# Patient Record
Sex: Female | Born: 1965 | Race: White | Hispanic: No | Marital: Married | Smoking: Never smoker
Health system: Southern US, Community
[De-identification: ages and names within clinical notes are randomized; demographics above are authoritative.]

## PROBLEM LIST (undated history)

## (undated) DIAGNOSIS — G4730 Sleep apnea, unspecified: Secondary | ICD-10-CM

## (undated) DIAGNOSIS — M1990 Unspecified osteoarthritis, unspecified site: Secondary | ICD-10-CM

## (undated) DIAGNOSIS — Z9989 Dependence on other enabling machines and devices: Secondary | ICD-10-CM

## (undated) DIAGNOSIS — E119 Type 2 diabetes mellitus without complications: Secondary | ICD-10-CM

## (undated) DIAGNOSIS — G43909 Migraine, unspecified, not intractable, without status migrainosus: Secondary | ICD-10-CM

## (undated) DIAGNOSIS — F419 Anxiety disorder, unspecified: Secondary | ICD-10-CM

## (undated) DIAGNOSIS — I10 Essential (primary) hypertension: Secondary | ICD-10-CM

## (undated) DIAGNOSIS — R011 Cardiac murmur, unspecified: Secondary | ICD-10-CM

## (undated) DIAGNOSIS — E785 Hyperlipidemia, unspecified: Secondary | ICD-10-CM

## (undated) DIAGNOSIS — G4733 Obstructive sleep apnea (adult) (pediatric): Secondary | ICD-10-CM

## (undated) HISTORY — DX: Sleep apnea, unspecified: G47.30

## (undated) HISTORY — PX: SHOULDER ARTHROSCOPY: SHX128

## (undated) HISTORY — PX: TONSILLECTOMY: SUR1361

## (undated) HISTORY — DX: Cardiac murmur, unspecified: R01.1

## (undated) HISTORY — PX: KNEE ARTHROSCOPY: SHX127

## (undated) HISTORY — PX: NASAL SEPTUM SURGERY: SHX37

## (undated) HISTORY — DX: Morbid (severe) obesity due to excess calories: E66.01

## (undated) HISTORY — DX: Hyperlipidemia, unspecified: E78.5

## (undated) HISTORY — DX: Anxiety disorder, unspecified: F41.9

## (undated) HISTORY — DX: Migraine, unspecified, not intractable, without status migrainosus: G43.909

## (undated) HISTORY — DX: Dependence on other enabling machines and devices: Z99.89

## (undated) HISTORY — DX: Obstructive sleep apnea (adult) (pediatric): G47.33

## (undated) HISTORY — DX: Essential (primary) hypertension: I10

---

## 2001-04-05 HISTORY — PX: COLONOSCOPY: SHX174

## 2001-04-05 HISTORY — PX: UPPER GASTROINTESTINAL ENDOSCOPY: SHX188

## 2012-04-02 ENCOUNTER — Emergency Department (INDEPENDENT_AMBULATORY_CARE_PROVIDER_SITE_OTHER)
Admission: EM | Admit: 2012-04-02 | Discharge: 2012-04-02 | Disposition: A | Discharge status: Home / Self Care | Payer: BC Managed Care – PPO | Source: Home / Self Care | Attending: Emergency Medicine | Admitting: Emergency Medicine

## 2012-04-02 ENCOUNTER — Encounter: Payer: Self-pay | Admitting: *Deleted

## 2012-04-02 ENCOUNTER — Emergency Department (INDEPENDENT_AMBULATORY_CARE_PROVIDER_SITE_OTHER): Payer: BC Managed Care – PPO

## 2012-04-02 DIAGNOSIS — S43402A Unspecified sprain of left shoulder joint, initial encounter: Secondary | ICD-10-CM

## 2012-04-02 DIAGNOSIS — X500XXA Overexertion from strenuous movement or load, initial encounter: Secondary | ICD-10-CM

## 2012-04-02 DIAGNOSIS — IMO0002 Reserved for concepts with insufficient information to code with codable children: Secondary | ICD-10-CM

## 2012-04-02 DIAGNOSIS — M25519 Pain in unspecified shoulder: Secondary | ICD-10-CM

## 2012-04-02 HISTORY — DX: Type 2 diabetes mellitus without complications: E11.9

## 2012-04-02 MED ORDER — HYDROCODONE-ACETAMINOPHEN 5-500 MG PO TABS: ORAL_TABLET | ORAL | Status: DC
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
## 2012-04-02 NOTE — ED Notes (Signed)
Patient c/o left shoulder pain states she injured it yesterday while lifting something heavy.

## 2015-07-24 DIAGNOSIS — H902 Conductive hearing loss, unspecified: Secondary | ICD-10-CM | POA: Diagnosis not present

## 2016-02-19 ENCOUNTER — Encounter: Payer: Self-pay | Admitting: Emergency Medicine

## 2016-02-19 ENCOUNTER — Emergency Department (INDEPENDENT_AMBULATORY_CARE_PROVIDER_SITE_OTHER)
Admission: EM | Admit: 2016-02-19 | Discharge: 2016-02-19 | Disposition: A | Discharge status: Home / Self Care | Payer: BLUE CROSS/BLUE SHIELD | Source: Home / Self Care | Attending: Family Medicine | Admitting: Family Medicine

## 2016-02-19 DIAGNOSIS — M25512 Pain in left shoulder: Secondary | ICD-10-CM | POA: Diagnosis not present

## 2016-02-19 HISTORY — DX: Unspecified osteoarthritis, unspecified site: M19.90

## 2016-02-19 MED ORDER — KETOROLAC TROMETHAMINE 60 MG/2ML IM SOLN
60.0000 mg | Freq: Once | INTRAMUSCULAR | Status: AC
  Administered 2016-02-19: 60 mg via INTRAMUSCULAR

## 2016-02-19 MED ORDER — OXYCODONE-ACETAMINOPHEN 5-325 MG PO TABS: ORAL_TABLET | ORAL | 0 refills | Status: DC

## 2016-02-19 MED ORDER — MELOXICAM 15 MG PO TABS: 15.0000 mg | ORAL_TABLET | Freq: Every day | ORAL | 0 refills | Status: DC

## 2016-02-19 MED ORDER — CYCLOBENZAPRINE HCL 10 MG PO TABS: ORAL_TABLET | ORAL | 0 refills | Status: DC

## 2016-02-19 NOTE — Discharge Instructions (Signed)
Apply ice pack for 20 to 30 minutes, 3 to 4 times daily  Continue until pain and swelling decrease.  Begin pendulum exercises several times daily to maintain range of motion.  Begin more advanced shoulder exercises as tolerated.
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
## 2016-05-20 ENCOUNTER — Encounter: Payer: Self-pay | Admitting: Emergency Medicine

## 2016-05-20 ENCOUNTER — Emergency Department (INDEPENDENT_AMBULATORY_CARE_PROVIDER_SITE_OTHER)
Admission: EM | Admit: 2016-05-20 | Discharge: 2016-05-20 | Disposition: A | Discharge status: Home / Self Care | Payer: BLUE CROSS/BLUE SHIELD | Source: Home / Self Care | Attending: Family Medicine | Admitting: Family Medicine

## 2016-05-20 DIAGNOSIS — J069 Acute upper respiratory infection, unspecified: Secondary | ICD-10-CM | POA: Diagnosis not present

## 2016-05-20 DIAGNOSIS — B9789 Other viral agents as the cause of diseases classified elsewhere: Secondary | ICD-10-CM | POA: Diagnosis not present

## 2016-05-20 DIAGNOSIS — J4521 Mild intermittent asthma with (acute) exacerbation: Secondary | ICD-10-CM

## 2016-05-20 MED ORDER — BENZONATATE 100 MG PO CAPS: 100.0000 mg | ORAL_CAPSULE | Freq: Three times a day (TID) | ORAL | 0 refills | Status: DC

## 2016-05-20 MED ORDER — PREDNISONE 20 MG PO TABS: ORAL_TABLET | ORAL | 0 refills | Status: DC

## 2016-05-20 MED ORDER — ALBUTEROL SULFATE HFA 108 (90 BASE) MCG/ACT IN AERS: 1.0000 | INHALATION_SPRAY | Freq: Four times a day (QID) | RESPIRATORY_TRACT | 0 refills | Status: DC | PRN

## 2016-05-20 NOTE — ED Provider Notes (Signed)
CSN: [id number]     Arrival date & time 05/20/16  1628 History   First MD Initiated Contact with Patient 05/20/16 1708     Chief Complaint  Patient presents with  . Cough  . Nasal Congestion   (Consider location/radiation/quality/duration/timing/severity/associated sxs/prior Treatment) HPI  Morgan Duran is a 51 y.o. female presenting to UC with c/o 1 day of nasal congestion and mildly productive but mainly dry hacking cough.  Reports "low grade fever" but denies body aches, n/v/d. Denies headache, sore throat or ear pain.  Pt notes a friend of hers was sick recently and dx with a URI.  Pt denies hx of asthma but has needed an inhaler when she gets sick with URIs so she did try a leftover inhaler with minimal relief. She also notes she took her husband's leftover amoxicillin from a dental infection but no relief.     Past Medical History:  Diagnosis Date  . Arthritis   . Diabetes mellitus without complication [facility])    Past Surgical History:  Procedure Laterality Date  . KNEE ARTHROSCOPY    . NASAL SEPTUM SURGERY    . SHOULDER ARTHROSCOPY    . TONSILLECTOMY     Family History  Problem Relation Age of Onset  . Emphysema Mother   . Diabetes Father    Social History  Substance Use Topics  . Smoking status: Former Research scientist (life sciences)  . Smokeless tobacco: Never Used  . Alcohol use Yes   OB History    No data available     Review of Systems  Constitutional: Positive for fever ("low grade" ). Negative for chills.  HENT: Positive for congestion. Negative for ear pain, sore throat, trouble swallowing and voice change.   Respiratory: Positive for cough and chest tightness. Negative for shortness of breath and wheezing.   Cardiovascular: Negative for chest pain and palpitations.  Gastrointestinal: Negative for abdominal pain, diarrhea, nausea and vomiting.  Musculoskeletal: Negative for arthralgias, back pain and myalgias.  Skin: Negative for rash.    Allergies  Patient has no known  allergies.  Home Medications   Prior to Admission medications   Medication Sig Start Date End Date Taking? Authorizing Provider  albuterol (PROVENTIL HFA;VENTOLIN HFA) 108 (90 Base) MCG/ACT inhaler Inhale 1-2 puffs into the lungs every 6 (six) hours as needed for wheezing or shortness of breath. 05/20/16   Noland Fordyce, PA-C  benzonatate (TESSALON) 100 MG capsule Take 1-2 capsules (100-200 mg total) by mouth every 8 (eight) hours. 05/20/16   Noland Fordyce, PA-C  cyclobenzaprine (FLEXERIL) 10 MG tablet Take one tab by mouth TID prn muscle spasm 02/19/16   Kandra Nicolas, MD  HYDROcodone-acetaminophen (VICODIN) 5-500 MG per tablet Take 1 or 2 every 4-6 hours as needed for severe pain 04/02/12   Jacqulyn Cane, MD  Insulin Detemir (LEVEMIR FLEXPEN [place]) Inject into the skin.    Historical Provider, MD  L-Methylfolate-B6-B12 (METANX PO) Take by mouth.    Historical Provider, MD  meloxicam (MOBIC) 15 MG tablet Take 1 tablet (15 mg total) by mouth daily. Take with food each morning 02/19/16   Kandra Nicolas, MD  metFORMIN (GLUCOPHAGE) 500 MG tablet Take 500 mg by mouth 2 (two) times daily with a meal.    Historical Provider, MD  metoCLOPramide (REGLAN) 5 MG tablet Take 5 mg by mouth 4 (four) times daily.    Historical Provider, MD  oxyCODONE-acetaminophen (ROXICET) 5-325 MG tablet Take one tab HS prn pain 02/19/16   Kandra Nicolas, MD  predniSONE (  DELTASONE) 20 MG tablet 2 po daily x 3 days 05/20/16   Noland Fordyce, PA-C   Meds Ordered and Administered this Visit  Medications - No data to display  BP 136/84 (BP Location: Left Arm)   Pulse 96   Temp 98 F (36.7 C) (Oral)   Resp 18   Ht 5\' 4"  (1.626 m)   Wt 275 lb (124.7 kg)   LMP 04/29/2016 (Approximate)   SpO2 98%   BMI 47.20 kg/m  No data found.   Physical Exam  Constitutional: She is oriented to person, place, and time. She appears well-developed and well-nourished. No distress.  Obese female sitting up on exam bed, intermittent hacking  cough but no respiratory distress. Cooperative during exam.  HENT:  Head: Normocephalic and atraumatic.  Eyes: EOM are normal.  Neck: Normal range of motion. Neck supple.  Cardiovascular: Normal rate and regular rhythm.   Pulmonary/Chest: Effort normal and breath sounds normal. No stridor. No respiratory distress. She has no wheezes. She has no rales.  Lungs: CTAB but dry hacking cough with deep inspiration.   Musculoskeletal: Normal range of motion.  Lymphadenopathy:    She has no cervical adenopathy.  Neurological: She is alert and oriented to person, place, and time.  Skin: Skin is warm and dry. She is not diaphoretic.  Psychiatric: She has a normal mood and affect. Her behavior is normal.  Nursing note and vitals reviewed.   Urgent Care Course     Procedures (including critical care time)  Labs Review Labs Reviewed - No data to display  Imaging Review No results found.    MDM   1. Viral URI with cough   2. Mild intermittent reactive airway disease with acute exacerbation    Pt c/o cough since yesterday. O2 Sat 98% on RA.  Afebrile. Lungs: CTAB Due to hx of reactive airway, duoneb and decadron given in UC. Pt states she feels mildly improved.  No evidence of underlying bacterial infection at this time. Doubt pneumonia at this time. Doubt influenza given lack of fever and body aches.  Rx: Prednisone 20mg  BID for 3 days, tessalon, and albuterol inhaler.  F/u with PCP next week if not improving. Discussed symptoms that warrant emergent care in the ED.     Noland Fordyce, PA-C 05/20/16 1746

## 2016-05-20 NOTE — ED Triage Notes (Signed)
Patient states congestion and cough started yesterday; has only had low grade fever. Took tylenol at noon today.

## 2016-05-22 ENCOUNTER — Telehealth: Payer: Self-pay | Admitting: Emergency Medicine

## 2016-05-22 NOTE — Telephone Encounter (Signed)
Spoke w/pt, states she is feeling somewhat better, has finished her steroids, still not 100% but giving it some more time.  Encouraged pt to continue to push fluids and rest.  Will f/u prn.  TMartin,CMA

## 2016-05-25 ENCOUNTER — Emergency Department (INDEPENDENT_AMBULATORY_CARE_PROVIDER_SITE_OTHER)
Admission: EM | Admit: 2016-05-25 | Discharge: 2016-05-25 | Disposition: A | Discharge status: Home / Self Care | Payer: BLUE CROSS/BLUE SHIELD | Source: Home / Self Care | Attending: Family Medicine | Admitting: Family Medicine

## 2016-05-25 ENCOUNTER — Encounter: Payer: Self-pay | Admitting: *Deleted

## 2016-05-25 ENCOUNTER — Emergency Department (INDEPENDENT_AMBULATORY_CARE_PROVIDER_SITE_OTHER): Payer: BLUE CROSS/BLUE SHIELD

## 2016-05-25 DIAGNOSIS — F410 Panic disorder [episodic paroxysmal anxiety] without agoraphobia: Secondary | ICD-10-CM

## 2016-05-25 DIAGNOSIS — R05 Cough: Secondary | ICD-10-CM | POA: Diagnosis not present

## 2016-05-25 DIAGNOSIS — R062 Wheezing: Secondary | ICD-10-CM

## 2016-05-25 DIAGNOSIS — J9801 Acute bronchospasm: Secondary | ICD-10-CM | POA: Diagnosis not present

## 2016-05-25 DIAGNOSIS — R0602 Shortness of breath: Secondary | ICD-10-CM

## 2016-05-25 MED ORDER — ALBUTEROL SULFATE HFA 108 (90 BASE) MCG/ACT IN AERS: 1.0000 | INHALATION_SPRAY | Freq: Four times a day (QID) | RESPIRATORY_TRACT | 0 refills | Status: DC | PRN

## 2016-05-25 MED ORDER — PREDNISONE 20 MG PO TABS
ORAL_TABLET | ORAL | 0 refills | Status: DC
Start: 2016-05-25 — End: 2016-05-31

## 2016-05-25 MED ORDER — METHYLPREDNISOLONE SODIUM SUCC 125 MG IJ SOLR
80.0000 mg | Freq: Once | INTRAMUSCULAR | Status: AC
  Administered 2016-05-25: 80 mg via INTRAMUSCULAR

## 2016-05-25 MED ORDER — LORAZEPAM 0.5 MG PO TABS: 0.5000 mg | ORAL_TABLET | Freq: Three times a day (TID) | ORAL | 0 refills | Status: DC | PRN

## 2016-05-25 MED ORDER — AZITHROMYCIN 250 MG PO TABS: 250.0000 mg | ORAL_TABLET | Freq: Every day | ORAL | 0 refills | Status: DC

## 2016-05-25 MED ORDER — IPRATROPIUM-ALBUTEROL 0.5-2.5 (3) MG/3ML IN SOLN
3.0000 mL | Freq: Four times a day (QID) | RESPIRATORY_TRACT | Status: DC
  Administered 2016-05-25: 3 mL via RESPIRATORY_TRACT

## 2016-05-25 NOTE — ED Triage Notes (Signed)
Pt c/o SOB after completing her meds from her last visit.

## 2016-05-25 NOTE — ED Provider Notes (Signed)
CSN: [id number]     Arrival date & time 05/25/16  1345 History   First MD Initiated Contact with Patient 05/25/16 1344     Chief Complaint  Patient presents with  . Shortness of Breath   (Consider location/radiation/quality/duration/timing/severity/associated sxs/prior Treatment) HPI  Morgan Duran is a 51 y.o. female presenting to UC with c/o gradually worsening cough, congestion, chest tightness, and wheeze. Pt was seen on 05/20/16 for 1 day of nasal congestion and mild intermittent cough.  Per medical records, pt had clear lungs but dry cough at the time.  She was discharged with prednisone and inhaler. She has completed the prednisone treatment but tried using the inhaler today w/o relief.  Denies fever. Denies n/v/d.   Pt noted to nursing staff her mother passed about about 2 year ago from stage 4 lung cancer.  Pt notes she had a CXR, which showed the cancer, mother died within 1 week.    PMH notes she is a former smoker. Pt states she only smoked some when she was a teenager.    Past Medical History:  Diagnosis Date  . Arthritis   . Diabetes mellitus without complication [facility])    Past Surgical History:  Procedure Laterality Date  . KNEE ARTHROSCOPY    . NASAL SEPTUM SURGERY    . SHOULDER ARTHROSCOPY    . TONSILLECTOMY     Family History  Problem Relation Age of Onset  . Emphysema Mother   . Diabetes Father    Social History  Substance Use Topics  . Smoking status: Former Research scientist (life sciences)  . Smokeless tobacco: Never Used  . Alcohol use Yes   OB History    No data available     Review of Systems  Constitutional: Negative for chills and fever.  HENT: Positive for congestion. Negative for ear pain, sore throat, trouble swallowing and voice change.   Respiratory: Positive for cough, chest tightness, shortness of breath and wheezing.   Cardiovascular: Negative for chest pain and palpitations.  Gastrointestinal: Negative for abdominal pain, diarrhea, nausea and vomiting.   Musculoskeletal: Negative for arthralgias, back pain and myalgias.  Skin: Negative for rash.  Psychiatric/Behavioral: The patient is nervous/anxious.     Allergies  Patient has no known allergies.  Home Medications   Prior to Admission medications   Medication Sig Start Date End Date Taking? Authorizing Provider  albuterol (PROVENTIL HFA;VENTOLIN HFA) 108 (90 Base) MCG/ACT inhaler Inhale 1-2 puffs into the lungs every 6 (six) hours as needed for wheezing or shortness of breath. 05/20/16   Noland Fordyce, PA-C  albuterol (PROVENTIL HFA;VENTOLIN HFA) 108 (90 Base) MCG/ACT inhaler Inhale 1-2 puffs into the lungs every 6 (six) hours as needed for wheezing or shortness of breath. 05/25/16   Noland Fordyce, PA-C  azithromycin (ZITHROMAX) 250 MG tablet Take 1 tablet (250 mg total) by mouth daily. Take first 2 tablets together, then 1 every day until finished. 05/25/16   Noland Fordyce, PA-C  cyclobenzaprine (FLEXERIL) 10 MG tablet Take one tab by mouth TID prn muscle spasm 02/19/16   Kandra Nicolas, MD  HYDROcodone-acetaminophen (VICODIN) 5-500 MG per tablet Take 1 or 2 every 4-6 hours as needed for severe pain 04/02/12   Jacqulyn Cane, MD  Insulin Detemir (LEVEMIR FLEXPEN [place]) Inject into the skin.    Historical Provider, MD  L-Methylfolate-B6-B12 (METANX PO) Take by mouth.    Historical Provider, MD  LORazepam (ATIVAN) 0.5 MG tablet Take 1 tablet (0.5 mg total) by mouth every 8 (eight) hours as needed for  anxiety. 05/25/16   Noland Fordyce, PA-C  meloxicam (MOBIC) 15 MG tablet Take 1 tablet (15 mg total) by mouth daily. Take with food each morning 02/19/16   Kandra Nicolas, MD  metFORMIN (GLUCOPHAGE) 500 MG tablet Take 500 mg by mouth 2 (two) times daily with a meal.    Historical Provider, MD  metoCLOPramide (REGLAN) 5 MG tablet Take 5 mg by mouth 4 (four) times daily.    Historical Provider, MD  oxyCODONE-acetaminophen (ROXICET) 5-325 MG tablet Take one tab HS prn pain 02/19/16   Kandra Nicolas, MD   predniSONE (DELTASONE) 20 MG tablet 3 tabs po day one, then 2 po daily x 4 days 05/25/16   Noland Fordyce, PA-C   Meds Ordered and Administered this Visit   Medications  ipratropium-albuterol (DUONEB) 0.5-2.5 (3) MG/3ML nebulizer solution 3 mL (3 mLs Nebulization Given 05/25/16 1409)  methylPREDNISolone sodium succinate (SOLU-MEDROL) 125 mg/2 mL injection 80 mg (80 mg Intramuscular Given 05/25/16 1427)    BP 132/85 (BP Location: Left Arm)   Pulse 108   Resp 18   LMP 04/29/2016 (Approximate)   SpO2 97%  No data found.   Physical Exam  Constitutional: She is oriented to person, place, and time. She appears well-developed and well-nourished. She appears distressed.  Tearful, crying and moaning out loud but able to speak in short sentences. Appears anxious.  HENT:  Head: Normocephalic and atraumatic.  Right Ear: Tympanic membrane normal.  Left Ear: Tympanic membrane normal.  Nose: Nose normal.  Mouth/Throat: Uvula is midline, oropharynx is clear and moist and mucous membranes are normal.  Eyes: EOM are normal.  Neck: Normal range of motion. Neck supple.  Cardiovascular: Normal rate and regular rhythm.   Pulmonary/Chest: Effort normal. No stridor. Tachypnea noted. She has wheezes. She has rhonchi.  Musculoskeletal: Normal range of motion.  Lymphadenopathy:    She has no cervical adenopathy.  Neurological: She is alert and oriented to person, place, and time.  Skin: Skin is warm and dry. She is not diaphoretic.  Psychiatric: Her behavior is normal. Her mood appears anxious ( crying).  Nursing note and vitals reviewed.   Urgent Care Course     Procedures (including critical care time)  Labs Review Labs Reviewed - No data to display  Imaging Review Dg Chest 2 View  Result Date: 05/25/2016 CLINICAL DATA:  Patient was seen 05/20/16 for SOB, congestion and cough and given meds. States she had severe episode of SOB and wheezing today. EXAM: CHEST  2 VIEW COMPARISON:  None.  FINDINGS: Normal mediastinum and cardiac silhouette. Normal pulmonary vasculature. No evidence of effusion, infiltrate, or pneumothorax. No acute bony abnormality. IMPRESSION: No acute cardiopulmonary process. Electronically Signed   By: Suzy Bouchard M.D.   On: 05/25/2016 14:38     MDM   1. Acute bronchospasm   2. Anxiety attack    Pt c/o SOB. Appears to be having an anxiety attack, however, wheeze and rhonchi also noted on exam. O2 Sat on RA 98-100%.   CXR: no acute findings.  BP and HR improved in UC after duoneb, Solumedrol, and rest.  Due to initial onset of symptoms with cough and congestion 1 week ago, will cover for atypical bacteria as well. Rx: Prednisone, azithromycin, albuterol inhaler and ativan 0.5mg  (6 tabs) encouraged to keep note of when/fi she takes and if any benefit to discuss during PCP f/u Monday 05/31/16.      Noland Fordyce, PA-C 05/25/16 [phone]

## 2016-05-25 NOTE — Discharge Instructions (Signed)
°  Ativan (lorazepam) is an anti-anxiety medication that you can take up to 3 times daily as needed for anxiety. Do not take more than prescribed. Do not drink alcohol while taking. Do not drive while taking as it can cause drowsiness.  Please let your primary care provider know on Monday if you have tried this new medication, what caused your anxiety to worsen and if the medication helped or not.  This will help guide her to make any adjustments to your overall treatment plan.

## 2016-05-26 ENCOUNTER — Telehealth: Payer: Self-pay | Admitting: Emergency Medicine

## 2016-05-26 NOTE — Telephone Encounter (Signed)
Inquired about patient's status; encourage them to call with questions/concerns.  

## 2016-05-31 ENCOUNTER — Ambulatory Visit (INDEPENDENT_AMBULATORY_CARE_PROVIDER_SITE_OTHER): Payer: BLUE CROSS/BLUE SHIELD | Admitting: Physician Assistant

## 2016-05-31 ENCOUNTER — Encounter: Payer: Self-pay | Admitting: Physician Assistant

## 2016-05-31 VITALS — BP 132/92 | HR 105 | Wt 262.0 lb

## 2016-05-31 DIAGNOSIS — E785 Hyperlipidemia, unspecified: Secondary | ICD-10-CM | POA: Diagnosis not present

## 2016-05-31 DIAGNOSIS — Z794 Long term (current) use of insulin: Secondary | ICD-10-CM

## 2016-05-31 DIAGNOSIS — N921 Excessive and frequent menstruation with irregular cycle: Secondary | ICD-10-CM | POA: Insufficient documentation

## 2016-05-31 DIAGNOSIS — E1169 Type 2 diabetes mellitus with other specified complication: Secondary | ICD-10-CM | POA: Diagnosis not present

## 2016-05-31 DIAGNOSIS — E1165 Type 2 diabetes mellitus with hyperglycemia: Secondary | ICD-10-CM | POA: Insufficient documentation

## 2016-05-31 DIAGNOSIS — F4322 Adjustment disorder with anxiety: Secondary | ICD-10-CM | POA: Diagnosis not present

## 2016-05-31 DIAGNOSIS — Z0000 Encounter for general adult medical examination without abnormal findings: Secondary | ICD-10-CM | POA: Diagnosis not present

## 2016-05-31 LAB — LIPID PANEL W/REFLEX DIRECT LDL
CHOL/HDL RATIO: 4.7 ratio (ref <5.0)
CHOLESTEROL: 200 mg/dL — AB (ref <200)
HDL: 43 mg/dL — AB (ref >50)
LDL-Cholesterol: 129 mg/dL — ABNORMAL HIGH
NON-HDL CHOLESTEROL (CALC): 157 mg/dL — AB (ref <130)
TRIGLYCERIDES: 165 mg/dL — AB (ref <150)

## 2016-05-31 LAB — COMPREHENSIVE METABOLIC PANEL
ALT: 30 U/L — ABNORMAL HIGH (ref 6–29)
AST: 17 U/L (ref 10–35)
Albumin: 3.8 g/dL (ref 3.6–5.1)
Alkaline Phosphatase: 68 U/L (ref 33–115)
BILIRUBIN TOTAL: 0.6 mg/dL (ref 0.2–1.2)
BUN: 21 mg/dL (ref 7–25)
CALCIUM: 9.2 mg/dL (ref 8.6–10.2)
CHLORIDE: 97 mmol/L — AB (ref 98–110)
CO2: 26 mmol/L (ref 20–31)
Creat: 0.78 mg/dL (ref 0.50–1.10)
GLUCOSE: 385 mg/dL — AB (ref 65–99)
POTASSIUM: 4.1 mmol/L (ref 3.5–5.3)
Sodium: 132 mmol/L — ABNORMAL LOW (ref 135–146)
Total Protein: 6.5 g/dL (ref 6.1–8.1)

## 2016-05-31 LAB — CBC
HEMATOCRIT: 39.9 % (ref 35.0–45.0)
Hemoglobin: 13.1 g/dL (ref 11.7–15.5)
MCH: 28.9 pg (ref 27.0–33.0)
MCHC: 32.8 g/dL (ref 32.0–36.0)
MCV: 88.1 fL (ref 80.0–100.0)
MPV: 9.7 fL (ref 7.5–12.5)
Platelets: 343 10*3/uL (ref 140–400)
RBC: 4.53 MIL/uL (ref 3.80–5.10)
RDW: 13.3 % (ref 11.0–15.0)
WBC: 11.6 10*3/uL — AB (ref 3.8–10.8)

## 2016-05-31 LAB — TSH: TSH: 1.58 mIU/L

## 2016-05-31 LAB — HEMOGLOBIN A1C
Hgb A1c MFr Bld: 11.1 % — ABNORMAL HIGH (ref <5.7)
MEAN PLASMA GLUCOSE: 272 mg/dL

## 2016-05-31 LAB — T4, FREE: Free T4: 1.2 ng/dL (ref 0.8–1.8)

## 2016-05-31 MED ORDER — LORAZEPAM 0.5 MG PO TABS: 0.5000 mg | ORAL_TABLET | Freq: Three times a day (TID) | ORAL | 0 refills | Status: DC | PRN

## 2016-05-31 NOTE — Progress Notes (Signed)
HPI:                                                                Morgan Duran is a 51 y.o. female who presents to [facility] today to establish care   Current Concerns include anxiety, menopause  Patient reports that she has been having anxiety and panic attacks for the last 2 weeks. This is a new problem for her. She states that it has been triggered by her daughter's recent autism diagnosis. Her daughter's performance in school has dropped and she feels helpless despite efforts to help her keep up with the workload. She denies anhedonia or depressed mood. She denies suicidal thinking. She was seen in urgent care over the weekend and prescribed Ativan. She states this is helping.   States she is having 2 periods per month x 7 days each. She states her mother went through early menopause. She is wondering if her hormones are contributing to her anxiety.   DMII: prescribed Levemir and Metformin, but not currently taking either. She states she does not want to be dependent on medications, and she has recently started dieting and exercising. Checks blood sugars at home. Blood sugar range in the 300's. Denies polyuria, vision problems, and lightheadness. She does endorse peripheral neuropathy. Denies open sores/wounds on her feet. Denies hypoglycemic events.    Health Maintenance Health Maintenance  Topic Date Due  . HIV Screening  08/07/1981  . TETANUS/TDAP  08/07/1985  . PAP SMEAR  08/08/1987  . INFLUENZA VACCINE  11/04/2015    Past Medical History:  Diagnosis Date  . Arthritis   . Diabetes mellitus without complication [facility])    Past Surgical History:  Procedure Laterality Date  . KNEE ARTHROSCOPY    . NASAL SEPTUM SURGERY    . SHOULDER ARTHROSCOPY    . TONSILLECTOMY     Social History  Substance Use Topics  . Smoking status: Former Research scientist (life sciences)  . Smokeless tobacco: Never Used  . Alcohol use Yes   family history includes  Diabetes in her father; Emphysema in her mother.  ROS: negative except as noted in the HPI  Medications: Current Outpatient Prescriptions  Medication Sig Dispense Refill  . azithromycin (ZITHROMAX) 250 MG tablet Take 1 tablet (250 mg total) by mouth daily. Take first 2 tablets together, then 1 every day until finished. 6 tablet 0  . LORazepam (ATIVAN) 0.5 MG tablet Take 1 tablet (0.5 mg total) by mouth every 8 (eight) hours as needed for anxiety. 6 tablet 0  . metFORMIN (GLUCOPHAGE) 500 MG tablet Take 500 mg by mouth 2 (two) times daily with a meal.    . predniSONE (DELTASONE) 20 MG tablet 3 tabs po day one, then 2 po daily x 4 days 11 tablet 0  . Insulin Detemir (LEVEMIR FLEXPEN [place]) Inject into the skin.    Marland Kitchen L-Methylfolate-B6-B12 (METANX PO) Take by mouth.     No current facility-administered medications for this visit.    No Known Allergies     Objective:  BP (!) 132/92   Pulse (!) 105   Wt 262 lb (118.8 kg)   BMI 44.97 kg/m  Gen: well-groomed, cooperative, obese, not ill-appearing, no acute distress Pulm: Normal work of breathing, clear to auscultation  bilaterally CV: Tachycardic, regular rhythm, s1 and s2 distinct, no murmurs, clicks or rubs appreciated on this exam, no carotid bruit Neuro: alert and oriented x 3, EOM's intact MSK: normal gait and station, no peripheral edema Skin: warm and dry, no rashes or lesions on exposed skin Psych: anxious affect, very tearful throughout the visit, normal speech and thought content   No results found for this or any previous visit (from the past 72 hour(s)). No results found.    Assessment and Plan: 51 y.o. female with   1. Encounter for preventative adult health care examination - CBC - Comprehensive metabolic panel - Hemoglobin A1c - Lipid Panel w/reflex Direct LDL   2. Metrorrhagia - Follicle stimulating hormone - Luteinizing hormone - hCG, quantitative, pregnancy - Prolactin  3. Adjustment disorder with anxious  mood - patient declined to complete PHQ9/GAD. Declined SSRI therapy - TSH - T4, free - LORazepam (ATIVAN) 0.5 MG tablet; Take 1 tablet (0.5 mg total) by mouth every 8 (eight) hours as needed for anxiety.  Dispense: 30 tablet; Refill: 0 - Ambulatory referral to Psychiatry  4. Type 2 diabetes mellitus with hyperglycemia, with long-term current use of insulin (HCC) - restart Metformin 500mg  bid - discontinue prednisone - follow-up in 3 weeks   No orders of the defined types were placed in this encounter.    Patient education and anticipatory guidance given Patient agrees with treatment plan Follow-up in 3 weeks or sooner as needed  Darlyne Russian PA-C

## 2016-05-31 NOTE — Patient Instructions (Addendum)
Stop your prednisone Re-start your Metformin I've placed a referral for counseling downstairs. They will contact you shortly Come back to see me in the next 2-3 weeks for your diabetes follow-up

## 2016-06-01 LAB — HCG, QUANTITATIVE, PREGNANCY: hCG, Beta Chain, Quant, S: <2 m[IU]/mL

## 2016-06-01 LAB — LUTEINIZING HORMONE: LH: 2.3 m[IU]/mL

## 2016-06-01 LAB — FOLLICLE STIMULATING HORMONE: FSH: 1.6 m[IU]/mL

## 2016-06-01 LAB — PROLACTIN: Prolactin: 7.1 ng/mL

## 2016-06-02 MED ORDER — DAPAGLIFLOZIN PRO-METFORMIN ER 10-1000 MG PO TB24: 10.0000 mg | ORAL_TABLET | Freq: Every day | ORAL | 3 refills | Status: DC

## 2016-06-02 MED ORDER — ATORVASTATIN CALCIUM 20 MG PO TABS: 20.0000 mg | ORAL_TABLET | Freq: Every day | ORAL | 3 refills | Status: DC

## 2016-06-02 MED ORDER — EXENATIDE ER 2 MG/0.85ML ~~LOC~~ AUIJ: 2.0000 mg | AUTO-INJECTOR | SUBCUTANEOUS | 0 refills | Status: DC

## 2016-06-02 NOTE — Progress Notes (Signed)
Patient's labs show pseudohypernatremia and hyperglycemia. Need for better glucose control.  Lab Results  Component Value Date   NA 132 (L) 05/31/2016   K 4.1 05/31/2016   CL 97 (L) 05/31/2016   CO2 26 05/31/2016    Starting Xigduo daily and Bydureon weekly. Follow-up within the month.

## 2016-06-02 NOTE — Addendum Note (Signed)
Addended by: Nelson Chimes E on: 06/02/2016 12:15 PM   Modules accepted: Orders

## 2016-06-02 NOTE — Addendum Note (Signed)
Addended by: Nelson Chimes E on: 06/02/2016 01:09 PM   Modules accepted: Orders

## 2016-06-03 ENCOUNTER — Telehealth: Payer: Self-pay

## 2016-06-03 NOTE — Telephone Encounter (Signed)
Pre Authorization was sent to Cover My Meds. Key: [id number]. Called pharmacy and Rx is ready for pick up. Pt informed. Pt stated that she already has metformin 500 mg 2 x daily and Insulin. Pt stated she was gonna take what she already had because she did not want to waste money. Pt advised to take what she was prescribed by the provider. Pt stated she was just going to use what she had.

## 2016-06-03 NOTE — Progress Notes (Signed)
She is not in menopause. Her FSH, Lh, prolactin, and beta-hcg are all within normal limits. I recommend she call [facility] and see if she can get an appointment this week for counseling [phone]

## 2016-06-04 ENCOUNTER — Other Ambulatory Visit: Payer: Self-pay

## 2016-06-04 DIAGNOSIS — E1169 Type 2 diabetes mellitus with other specified complication: Secondary | ICD-10-CM

## 2016-06-04 DIAGNOSIS — E785 Hyperlipidemia, unspecified: Principal | ICD-10-CM

## 2016-06-04 MED ORDER — ATORVASTATIN CALCIUM 20 MG PO TABS: 20.0000 mg | ORAL_TABLET | Freq: Every day | ORAL | 3 refills | Status: DC

## 2016-06-04 NOTE — Telephone Encounter (Signed)
That's fine. Please let her know she still needs to see the diabetes educator because her blood sugar is over 300 and her A1C is over 11. She should make a nurse visit to make sure she knows how to use her insulin properly.

## 2016-06-04 NOTE — Telephone Encounter (Signed)
Pt notified of recommendations below.  She verbalized understanding and was transferred to scheduling.

## 2016-06-11 ENCOUNTER — Ambulatory Visit: Payer: BLUE CROSS/BLUE SHIELD

## 2016-06-21 ENCOUNTER — Ambulatory Visit: Payer: BLUE CROSS/BLUE SHIELD | Admitting: Physician Assistant

## 2016-06-29 ENCOUNTER — Ambulatory Visit (HOSPITAL_COMMUNITY): Payer: BLUE CROSS/BLUE SHIELD | Admitting: Psychiatry

## 2016-09-16 ENCOUNTER — Telehealth: Payer: Self-pay

## 2016-09-16 NOTE — Telephone Encounter (Signed)
-----   Message from [facility] [place] sent at 09/15/2016  4:53 PM EDT ----- For Diabetes

## 2016-09-16 NOTE — Telephone Encounter (Signed)
Left vm with recommendations -EH/RMA

## 2016-10-04 ENCOUNTER — Ambulatory Visit (INDEPENDENT_AMBULATORY_CARE_PROVIDER_SITE_OTHER): Payer: BLUE CROSS/BLUE SHIELD | Admitting: Physician Assistant

## 2016-10-04 ENCOUNTER — Encounter: Payer: Self-pay | Admitting: Physician Assistant

## 2016-10-04 VITALS — BP 136/87 | HR 80 | Temp 98.3°F | Ht 64.0 in | Wt 270.0 lb

## 2016-10-04 DIAGNOSIS — Z794 Long term (current) use of insulin: Secondary | ICD-10-CM

## 2016-10-04 DIAGNOSIS — Z0189 Encounter for other specified special examinations: Secondary | ICD-10-CM

## 2016-10-04 DIAGNOSIS — G43009 Migraine without aura, not intractable, without status migrainosus: Secondary | ICD-10-CM

## 2016-10-04 DIAGNOSIS — Z9114 Patient's other noncompliance with medication regimen: Secondary | ICD-10-CM

## 2016-10-04 DIAGNOSIS — E1165 Type 2 diabetes mellitus with hyperglycemia: Secondary | ICD-10-CM

## 2016-10-04 LAB — POCT URINALYSIS DIPSTICK
Bilirubin, UA: NEGATIVE
Blood, UA: NEGATIVE
Glucose, UA: >1000
Ketones, UA: NEGATIVE
LEUKOCYTES UA: NEGATIVE
NITRITE UA: NEGATIVE
PH UA: 5.5 (ref 5.0–8.0)
PROTEIN UA: NEGATIVE
Spec Grav, UA: 1.015 (ref 1.010–1.025)
Urobilinogen, UA: 0.2 E.U./dL

## 2016-10-04 LAB — COMPLETE METABOLIC PANEL WITH GFR
ALT: 25 U/L (ref 6–29)
AST: 17 U/L (ref 10–35)
Albumin: 4 g/dL (ref 3.6–5.1)
Alkaline Phosphatase: 91 U/L (ref 33–130)
BUN: 14 mg/dL (ref 7–25)
CO2: 26 mmol/L (ref 20–31)
CREATININE: 0.62 mg/dL (ref 0.50–1.05)
Calcium: 9.3 mg/dL (ref 8.6–10.4)
Chloride: 100 mmol/L (ref 98–110)
GFR, Est African American: >89 mL/min (ref >=60)
GFR, Est Non African American: >89 mL/min (ref >=60)
Glucose, Bld: 219 mg/dL — ABNORMAL HIGH (ref 65–99)
Potassium: 4.2 mmol/L (ref 3.5–5.3)
Sodium: 135 mmol/L (ref 135–146)
Total Bilirubin: 0.4 mg/dL (ref 0.2–1.2)
Total Protein: 6.7 g/dL (ref 6.1–8.1)

## 2016-10-04 MED ORDER — METOCLOPRAMIDE HCL 5 MG/ML IJ SOLN
10.0000 mg | Freq: Once | INTRAMUSCULAR | Status: AC
  Administered 2016-10-04: 10 mg via INTRAMUSCULAR

## 2016-10-04 MED ORDER — DEXAMETHASONE SODIUM PHOSPHATE 4 MG/ML IJ SOLN
4.0000 mg | Freq: Once | INTRAMUSCULAR | Status: AC
  Administered 2016-10-04: 4 mg via INTRAMUSCULAR

## 2016-10-04 MED ORDER — METOCLOPRAMIDE HCL 10 MG PO TABS: ORAL_TABLET | ORAL | 0 refills | Status: DC

## 2016-10-04 MED ORDER — RIZATRIPTAN BENZOATE 5 MG PO TBDP: 5.0000 mg | ORAL_TABLET | ORAL | 0 refills | Status: DC | PRN

## 2016-10-04 NOTE — Progress Notes (Signed)
HPI:                                                                Morgan Duran is a 51 y.o. female who presents to [facility] [place] today for headache  Onset: reports 2 last week, 1 Saturday and latest headache today Location: frontal behind her eyes Duration: waxing and waning Character: throbbing Associated symptoms: photophobia, sensitivity to smell, fatigue, nausea Relieving factors: sleep helps Treatments tried: nothing Reports she has been on Reglan in the past that was prescribed by a neurologist in [place]. She states she also tried anti-seizure medication, Imitrex and that did not work.   Patient is also "concerned about my kidneys" and requested a urinalysis. Denies flank pain, hematuria, frequency, urgency.  Past Medical History:  Diagnosis Date  . Arthritis   . Diabetes mellitus without complication [facility])    Past Surgical History:  Procedure Laterality Date  . KNEE ARTHROSCOPY    . NASAL SEPTUM SURGERY    . SHOULDER ARTHROSCOPY    . TONSILLECTOMY     Social History  Substance Use Topics  . Smoking status: Former Research scientist (life sciences)  . Smokeless tobacco: Never Used  . Alcohol use Yes   family history includes Diabetes in her father; Emphysema in her mother.  ROS: negative except as noted in the HPI  Medications: Current Outpatient Prescriptions  Medication Sig Dispense Refill  . atorvastatin (LIPITOR) 20 MG tablet Take 1 tablet (20 mg total) by mouth daily. 90 tablet 3  . azithromycin (ZITHROMAX) 250 MG tablet Take 1 tablet (250 mg total) by mouth daily. Take first 2 tablets together, then 1 every day until finished. 6 tablet 0  . Dapagliflozin-Metformin HCl ER (XIGDUO XR) 01-999 MG TB24 Take 10-1,000 mg by mouth daily. 30 tablet 3  . Exenatide ER (BYDUREON BCISE) 2 MG/0.85ML AUIJ Inject 2 mg into the skin once a week. 4 pen 0  . Insulin Detemir (LEVEMIR FLEXPEN [place]) Inject into the skin.    Marland Kitchen L-Methylfolate-B6-B12 (METANX PO)  Take by mouth.    Marland Kitchen LORazepam (ATIVAN) 0.5 MG tablet Take 1 tablet (0.5 mg total) by mouth every 8 (eight) hours as needed for anxiety. 30 tablet 0   No current facility-administered medications for this visit.    No Known Allergies     Objective:  BP 136/87   Pulse 80   Ht 5\' 4"  (1.626 m)   Wt 270 lb (122.5 kg)   BMI 46.35 kg/m  Gen: well-groomed, not ill-appearing, morbidly obese, no acute distress HEENT: head normocephalic, atraumatic; normal conjunctiva Pulm: Normal work of breathing, normal phonation, clear to auscultation bilaterally CV: Normal rate, regular rhythm, s1 and s2 distinct, no murmurs, clicks or rubs Neuro:  cranial nerves II-XII intact, no nystagmus, no papilledema, normal finger-to-nose, normal heel-to-shin, negative pronator drift, normal coordination, DTR's intact, normal tone, no tremor MSK: strength 5/5 and symmetric in bilateral upper and lower extremities, normal gait and station Mental Status: alert and oriented x 3, normal speech, cooperative, organized thought content   Results for orders placed or performed in visit on 10/04/16 (from the past 72 hour(s))  POCT Urinalysis Dipstick     Status: None   Collection Time: 10/04/16  3:05 PM  Result Value Ref Range  Color, UA YELLOW    Clarity, UA CLEAR    Glucose, UA >1,000    Bilirubin, UA NEGATIVE    Ketones, UA NEGATIVE    Spec Grav, UA 1.015 1.010 - 1.025   Blood, UA NEGATIVE    pH, UA 5.5 5.0 - 8.0   Protein, UA NEGATIVE    Urobilinogen, UA 0.2 0.2 or 1.0 E.U./dL   Nitrite, UA NEGATIVE    Leukocytes, UA Negative Negative   No results found.    Assessment and Plan: 51 y.o. female with   1. Patient request for diagnostic testing - POCT Urinalysis Dipstick negative  2. Migraine without aura and without status migrainosus, not intractable - reassuring neurologic exam, no focal deficits - patient given IM Decadron and Reglan with improvement of pain - patient given Maxalt and Reglan PO  for abortive therapy - will follow-up with Neurology - rizatriptan (MAXALT-MLT) 5 MG disintegrating tablet; Take 1 tablet (5 mg total) by mouth as needed for migraine. May repeat in 2 hours if needed  Dispense: 10 tablet; Refill: 0 - dexamethasone (DECADRON) injection 4 mg; Inject 1 mL (4 mg total) into the muscle once. - metoCLOPramide (REGLAN) 10 mg in dextrose 5 % 50 mL IVPB; Inject 10 mg into the muscle once.  3. Uncontrolled Type 2 diabetes mellitus with hyperglycemia, with long-term current use of insulin (HCC) - uncontrolled secondary to patient noncompliance. She did not fill Bydureon or Xigduo.  - COMPLETE METABOLIC PANEL WITH GFR - Hemoglobin A1c  Patient education and anticipatory guidance given Patient agrees with treatment plan Follow-up as needed if symptoms worsen or fail to improve  Darlyne Russian PA-C

## 2016-10-04 NOTE — Patient Instructions (Addendum)
- Reglan 1 tab every 8 hours as needed for nausea/migraine - Dissolve 1 Maxalt under the tongue at the first sign of headache. May repeat once if headache does not resolve or returns. - Follow-up with Headache clinic - Return or go to the ER for new or worsening symptoms    Migraine Headache A migraine headache is an intense, throbbing pain on one side or both sides of the head. Migraines may also cause other symptoms, such as nausea, vomiting, and sensitivity to light and noise. What are the causes? Doing or taking certain things may also trigger migraines, such as:  Alcohol.  Smoking.  Medicines, such as: ? Medicine used to treat chest pain (nitroglycerine). ? Birth control pills. ? Estrogen pills. ? Certain blood pressure medicines.  Aged cheeses, chocolate, or caffeine.  Foods or drinks that contain nitrates, glutamate, aspartame, or tyramine.  Physical activity.  Other things that may trigger a migraine include:  Menstruation.  Pregnancy.  Hunger.  Stress, lack of sleep, too much sleep, or fatigue.  Weather changes.  What increases the risk? The following factors may make you more likely to experience migraine headaches:  Age. Risk increases with age.  Family history of migraine headaches.  Being Caucasian.  Depression and anxiety.  Obesity.  Being a woman.  Having a hole in the heart (patent foramen ovale) or other heart problems.  What are the signs or symptoms? The main symptom of this condition is pulsating or throbbing pain. Pain may:  Happen in any area of the head, such as on one side or both sides.  Interfere with daily activities.  Get worse with physical activity.  Get worse with exposure to bright lights or loud noises.  Other symptoms may include:  Nausea.  Vomiting.  Dizziness.  General sensitivity to bright lights, loud noises, or smells.  Before you get a migraine, you may get warning signs that a migraine is developing  (aura). An aura may include:  Seeing flashing lights or having blind spots.  Seeing bright spots, halos, or zigzag lines.  Having tunnel vision or blurred vision.  Having numbness or a tingling feeling.  Having trouble talking.  Having muscle weakness.  How is this diagnosed? A migraine headache can be diagnosed based on:  Your symptoms.  A physical exam.  Tests, such as CT scan or MRI of the head. These imaging tests can help rule out other causes of headaches.  Taking fluid from the spine (lumbar puncture) and analyzing it (cerebrospinal fluid analysis, or CSF analysis).  How is this treated? A migraine headache is usually treated with medicines that:  Relieve pain.  Relieve nausea.  Prevent migraines from coming back.  Treatment may also include:  Acupuncture.  Lifestyle changes like avoiding foods that trigger migraines.  Follow these instructions at home: Medicines  Take over-the-counter and prescription medicines only as told by your health care provider.  Do not drive or use heavy machinery while taking prescription pain medicine.  To prevent or treat constipation while you are taking prescription pain medicine, your health care provider may recommend that you: ? Drink enough fluid to keep your urine clear or pale yellow. ? Take over-the-counter or prescription medicines. ? Eat foods that are high in fiber, such as fresh fruits and vegetables, whole grains, and beans. ? Limit foods that are high in fat and processed sugars, such as fried and sweet foods. Lifestyle  Avoid alcohol use.  Do not use any products that contain nicotine or tobacco,  such as cigarettes and e-cigarettes. If you need help quitting, ask your health care provider.  Get at least 8 hours of sleep every night.  Limit your stress. General instructions   Keep a journal to find out what may trigger your migraine headaches. For example, write down: ? What you eat and drink. ? How  much sleep you get. ? Any change to your diet or medicines.  If you have a migraine: ? Avoid things that make your symptoms worse, such as bright lights. ? It may help to lie down in a dark, quiet room. ? Do not drive or use heavy machinery. ? Ask your health care provider what activities are safe for you while you are experiencing symptoms.  Keep all follow-up visits as told by your health care provider. This is important. Contact a health care provider if:  You develop symptoms that are different or more severe than your usual migraine symptoms. Get help right away if:  Your migraine becomes severe.  You have a fever.  You have a stiff neck.  You have vision loss.  Your muscles feel weak or like you cannot control them.  You start to lose your balance often.  You develop trouble walking.  You faint. This information is not intended to replace advice given to you by your health care provider. Make sure you discuss any questions you have with your health care provider. Document Released: 03/22/2005 Document Revised: 10/10/2015 Document Reviewed: 09/08/2015 Elsevier Interactive Patient Education  2017 [employer].

## 2016-10-05 LAB — HEMOGLOBIN A1C
Hgb A1c MFr Bld: 10.2 % — ABNORMAL HIGH (ref <5.7)
Mean Plasma Glucose: 246 mg/dL

## 2016-10-05 NOTE — Progress Notes (Signed)
A1C is a little better, but diabetes is still uncontrolled Please let patient know it's very important that she schedule a follow-up appointment with me for her diabetes If she does not follow-up in 3 months, unfortunately I am not going to continue to be able to be her PCP

## 2016-10-10 DIAGNOSIS — Z9114 Patient's other noncompliance with medication regimen: Secondary | ICD-10-CM | POA: Insufficient documentation

## 2017-01-07 ENCOUNTER — Ambulatory Visit (INDEPENDENT_AMBULATORY_CARE_PROVIDER_SITE_OTHER): Payer: BLUE CROSS/BLUE SHIELD | Admitting: Physician Assistant

## 2017-01-07 ENCOUNTER — Encounter: Payer: Self-pay | Admitting: Physician Assistant

## 2017-01-07 VITALS — BP 116/77 | HR 72 | Wt 263.0 lb

## 2017-01-07 DIAGNOSIS — E119 Type 2 diabetes mellitus without complications: Secondary | ICD-10-CM | POA: Diagnosis not present

## 2017-01-07 DIAGNOSIS — E785 Hyperlipidemia, unspecified: Secondary | ICD-10-CM | POA: Diagnosis not present

## 2017-01-07 DIAGNOSIS — Z794 Long term (current) use of insulin: Secondary | ICD-10-CM | POA: Diagnosis not present

## 2017-01-07 DIAGNOSIS — E1169 Type 2 diabetes mellitus with other specified complication: Secondary | ICD-10-CM | POA: Diagnosis not present

## 2017-01-07 DIAGNOSIS — R011 Cardiac murmur, unspecified: Secondary | ICD-10-CM

## 2017-01-07 LAB — POCT GLYCOSYLATED HEMOGLOBIN (HGB A1C): Hemoglobin A1C: 7

## 2017-01-07 LAB — POCT UA - MICROALBUMIN
Albumin/Creatinine Ratio, Urine, POC: <30
Creatinine, POC: 200 mg/dL
Microalbumin Ur, POC: 80 mg/L

## 2017-01-07 MED ORDER — ATORVASTATIN CALCIUM 20 MG PO TABS: 20.0000 mg | ORAL_TABLET | Freq: Every day | ORAL | 3 refills | Status: DC

## 2017-01-07 MED ORDER — DAPAGLIFLOZIN PROPANEDIOL 5 MG PO TABS: 5.0000 mg | ORAL_TABLET | Freq: Every day | ORAL | 0 refills | Status: DC

## 2017-01-07 MED ORDER — ASPIRIN EC 81 MG PO TBEC: 81.0000 mg | DELAYED_RELEASE_TABLET | Freq: Every day | ORAL | 3 refills | Status: DC

## 2017-01-07 MED ORDER — METFORMIN HCL 1000 MG PO TABS: 1000.0000 mg | ORAL_TABLET | Freq: Two times a day (BID) | ORAL | 0 refills | Status: DC

## 2017-01-07 NOTE — Patient Instructions (Addendum)
- Make an appointment with the eye doctor for a dilated diabetic eye exam - Start baby aspirin daily - Start taking your Atorvastatin at bedtime for your cholesterol - Continue Metformin 1000 mg twice a day - Start Farxiga (dapagliflozin) once daily for blood sugar. Contact me if you develop burning when you pee or vaginal discomfort/discharge - Consider your pneumonia and flu vaccines - Limit carbohydrates to 30g at meals and 15g for snacks - Follow-up in 3 months   Health Maintenance due - Mammogram - Pap smear - Colonoscopy   Carbohydrate Counting for Diabetes Mellitus, Adult Carbohydrate counting is a method for keeping track of how many carbohydrates you eat. Eating carbohydrates naturally increases the amount of sugar (glucose) in the blood. Counting how many carbohydrates you eat helps keep your blood glucose within normal limits, which helps you manage your diabetes (diabetes mellitus). It is important to know how many carbohydrates you can safely have in each meal. This is different for every person. A diet and nutrition specialist (registered dietitian) can help you make a meal plan and calculate how many carbohydrates you should have at each meal and snack. Carbohydrates are found in the following foods:  Grains, such as breads and cereals.  Dried beans and soy products.  Starchy vegetables, such as potatoes, peas, and corn.  Fruit and fruit juices.  Milk and yogurt.  Sweets and snack foods, such as cake, cookies, candy, chips, and soft drinks.  How do I count carbohydrates? There are two ways to count carbohydrates in food. You can use either of the methods or a combination of both. Reading "Nutrition Facts" on packaged food The "Nutrition Facts" list is included on the labels of almost all packaged foods and beverages in the [place] It includes:  The serving size.  Information about nutrients in each serving, including the grams (g) of carbohydrate per serving.  To  use the "Nutrition Facts":  Decide how many servings you will have.  Multiply the number of servings by the number of carbohydrates per serving.  The resulting number is the total amount of carbohydrates that you will be having.  Learning standard serving sizes of other foods When you eat foods containing carbohydrates that are not packaged or do not include "Nutrition Facts" on the label, you need to measure the servings in order to count the amount of carbohydrates:  Measure the foods that you will eat with a food scale or measuring cup, if needed.  Decide how many standard-size servings you will eat.  Multiply the number of servings by 15. Most carbohydrate-rich foods have about 15 g of carbohydrates per serving. ? For example, if you eat 8 oz (170 g) of strawberries, you will have eaten 2 servings and 30 g of carbohydrates (2 servings x 15 g = 30 g).  For foods that have more than one food mixed, such as soups and casseroles, you must count the carbohydrates in each food that is included.  The following list contains standard serving sizes of common carbohydrate-rich foods. Each of these servings has about 15 g of carbohydrates:   hamburger bun or  English muffin.   oz (15 mL) syrup.   oz (14 g) jelly.  1 slice of bread.  1 six-inch tortilla.  3 oz (85 g) cooked rice or pasta.  4 oz (113 g) cooked dried beans.  4 oz (113 g) starchy vegetable, such as peas, corn, or potatoes.  4 oz (113 g) hot cereal.  4 oz (113  g) mashed potatoes or  of a large baked potato.  4 oz (113 g) canned or frozen fruit.  4 oz (120 mL) fruit juice.  4-6 crackers.  6 chicken nuggets.  6 oz (170 g) unsweetened dry cereal.  6 oz (170 g) plain fat-free yogurt or yogurt sweetened with artificial sweeteners.  8 oz (240 mL) milk.  8 oz (170 g) fresh fruit or one small piece of fruit.  24 oz (680 g) popped popcorn.  Example of carbohydrate counting Sample meal  3 oz (85 g)  chicken breast.  6 oz (170 g) brown rice.  4 oz (113 g) corn.  8 oz (240 mL) milk.  8 oz (170 g) strawberries with sugar-free whipped topping. Carbohydrate calculation 1. Identify the foods that contain carbohydrates: ? Rice. ? Corn. ? Milk. ? Strawberries. 2. Calculate how many servings you have of each food: ? 2 servings rice. ? 1 serving corn. ? 1 serving milk. ? 1 serving strawberries. 3. Multiply each number of servings by 15 g: ? 2 servings rice x 15 g = 30 g. ? 1 serving corn x 15 g = 15 g. ? 1 serving milk x 15 g = 15 g. ? 1 serving strawberries x 15 g = 15 g. 4. Add together all of the amounts to find the total grams of carbohydrates eaten: ? 30 g + 15 g + 15 g + 15 g = 75 g of carbohydrates total. This information is not intended to replace advice given to you by your health care provider. Make sure you discuss any questions you have with your health care provider. Document Released: 03/22/2005 Document Revised: 10/10/2015 Document Reviewed: 09/03/2015 Elsevier Interactive Patient Education  [employer].

## 2017-01-07 NOTE — Progress Notes (Signed)
HPI:                                                                Morgan Duran is a 51 y.o. female who presents to [facility] today for diabetes follow-up  DMII: taking Metformin daily. Has been giving herself 36 units of Tou[place] at bedtime. Compliant with medications. Last A1C 10.2 (10/04/2016).  Does not heck blood sugars at home. Denies polyuria, vision change, and paresthesias. Denies hypoglycemic events. Denies ulcers/wounds on feet. Eye exam: due Foot exam: due Diet: Ketodiet, has an appt with dietician Exercise: gym, 3 times per week   Past Medical History:  Diagnosis Date  . Arthritis   . Diabetes mellitus without complication [facility])    Past Surgical History:  Procedure Laterality Date  . KNEE ARTHROSCOPY    . NASAL SEPTUM SURGERY    . SHOULDER ARTHROSCOPY    . TONSILLECTOMY     Social History  Substance Use Topics  . Smoking status: Former Research scientist (life sciences)  . Smokeless tobacco: Never Used  . Alcohol use Yes   family history includes Diabetes in her father; Emphysema in her mother.  ROS: negative except as noted in the HPI  Medications: Current Outpatient Prescriptions  Medication Sig Dispense Refill  . aspirin EC 81 MG tablet Take 1 tablet (81 mg total) by mouth daily. 90 tablet 3  . atorvastatin (LIPITOR) 20 MG tablet Take 1 tablet (20 mg total) by mouth daily. 90 tablet 3  . dapagliflozin propanediol (FARXIGA) 5 MG TABS tablet Take 5 mg by mouth daily. 90 tablet 0  . Insulin Glargine 300 UNIT/ML SOPN Inject 36 Units into the skin at bedtime.    . metFORMIN (GLUCOPHAGE) 1000 MG tablet Take 1 tablet (1,000 mg total) by mouth 2 (two) times daily with a meal. 180 tablet 0  . metoCLOPramide (REGLAN) 10 MG tablet 1 tab PO TID prn Nausea 30 tablet 0  . rizatriptan (MAXALT-MLT) 5 MG disintegrating tablet Take 1 tablet (5 mg total) by mouth as needed for migraine. May repeat in 2 hours if needed 10 tablet 0   No current  facility-administered medications for this visit.    No Known Allergies     Objective:  BP 116/77   Pulse 72   Wt 263 lb (119.3 kg)   LMP 12/31/2016 (Approximate)   BMI 45.14 kg/m  Gen:  alert, not ill-appearing, no distress, appropriate for age, morbidly obese female HEENT: head normocephalic without obvious abnormality, conjunctiva and cornea clear, trachea midline Pulm: Normal work of breathing, normal phonation, clear to auscultation bilaterally, no wheezes, rales or rhonchi CV: Normal rate, regular rhythm, s1 and s2 distinct, grade II/VI systolic murmur, no clicks or rubs Neuro: alert and oriented x 3, no tremor MSK: extremities atraumatic, normal gait and station Skin: intact, no rashes on exposed skin, no jaundice, no cyanosis Psych: well-groomed, cooperative, good eye contact, euthymic mood, affect mood-congruent, speech is articulate, and thought processes clear and goal-directed    No results found for this or any previous visit (from the past 72 hour(s)). No results found.    Assessment and Plan: 51 y.o. female with   1. Type 2 diabetes mellitus without complication, with long-term current use of insulin (HCC) - POCT HgB  A1C 7.0, controlled - POCT UA - Microalbumin negative - patient is losing weight and improving diet. She is interested in getting off insulin. Educated on carb counting. Will trial Farxiga with close follow-up in 3 months - BP at goal without medication - recommend baby asa - declines pneumonia and influenza vaccines - recommend annual diabetic eye exam - dapagliflozin propanediol (FARXIGA) 5 MG TABS tablet; Take 5 mg by mouth daily.  Dispense: 90 tablet; Refill: 0 - metFORMIN (GLUCOPHAGE) 1000 MG tablet; Take 1 tablet (1,000 mg total) by mouth 2 (two) times daily with a meal.  Dispense: 180 tablet; Refill: 0   2. Hyperlipidemia associated with type 2 diabetes mellitus (HCC) - LDL goal <70 - atorvastatin (LIPITOR) 20 MG tablet; Take 1 tablet  (20 mg total) by mouth daily.  Dispense: 90 tablet; Refill: 3  3. Systolic murmur - murmur has never been assessed. Recommend echo - ECHOCARDIOGRAM COMPLETE; Future  Patient education and anticipatory guidance given Patient agrees with treatment plan Follow-up as needed if symptoms worsen or fail to improve  Darlyne Russian PA-C

## 2017-01-13 ENCOUNTER — Telehealth: Payer: Self-pay | Admitting: *Deleted

## 2017-01-13 NOTE — Telephone Encounter (Signed)
Morgan Duran was sent to mail order pharmacy [facility] and they do not process coupon cards, only the standard retail pharmacies. [place] hotline and what they said was they can reimburse patient if she pays out of pocket. They will generate a form and mail it to the patient explaining this and she can get reimbursed

## 2017-01-15 DIAGNOSIS — Z9989 Dependence on other enabling machines and devices: Secondary | ICD-10-CM

## 2017-01-15 DIAGNOSIS — G43909 Migraine, unspecified, not intractable, without status migrainosus: Secondary | ICD-10-CM | POA: Insufficient documentation

## 2017-01-15 DIAGNOSIS — G4733 Obstructive sleep apnea (adult) (pediatric): Secondary | ICD-10-CM | POA: Insufficient documentation

## 2017-01-15 DIAGNOSIS — R011 Cardiac murmur, unspecified: Secondary | ICD-10-CM | POA: Insufficient documentation

## 2017-01-21 ENCOUNTER — Encounter: Payer: BLUE CROSS/BLUE SHIELD | Attending: Physician Assistant | Admitting: Registered"

## 2017-01-21 ENCOUNTER — Encounter: Payer: Self-pay | Admitting: Registered"

## 2017-01-21 DIAGNOSIS — E1169 Type 2 diabetes mellitus with other specified complication: Secondary | ICD-10-CM | POA: Insufficient documentation

## 2017-01-21 DIAGNOSIS — E785 Hyperlipidemia, unspecified: Secondary | ICD-10-CM

## 2017-01-21 DIAGNOSIS — E119 Type 2 diabetes mellitus without complications: Secondary | ICD-10-CM | POA: Insufficient documentation

## 2017-01-21 DIAGNOSIS — Z794 Long term (current) use of insulin: Secondary | ICD-10-CM | POA: Diagnosis not present

## 2017-01-21 DIAGNOSIS — Z713 Dietary counseling and surveillance: Secondary | ICD-10-CM | POA: Insufficient documentation

## 2017-01-21 DIAGNOSIS — E1165 Type 2 diabetes mellitus with hyperglycemia: Secondary | ICD-10-CM

## 2017-01-21 NOTE — Patient Instructions (Signed)
Plan:  Aim for 3-4 Carb Choices per meal (45-60 grams) Aim for 0-1 Carbs per snack if hungry  Include protein with your meals and snacks Consider reading food labels for Total Carbohydrate  Continue increasing your activity level  daily as tolerated Consider checking BG at alternate times per day as directed by MD  Continue taking medication as directed by MD Consider contacting your doctor about concerns about the Lipitor

## 2017-01-21 NOTE — Progress Notes (Signed)
Diabetes Self-Management Education  Visit Type: First/Initial  Appt. Start Time: 0915 Appt. End Time: 7425  01/21/2017  Ms. Morgan Duran, identified by name and date of birth, is a 51 y.o. female with a diagnosis of Diabetes: Type 2.   ASSESSMENT Pt states she has tried to do the keto diet, and cut carbs to as close to zero as possible. Pt states she lives on caffeine and ~3 bottles of 5-hr energy drinks to keep her going.  Pt states her doctor prescribed Morgan Duran because patient didn't like doing insulin injections, but patient decided she would rather do the injections instead of risking potential side effects of Farxiga.  Pt states she has only taken the Lipitor once and it made her urine smell fishy. 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  Pt states recently her work load has increased and she has put exercising on the back burner but is working to make it a priority again. Pt states she was working out with weights and using an Market researcher.  Pt states she experiences neuropathy and osteoarthritis. Pt reports her doctor recommended tumeric for inflammation. Pt states her migraines have resolved and believes it is due to being more involved in church where she receives spiritual and social support.      Diabetes Self-Management Education - 01/21/17 0926      Visit Information   Visit Type First/Initial     Initial Visit   Diabetes Type Type 2   Are you currently following a meal plan? Yes   What type of meal plan do you follow? keto   Are you taking your medications as prescribed? Yes  insulin and metformin, not taking [place]   Date Diagnosed 4 years ago     Health Coping   How would you rate your overall health? Fair     Psychosocial Assessment   Patient Belief/Attitude about Diabetes Other (comment)  fighting it   How often do you need to have someone help you when you read instructions, pamphlets, or  other written materials from your doctor or pharmacy? 1 - Never   What is the last grade level you completed in school? associates     Complications   Last HgB A1C per patient/outside source 7 %   How often do you check your blood sugar? 1-2 times/day   Fasting Blood glucose range (mg/dL) --  98-150 (115-129 is normal)   Number of hypoglycemic episodes per month 0   Number of hyperglycemic episodes per week 0   Have you had a dilated eye exam in the past 12 months? No   Have you had a dental exam in the past 12 months? No   Are you checking your feet? Yes   How many days per week are you checking your feet? 7     Dietary Intake   Breakfast eggs, sausage, spinach, cheese, coffee stevia or cream, almond milk   Snack (morning) none   Lunch lean meat, vegetable, dairy  Snack (afternoon) none   Dinner same as lunch   Snack (evening) none   Beverage(s) coffee, almond milk, water     Exercise   Exercise Type Light (walking / raking leaves)   How many days per week to you exercise? 3   How many minutes per day do you exercise? 20   Total minutes per week of exercise 60     Patient Education   Previous Diabetes Education No   Disease state  Definition of diabetes, type 1 and 2, and the diagnosis of diabetes;Factors that contribute to the development of diabetes   Nutrition management  Role of diet in the treatment of diabetes and the relationship between the three main macronutrients and blood glucose level;Food label reading, portion sizes and measuring food.;Carbohydrate counting   Physical activity and exercise  Role of exercise on diabetes management, blood pressure control and cardiac health.   Medications Reviewed patients medication for diabetes, action, purpose, timing of dose and side effects.   Monitoring Identified appropriate SMBG and/or A1C goals.    Acute complications Taught treatment of hypoglycemia - the 15 rule.   Chronic complications Relationship between chronic complications and blood glucose control     Individualized Goals (developed by patient)   Nutrition Follow meal plan discussed   Physical Activity Exercise 3-5 times per week   Health Coping Other (comment)  RD provided list of counselors     Outcomes   Expected Outcomes Demonstrated interest in learning. Expect positive outcomes   Future DMSE PRN   Program Status Completed    Individualized Plan for Diabetes Self-Management Training:   Learning Objective:  Patient will have a greater understanding of diabetes self-management. Patient education plan is to attend individual and/or group sessions per assessed needs and concerns.   Patient Instructions  Plan:  Aim for 3-4 Carb Choices per meal (45-60 grams) Aim for 0-1 Carbs per snack if hungry  Include protein with your meals and snacks Consider reading food labels for Total Carbohydrate  Continue increasing your activity level  daily as tolerated Consider checking BG at alternate times per day as directed by MD  Continue taking medication as directed by MD Consider contacting your doctor about concerns about the Lipitor  Expected Outcomes:  Demonstrated interest in learning. Expect positive outcomes  Education material provided: Living Well with Diabetes, A1C conversion sheet, My Plate, Snack sheet and Carbohydrate counting sheet  If problems or questions, patient to contact team via:  Phone  Future DSME appointment: PRN

## 2017-02-03 ENCOUNTER — Ambulatory Visit (HOSPITAL_BASED_OUTPATIENT_CLINIC_OR_DEPARTMENT_OTHER): Payer: BLUE CROSS/BLUE SHIELD

## 2017-02-08 ENCOUNTER — Other Ambulatory Visit: Payer: Self-pay

## 2017-02-08 MED ORDER — AMBULATORY NON FORMULARY MEDICATION: 1 refills | Status: DC

## 2017-02-08 NOTE — Telephone Encounter (Signed)
Test strips ordered -EH/RMA

## 2017-03-01 ENCOUNTER — Ambulatory Visit (HOSPITAL_BASED_OUTPATIENT_CLINIC_OR_DEPARTMENT_OTHER): Payer: BLUE CROSS/BLUE SHIELD

## 2017-03-22 ENCOUNTER — Ambulatory Visit (HOSPITAL_BASED_OUTPATIENT_CLINIC_OR_DEPARTMENT_OTHER)
Admission: RE | Admit: 2017-03-22 | Discharge: 2017-03-22 | Disposition: A | Discharge status: Home / Self Care | Payer: BLUE CROSS/BLUE SHIELD | Source: Ambulatory Visit | Attending: Physician Assistant | Admitting: Physician Assistant

## 2017-03-22 DIAGNOSIS — R011 Cardiac murmur, unspecified: Secondary | ICD-10-CM | POA: Diagnosis not present

## 2017-03-22 DIAGNOSIS — M1711 Unilateral primary osteoarthritis, right knee: Secondary | ICD-10-CM | POA: Insufficient documentation

## 2017-03-22 DIAGNOSIS — M25561 Pain in right knee: Secondary | ICD-10-CM | POA: Diagnosis not present

## 2017-03-22 NOTE — Progress Notes (Signed)
Echocardiogram 2D Echocardiogram has been performed.  Joelene Millin 03/22/2017, 9:25 AM

## 2017-03-23 ENCOUNTER — Ambulatory Visit (INDEPENDENT_AMBULATORY_CARE_PROVIDER_SITE_OTHER): Payer: BLUE CROSS/BLUE SHIELD | Admitting: Physician Assistant

## 2017-03-23 ENCOUNTER — Encounter: Payer: Self-pay | Admitting: Physician Assistant

## 2017-03-23 VITALS — BP 122/78 | HR 76 | Wt 270.0 lb

## 2017-03-23 DIAGNOSIS — E1169 Type 2 diabetes mellitus with other specified complication: Secondary | ICD-10-CM

## 2017-03-23 DIAGNOSIS — E119 Type 2 diabetes mellitus without complications: Secondary | ICD-10-CM

## 2017-03-23 DIAGNOSIS — I350 Nonrheumatic aortic (valve) stenosis: Secondary | ICD-10-CM | POA: Diagnosis not present

## 2017-03-23 DIAGNOSIS — E785 Hyperlipidemia, unspecified: Secondary | ICD-10-CM | POA: Diagnosis not present

## 2017-03-23 DIAGNOSIS — Z6841 Body Mass Index (BMI) 40.0 and over, adult: Secondary | ICD-10-CM

## 2017-03-23 DIAGNOSIS — Z9114 Patient's other noncompliance with medication regimen: Secondary | ICD-10-CM | POA: Diagnosis not present

## 2017-03-23 DIAGNOSIS — Z794 Long term (current) use of insulin: Secondary | ICD-10-CM

## 2017-03-23 DIAGNOSIS — Z2821 Immunization not carried out because of patient refusal: Secondary | ICD-10-CM

## 2017-03-23 DIAGNOSIS — I10 Essential (primary) hypertension: Secondary | ICD-10-CM | POA: Diagnosis not present

## 2017-03-23 DIAGNOSIS — M1990 Unspecified osteoarthritis, unspecified site: Secondary | ICD-10-CM | POA: Insufficient documentation

## 2017-03-23 MED ORDER — INSULIN GLARGINE 300 UNIT/ML ~~LOC~~ SOPN: 38.0000 [IU] | PEN_INJECTOR | Freq: Every day | SUBCUTANEOUS | 1 refills | Status: DC

## 2017-03-23 MED ORDER — LISINOPRIL 10 MG PO TABS: 10.0000 mg | ORAL_TABLET | Freq: Every day | ORAL | 0 refills | Status: DC

## 2017-03-23 MED ORDER — ATORVASTATIN CALCIUM 20 MG PO TABS: 20.0000 mg | ORAL_TABLET | Freq: Every day | ORAL | 3 refills | Status: DC

## 2017-03-23 NOTE — Progress Notes (Signed)
HPI:                                                                Morgan Duran is a 51 y.o. female who presents to [facility] today for diabetes follow-up  DMII: taking Metformin daily. She is also doing [place] 36 units at bedtime. She never started [place] because she was concerned about possible adverse effects, specifically mycotic infections. Compliant with medications. Last A1C 10.2, 6 months ago. Denies polyuria, vision change, and paresthesias. Denies hypoglycemic events. Denies ulcers/wounds on feet. Eye exam: overdue Foot exam: UTD, no neuropathy Diet: struggles with maintaining a healthy diet due to stress/work schedule Exercise: not currently  HTN: taking Lisinopril 10mg  daily. Compliant with medications. Does not check BP's at home. Denies vision change, headache, chest pain with exertion, orthopnea, lightheadedness, syncope and edema. Risk factors include: obesity, DM 2  She recently underwent an echocardiogram for a murmur, which showed mild aortic stenosis with normal EF. She denies worsening fatigue, dyspnea, PND, peripheral edema.  Past Medical History:  Diagnosis Date  . Anxiety   . Arthritis   . Diabetes mellitus without complication ([place])   . Heart murmur   . Hyperlipidemia   . Hypertension   . Migraine   . Morbid obesity ([place])   . OSA on CPAP    Past Surgical History:  Procedure Laterality Date  . KNEE ARTHROSCOPY    . NASAL SEPTUM SURGERY    . SHOULDER ARTHROSCOPY    . TONSILLECTOMY     Social History   Tobacco Use  . Smoking status: Former Research scientist (life sciences)  . Smokeless tobacco: Never Used  Substance Use Topics  . Alcohol use: Yes   family history includes Diabetes in her father; Emphysema in her mother.  ROS: negative except as noted in the HPI  Medications: Current Outpatient Medications  Medication Sig Dispense Refill  . AMBULATORY NON FORMULARY MEDICATION One Touch Ultra Strip Blue 50's Test daily  100 strip 1  . aspirin EC 81 MG tablet Take 1 tablet (81 mg total) by mouth daily. 90 tablet 3  . Insulin Glargine 300 UNIT/ML SOPN Inject 38 Units into the skin at bedtime. 3 mL 1  . metFORMIN (GLUCOPHAGE) 1000 MG tablet Take 1 tablet (1,000 mg total) by mouth 2 (two) times daily with a meal. 180 tablet 0  . Multiple Vitamins-Minerals (MULTIVITAMIN WOMEN PO) Take by mouth.    . Naproxen Sodium (ALEVE PO) Take by mouth.    Marland Kitchen atorvastatin (LIPITOR) 20 MG tablet Take 1 tablet (20 mg total) by mouth at bedtime. 90 tablet 3  . lisinopril (PRINIVIL,ZESTRIL) 10 MG tablet Take 1 tablet (10 mg total) by mouth daily. 30 tablet 0  . metoCLOPramide (REGLAN) 10 MG tablet 1 tab PO TID prn Nausea (Patient not taking: Reported on 01/21/2017) 30 tablet 0  . rizatriptan (MAXALT-MLT) 5 MG disintegrating tablet Take 1 tablet (5 mg total) by mouth as needed for migraine. May repeat in 2 hours if needed (Patient not taking: Reported on 03/23/2017) 10 tablet 0   No current facility-administered medications for this visit.    No Known Allergies     Objective:  BP 122/78   Pulse 76   Wt 270 lb (122.5 kg)  BMI 46.35 kg/m  Gen:  alert, not ill-appearing, no distress, appropriate for age, morbidly obese female HEENT: head normocephalic without obvious abnormality, conjunctiva and cornea clear, trachea midline Pulm: Normal work of breathing, normal phonation, clear to auscultation bilaterally, no wheezes, rales or rhonchi CV: Normal rate, regular rhythm, s1 and s2 distinct, grade II/VI systolic murmur, no clicks or rubs  Neuro: alert and oriented x 3, no tremor MSK: extremities atraumatic, normal gait and station Skin: intact, no rashes on exposed skin, no jaundice, no cyanosis Psych: well-groomed, cooperative, good eye contact, euthymic mood, affect mood-congruent, speech is articulate, and thought processes clear and goal-directed  Depression screen [facility] 2/9 01/21/2017 01/07/2017  Decreased Interest 0 0   Down, Depressed, Hopeless 0 0  PHQ - 2 Score 0 0     No results found for this or any previous visit (from the past 72 hour(s)). No results found.    Assessment and Plan: 51 y.o. female with   1. Controlled type 2 diabetes mellitus without complication, with long-term current use of insulin ([place]) Lab Results  Component Value Date   HGBA1C 7.0 01/07/2017  - I am very proud of Lonna for getting her diabetes under control. She is motivated to make changes. If we can get A1c to 6.5 that would be ideal - instructed on self-titrating Tresiba to goal of morning FBG 80-130 - continue Metformin, okay to avoid Faxiga and SGLT2 class. Will consider DPP4 if she needs additional medication - declines influenza and pneumococcal vaccines - BP at goal <130/80 - on ACE - not currently taking statin, but she will start - Insulin Glargine 300 UNIT/ML SOPN; Inject 38 Units into the skin at bedtime.  Dispense: 3 mL; Refill: 1  2. Hyperlipidemia associated with type 2 diabetes mellitus (HCC) - LDL goal <70 - explained morbidity and mortality benefits of statin therapy, especially in diabetes - recheck fasting lipids in 3 months - atorvastatin (LIPITOR) 20 MG tablet; Take 1 tablet (20 mg total) by mouth at bedtime.  Dispense: 90 tablet; Refill: 3  3. Aortic stenosis, mild - repeat echo in 1 year. If stable, recheck Q3years  4. Noncompliance with medications - mostly due to concerns about adverse effect or confusion about which medication is for what indication. Patient education provided  Hypertension goal BP <130/80 BP Readings from Last 3 Encounters:  03/23/17 122/78  01/07/17 116/77  10/04/16 136/87  - continue ACE and baby asa - follow-up Q36months  Patient education and anticipatory guidance given Patient agrees with treatment plan Follow-up in 3 months or sooner as needed if symptoms worsen or fail to improve  Darlyne Russian PA-C

## 2017-03-23 NOTE — Patient Instructions (Addendum)
Make sure you get your Atorvastatin (cholesterol medicine)  For Diabetes Continue Metformin as you are taking it Measure fasting blood sugar every day: goal for now is to get this to 80-130 Increase daily Insulin dose by 2 units at a time every 3 days until fasting morning sugars are consistently 80-130, then continue at that dose Plan to recheck A1C in 3 months Plan to follow-up in the office sooner if you experience low sugars  Plan to follow-up in the office sooner if your fasting sugars are consistently high Bring all sugar readings with you to your office visits Limit carbohydrates to 30g per meal and 15g per snack

## 2017-03-25 ENCOUNTER — Telehealth: Payer: Self-pay

## 2017-03-25 NOTE — Telephone Encounter (Signed)
Spoke with [facility]. They are sending medication to patient and will have it deliver to patient

## 2017-03-25 NOTE — Telephone Encounter (Signed)
Patient called to let us know Morgan Duran [facility] about Toujeo. Pharmacy is needing to ask questions about the medication. Will need to f/u on this situation to see if medication may need a PA.

## 2017-04-03 ENCOUNTER — Encounter: Payer: Self-pay | Admitting: Physician Assistant

## 2017-04-03 DIAGNOSIS — Z6841 Body Mass Index (BMI) 40.0 and over, adult: Secondary | ICD-10-CM | POA: Insufficient documentation

## 2017-04-03 DIAGNOSIS — I10 Essential (primary) hypertension: Secondary | ICD-10-CM

## 2017-04-03 DIAGNOSIS — I152 Hypertension secondary to endocrine disorders: Secondary | ICD-10-CM | POA: Insufficient documentation

## 2017-04-03 DIAGNOSIS — Z2821 Immunization not carried out because of patient refusal: Secondary | ICD-10-CM | POA: Insufficient documentation

## 2017-04-03 DIAGNOSIS — E1159 Type 2 diabetes mellitus with other circulatory complications: Secondary | ICD-10-CM | POA: Insufficient documentation

## 2017-05-13 ENCOUNTER — Ambulatory Visit (INDEPENDENT_AMBULATORY_CARE_PROVIDER_SITE_OTHER): Payer: BLUE CROSS/BLUE SHIELD | Admitting: Physician Assistant

## 2017-05-13 ENCOUNTER — Encounter: Payer: Self-pay | Admitting: Physician Assistant

## 2017-05-13 VITALS — BP 135/91 | HR 81 | Temp 98.0°F | Wt 270.0 lb

## 2017-05-13 DIAGNOSIS — M1731 Unilateral post-traumatic osteoarthritis, right knee: Secondary | ICD-10-CM | POA: Diagnosis not present

## 2017-05-13 DIAGNOSIS — J00 Acute nasopharyngitis [common cold]: Secondary | ICD-10-CM | POA: Diagnosis not present

## 2017-05-13 LAB — POCT INFLUENZA A/B
INFLUENZA A, POC: NEGATIVE
INFLUENZA B, POC: NEGATIVE

## 2017-05-13 NOTE — Progress Notes (Signed)
HPI:                                                                Morgan Duran is a 52 y.o. female who presents to [place] today for URI symptoms  URI   This is a new problem. The current episode started in the past 7 days (x 3 days). The problem has been gradually worsening. Maximum temperature: tactile. Associated symptoms include congestion, sinus pain and a sore throat. Pertinent negatives include no coughing or wheezing. Associated symptoms comments: + fatigue + malaise. She has tried antihistamine (Flonase, OTC cold/flu) for the symptoms.    Depression screen [facility] 2/9 01/21/2017 01/07/2017  Decreased Interest 0 0  Down, Depressed, Hopeless 0 0  PHQ - 2 Score 0 0    No flowsheet data found.    Past Medical History:  Diagnosis Date  . Anxiety   . Arthritis   . Diabetes mellitus without complication ([place])   . Heart murmur   . Hyperlipidemia   . Hypertension   . Migraine   . Morbid obesity ([place])   . OSA on CPAP    Past Surgical History:  Procedure Laterality Date  . KNEE ARTHROSCOPY    . NASAL SEPTUM SURGERY    . SHOULDER ARTHROSCOPY    . TONSILLECTOMY     Social History   Tobacco Use  . Smoking status: Former Research scientist (life sciences)  . Smokeless tobacco: Never Used  Substance Use Topics  . Alcohol use: Yes   family history includes Diabetes in her father; Emphysema in her mother.    ROS: negative except as noted in the HPI  Medications: Current Outpatient Medications  Medication Sig Dispense Refill  . AMBULATORY NON FORMULARY MEDICATION One Touch Ultra Strip Blue 50's Test daily 100 strip 1  . aspirin EC 81 MG tablet Take 1 tablet (81 mg total) by mouth daily. 90 tablet 3  . atorvastatin (LIPITOR) 20 MG tablet Take 1 tablet (20 mg total) by mouth at bedtime. 90 tablet 3  . Insulin Glargine 300 UNIT/ML SOPN Inject 38 Units into the skin at bedtime. 3 mL 1  . lisinopril (PRINIVIL,ZESTRIL) 10 MG tablet Take 1 tablet  (10 mg total) by mouth daily. 30 tablet 0  . metFORMIN (GLUCOPHAGE) 1000 MG tablet Take 1 tablet (1,000 mg total) by mouth 2 (two) times daily with a meal. 180 tablet 0  . Multiple Vitamins-Minerals (MULTIVITAMIN WOMEN PO) Take by mouth.    . Naproxen Sodium (ALEVE PO) Take by mouth.     No current facility-administered medications for this visit.    No Known Allergies     Objective:  BP (!) 135/91   Pulse 81   Temp 98 F (36.7 C) (Oral)   Wt 270 lb (122.5 kg)   SpO2 99%   BMI 46.35 kg/m  Gen:  alert, not ill-appearing, no distress, appropriate for age, obese female HEENT: head normocephalic without obvious abnormality, conjunctiva and cornea clear, TM's clear bilaterally, oropharynx with erythema, moist mucous membranes, neck supple, no adenopathy, trachea midline Pulm: Normal work of breathing, normal phonation, clear to auscultation bilaterally, no wheezes, rales or rhonchi CV: Normal rate, regular rhythm, s1 and s2 distinct, no murmurs, clicks or rubs  Neuro: alert and  oriented x 3, no tremor MSK: extremities atraumatic, normal gait and station Skin: intact, no rashes on exposed skin, no jaundice, no cyanosis   No results found for this or any previous visit (from the past 72 hour(s)). No results found.    Assessment and Plan: 52 y.o. female with   1. Acute nasopharyngitis - vital signs reviewed and stable. BP a little out of range. - POCT influenza A/B negative - symptomatic care  2. Post-traumatic osteoarthritis of right knee - she is followed by an orthopedic surgeon who is managing her OA with corticosteroid injections - patient requested a supportive brace because she is unable to find any OTC that fit her leg shape - provided with reaction knee brace   Patient education and anticipatory guidance given Patient agrees with treatment plan Follow-up as needed if symptoms worsen or fail to improve  Darlyne Russian PA-C

## 2017-05-13 NOTE — Patient Instructions (Addendum)
For sore throat: - alternate Tylenol 1000 mg every 8 hours with Ibuprofen 600 mg every 6 hours as needed for pain - warm, salt water gargles - cepacol throat lozenges - drink at least 8 glasses of water per day. Avoid soda/caffeine   Upper Respiratory Infection, Adult Most upper respiratory infections (URIs) are caused by a virus. A URI affects the nose, throat, and upper air passages. The most common type of URI is often called "the common cold." Follow these instructions at home:  Take medicines only as told by your doctor.  Gargle warm saltwater or take cough drops to comfort your throat as told by your doctor.  Use a warm mist humidifier or inhale steam from a shower to increase air moisture. This may make it easier to breathe.  Drink enough fluid to keep your pee (urine) clear or pale yellow.  Eat soups and other clear broths.  Have a healthy diet.  Rest as needed.  Go back to work when your fever is gone or your doctor says it is okay. ? You may need to stay home longer to avoid giving your URI to others. ? You can also wear a face mask and wash your hands often to prevent spread of the virus.  Use your inhaler more if you have asthma.  Do not use any tobacco products, including cigarettes, chewing tobacco, or electronic cigarettes. If you need help quitting, ask your doctor. Contact a doctor if:  You are getting worse, not better.  Your symptoms are not helped by medicine.  You have chills.  You are getting more short of breath.  You have brown or red mucus.  You have yellow or brown discharge from your nose.  You have pain in your face, especially when you bend forward.  You have a fever.  You have puffy (swollen) neck glands.  You have pain while swallowing.  You have white areas in the back of your throat. Get help right away if:  You have very bad or constant: ? Headache. ? Ear pain. ? Pain in your forehead, behind your eyes, and over your  cheekbones (sinus pain). ? Chest pain.  You have long-lasting (chronic) lung disease and any of the following: ? Wheezing. ? Long-lasting cough. ? Coughing up blood. ? A change in your usual mucus.  You have a stiff neck.  You have changes in your: ? Vision. ? Hearing. ? Thinking. ? Mood. This information is not intended to replace advice given to you by your health care provider. Make sure you discuss any questions you have with your health care provider. Document Released: 09/08/2007 Document Revised: 11/23/2015 Document Reviewed: 06/27/2013 Elsevier Interactive Patient Education  2018 [employer].

## 2017-05-16 ENCOUNTER — Telehealth: Payer: Self-pay | Admitting: Physician Assistant

## 2017-05-16 ENCOUNTER — Telehealth: Payer: Self-pay

## 2017-05-16 ENCOUNTER — Encounter: Payer: Self-pay | Admitting: Physician Assistant

## 2017-05-16 NOTE — Telephone Encounter (Signed)
Schedule her for a nurse visit for vital signs

## 2017-05-16 NOTE — Telephone Encounter (Signed)
Pt notified and is scheduled with [place]

## 2017-05-16 NOTE — Telephone Encounter (Signed)
Got patient scheduled for a nurse visit tomorrow so we can get her vitals. thanks

## 2017-05-16 NOTE — Telephone Encounter (Signed)
Pt reports having a new Sx added with her current ones.  She stated she is wheezing.  She is asking for a albutrol inhaler and antibiotics. Please advise. -EH/RMA

## 2017-05-16 NOTE — Telephone Encounter (Signed)
Pt called and cancelled appoint that was suppose to be tomorrow to check for pneumonia. She said she cant afford anoth appointment that close together and didn't know if she can get some type on inhaler to help her breath. Thanks

## 2017-05-16 NOTE — Telephone Encounter (Signed)
She was not having lower respiratory symptoms when I saw her. Because she has diabetes, I really need to see her in the office to rule out pneumonia.

## 2017-05-17 ENCOUNTER — Ambulatory Visit: Payer: BLUE CROSS/BLUE SHIELD | Admitting: Physician Assistant

## 2017-05-17 ENCOUNTER — Ambulatory Visit: Payer: BLUE CROSS/BLUE SHIELD

## 2017-06-10 ENCOUNTER — Telehealth: Payer: Self-pay | Admitting: Physician Assistant

## 2017-06-10 NOTE — Telephone Encounter (Signed)
FOYDXA:[id number];HMCNOB:[id number];Review Type:Prior Auth;Coverage Start Date:05/04/2017;Coverage End Date:06/03/2018;

## 2017-06-27 ENCOUNTER — Telehealth: Payer: Self-pay

## 2017-06-27 ENCOUNTER — Ambulatory Visit: Payer: BLUE CROSS/BLUE SHIELD

## 2017-06-27 NOTE — Telephone Encounter (Signed)
Recommendations left on vm -EH/RMA  

## 2017-06-27 NOTE — Telephone Encounter (Signed)
Okay to fill 30-day supply Do not charge for same-day cancellation

## 2017-06-27 NOTE — Telephone Encounter (Signed)
Pt had 9:30 appointment scheduled for this morning but has canceled due to death in her family.  She is requesting a refill of her diabetic med to be sent to her mail order pharmacy. Please advise. -EH/RMA

## 2017-06-28 ENCOUNTER — Other Ambulatory Visit: Payer: Self-pay

## 2017-06-28 DIAGNOSIS — Z794 Long term (current) use of insulin: Principal | ICD-10-CM

## 2017-06-28 DIAGNOSIS — E119 Type 2 diabetes mellitus without complications: Secondary | ICD-10-CM

## 2017-06-28 MED ORDER — INSULIN GLARGINE 300 UNIT/ML ~~LOC~~ SOPN: 38.0000 [IU] | PEN_INJECTOR | Freq: Every day | SUBCUTANEOUS | 1 refills | Status: DC

## 2017-09-10 ENCOUNTER — Other Ambulatory Visit: Payer: Self-pay | Admitting: Physician Assistant

## 2017-09-10 DIAGNOSIS — G43009 Migraine without aura, not intractable, without status migrainosus: Secondary | ICD-10-CM

## 2017-09-19 DIAGNOSIS — M1711 Unilateral primary osteoarthritis, right knee: Secondary | ICD-10-CM | POA: Diagnosis not present

## 2017-09-30 ENCOUNTER — Encounter: Payer: Self-pay | Admitting: Physician Assistant

## 2017-09-30 ENCOUNTER — Ambulatory Visit (INDEPENDENT_AMBULATORY_CARE_PROVIDER_SITE_OTHER): Payer: BLUE CROSS/BLUE SHIELD | Admitting: Physician Assistant

## 2017-09-30 VITALS — BP 126/84 | HR 69 | Resp 14 | Wt 279.0 lb

## 2017-09-30 DIAGNOSIS — Z9111 Patient's noncompliance with dietary regimen: Secondary | ICD-10-CM | POA: Diagnosis not present

## 2017-09-30 DIAGNOSIS — G43009 Migraine without aura, not intractable, without status migrainosus: Secondary | ICD-10-CM | POA: Diagnosis not present

## 2017-09-30 DIAGNOSIS — E1169 Type 2 diabetes mellitus with other specified complication: Secondary | ICD-10-CM

## 2017-09-30 DIAGNOSIS — Z91199 Patient's noncompliance with other medical treatment and regimen due to unspecified reason: Secondary | ICD-10-CM

## 2017-09-30 DIAGNOSIS — N924 Excessive bleeding in the premenopausal period: Secondary | ICD-10-CM

## 2017-09-30 DIAGNOSIS — E1165 Type 2 diabetes mellitus with hyperglycemia: Secondary | ICD-10-CM

## 2017-09-30 DIAGNOSIS — G43709 Chronic migraine without aura, not intractable, without status migrainosus: Secondary | ICD-10-CM | POA: Insufficient documentation

## 2017-09-30 DIAGNOSIS — E785 Hyperlipidemia, unspecified: Secondary | ICD-10-CM

## 2017-09-30 LAB — POCT GLYCOSYLATED HEMOGLOBIN (HGB A1C): HbA1c, POC (controlled diabetic range): 9.3 % — AB (ref 0.0–7.0)

## 2017-09-30 MED ORDER — METFORMIN HCL 1000 MG PO TABS: 1000.0000 mg | ORAL_TABLET | Freq: Two times a day (BID) | ORAL | 1 refills | Status: DC

## 2017-09-30 MED ORDER — ATORVASTATIN CALCIUM 20 MG PO TABS: 20.0000 mg | ORAL_TABLET | Freq: Every day | ORAL | 1 refills | Status: DC

## 2017-09-30 MED ORDER — DULAGLUTIDE 0.75 MG/0.5ML ~~LOC~~ SOAJ: 0.5000 mL | SUBCUTANEOUS | 0 refills | Status: DC

## 2017-09-30 MED ORDER — METFORMIN HCL 1000 MG PO TABS: 1000.0000 mg | ORAL_TABLET | Freq: Two times a day (BID) | ORAL | 0 refills | Status: DC

## 2017-09-30 MED ORDER — EMGALITY 120 MG/ML ~~LOC~~ SOAJ: 240.0000 mg | Freq: Once | SUBCUTANEOUS | 0 refills | Status: AC

## 2017-09-30 MED ORDER — DULAGLUTIDE 0.75 MG/0.5ML ~~LOC~~ SOAJ
SUBCUTANEOUS | 2 refills | Status: DC
Start: 2017-09-30 — End: 2017-09-30

## 2017-09-30 MED ORDER — INSULIN GLARGINE 300 UNIT/ML ~~LOC~~ SOPN: 38.0000 [IU] | PEN_INJECTOR | Freq: Every day | SUBCUTANEOUS | 3 refills | Status: DC

## 2017-09-30 MED ORDER — RIZATRIPTAN BENZOATE 10 MG PO TABS: 10.0000 mg | ORAL_TABLET | ORAL | 0 refills | Status: DC | PRN

## 2017-09-30 MED ORDER — GLUCOSE BLOOD VI STRP: ORAL_STRIP | 0 refills | Status: DC

## 2017-09-30 MED ORDER — RIZATRIPTAN BENZOATE 10 MG PO TABS: 10.0000 mg | ORAL_TABLET | ORAL | 2 refills | Status: DC | PRN

## 2017-09-30 MED ORDER — INSULIN GLARGINE 300 UNIT/ML ~~LOC~~ SOPN: 38.0000 [IU] | PEN_INJECTOR | Freq: Every day | SUBCUTANEOUS | 0 refills | Status: DC

## 2017-09-30 MED ORDER — LISINOPRIL 10 MG PO TABS: 10.0000 mg | ORAL_TABLET | Freq: Every day | ORAL | 1 refills | Status: DC

## 2017-09-30 MED ORDER — GLUCOSE BLOOD VI STRP
ORAL_STRIP | 2 refills | Status: DC
Start: 2017-09-30 — End: 2018-05-26

## 2017-09-30 MED ORDER — METOCLOPRAMIDE HCL 10 MG PO TABS: 10.0000 mg | ORAL_TABLET | Freq: Four times a day (QID) | ORAL | 2 refills | Status: DC | PRN

## 2017-09-30 MED ORDER — METOCLOPRAMIDE HCL 10 MG PO TABS: 10.0000 mg | ORAL_TABLET | Freq: Four times a day (QID) | ORAL | 0 refills | Status: DC | PRN

## 2017-09-30 NOTE — Patient Instructions (Addendum)
For Diabetes: Continue Metformin as you are taking it Start Trulicity injection once a week. Follow the instructions on the label to gradually increase your dose. Measure fasting blood sugar every day: goal for now is to get this to 80-130 Increase daily Insulin dose by 2 units at a time every 3 days until fasting sugars are consistently 80-130, then continue at that dose Follow an ADA diet. Limit carbohydrates to 40g per meal and 20g per snack Plan to recheck A1C in 3 months Plan to follow-up in the office sooner if you experience low sugars  Plan to follow-up in the office sooner if your fasting sugars are consistently high Bring all sugar readings with you to your office visits  Diabetes Preventive Care: - annual foot exam  - annual dilated eye exam with an eye doctor - self foot exams at least weekly - pneumonia vaccine once (booster in 5 years and at age 30) - annual influenza vaccine - twice yearly dental cleanings and yearly exam - goal blood pressure <140/90, ideally <130/80 (take Lisinopril every morning) - LDL cholesterol <70 (take Atorvastatin daily at bedtime) - A1C <7.0 - body mass index (BMI) <25.0 - follow-up every 3 months if your A1C is not at goal - follow-up every 6 months if diabetes is well controlled   For your migraines: - headache diary (paper or app like MigraineBuddy, iHeadache) - start Emgality injection for prophylaxis (first dose is 2 pens injected once. Followed by 1 pen injected monthly) - take Rizatriptan at the first sign of migraine for abortive therapy - may repeat once after 2 hours if headache persists - do not use more than twice in 24 hours. And try not to use abortive medication more than 6 times per month - work on sleep hygiene - drink 11 cups of water per day - reduce caffeine intake - avoid alcohol - don't skip meals  NOTE: Trulicity and Emgality are branded medications that require a Prior Authorization with your insurance. Please  allow 2 weeks for PA to be approved.

## 2017-09-30 NOTE — Progress Notes (Signed)
HPI:                                                                Morgan Duran is a 52 y.o. female who presents to [place]: [place] today for medication management  Additional concerns: migraines and menses  DM II: Currently using Toujeo 38 Units nightly in addition to Metformin. Reports she has missed multiple doses because of her busy work schedule.  Denies polydipsia, polyuria, polyphagia. Endorses blurred vision. Denies extremity pain, altered sensation and paresthesias.  Denies ulcers/wounds on feet. Hx of DKA/HHS: none Diabetes associated symptoms: Glucometer: OneTouch Blood glucose readings: 180-300 Hypoglycemia frequency: none Severe hypoglycemia (requiring 3rd party assistance): none  Migraines: feels like she has having increased frequency of her migraines Reports she has migraines at least 2 days per week. This is interfering with her work. Headaches are behind the eyes and occipital area. Associated with nausea, dizziness, photophobia, phonophobia, and fatigue. Menses are a trigger for her Reports she "gets a funny feeling" before the headache comes on, but no visual aura She has tried Topamax and Pamelor  Menses Periods are fluctuating. Reports she went through 2 boxes of super plus tampons and super maxi pads on her last cycle.  Most recent cycle lasted almost 2 weeks, with spotting Prior to this cycle, her typical menses last 1 week Cycles are regular and heavy  Depression screen [facility] 2/9 01/21/2017 01/07/2017  Decreased Interest 0 0  Down, Depressed, Hopeless 0 0  PHQ - 2 Score 0 0    No flowsheet data found.    Past Medical History:  Diagnosis Date  . Anxiety   . Arthritis   . Diabetes mellitus without complication ([place])   . Heart murmur   . Hyperlipidemia   . Hypertension   . Migraine   . Morbid obesity ([place])   . OSA on CPAP    Past Surgical History:  Procedure Laterality Date  . KNEE ARTHROSCOPY     . NASAL SEPTUM SURGERY    . SHOULDER ARTHROSCOPY    . TONSILLECTOMY     Social History   Tobacco Use  . Smoking status: Former Research scientist (life sciences)  . Smokeless tobacco: Never Used  Substance Use Topics  . Alcohol use: Yes   family history includes Diabetes in her father; Emphysema in her mother.    ROS: negative except as noted in the HPI  Medications: Current Outpatient Medications  Medication Sig Dispense Refill  . AMBULATORY NON FORMULARY MEDICATION One Touch Ultra Strip Blue 50's Test daily 100 strip 1  . aspirin EC 81 MG tablet Take 1 tablet (81 mg total) by mouth daily. 90 tablet 3  . Insulin Glargine 300 UNIT/ML SOPN Inject 38 Units into the skin at bedtime. 3 mL 1  . Multiple Vitamins-Minerals (MULTIVITAMIN WOMEN PO) Take by mouth.    . Naproxen Sodium (ALEVE PO) Take by mouth.    Marland Kitchen atorvastatin (LIPITOR) 20 MG tablet Take 1 tablet (20 mg total) by mouth at bedtime. (Patient not taking: Reported on 09/30/2017) 90 tablet 3  . lisinopril (PRINIVIL,ZESTRIL) 10 MG tablet Take 1 tablet (10 mg total) by mouth daily. (Patient not taking: Reported on 09/30/2017) 30 tablet 0  . metFORMIN (GLUCOPHAGE) 1000 MG tablet Take 1 tablet (  1,000 mg total) by mouth 2 (two) times daily with a meal. 180 tablet 0   No current facility-administered medications for this visit.    No Known Allergies     Objective:  BP 126/84   Pulse 69   Wt 279 lb (126.6 kg)   BMI 47.89 kg/m  Gen:  alert, not ill-appearing, no distress, appropriate for age, obese female HEENT: head normocephalic without obvious abnormality, conjunctiva and cornea clear, trachea midline Pulm: Normal work of breathing, normal phonation, clear to auscultation bilaterally, no wheezes, rales or rhonchi CV: Normal rate, regular rhythm, s1 and s2 distinct, no murmurs, clicks or rubs  Neuro: alert and oriented x 3, no tremor MSK: extremities atraumatic, normal gait and station, no peripheral edema Skin: intact, no rashes on exposed skin,  no jaundice, no cyanosis Psych: well-groomed, cooperative, good eye contact, euthymic mood, affect mood-congruent, speech is articulate, and thought processes clear and goal-directed  Diabetic Foot Exam - Simple   No data filed       No results found for this or any previous visit (from the past 72 hour(s)). No results found.    Assessment and Plan: 52 y.o. female with   Migraine without aura and without status migrainosus, not intractable - Plan: rizatriptan (MAXALT) 10 MG tablet, metoCLOPramide (REGLAN) 10 MG tablet, EMGALITY 120 MG/ML SOAJ, DISCONTINUED: metoCLOPramide (REGLAN) 10 MG tablet, DISCONTINUED: rizatriptan (MAXALT) 10 MG tablet  Uncontrolled type 2 diabetes mellitus with hyperglycemia (H[facility]) - Plan: metFORMIN (GLUCOPHAGE) 1000 MG tablet, lisinopril (PRINIVIL,ZESTRIL) 10 MG tablet, Insulin Glargine 300 UNIT/ML SOPN, glucose blood test strip, POCT HgB A1C, Dulaglutide (TRULICITY) 3.47 QQ/5.9DG SOPN, DISCONTINUED: Insulin Glargine 300 UNIT/ML SOPN, DISCONTINUED: metFORMIN (GLUCOPHAGE) 1000 MG tablet, DISCONTINUED: glucose blood test strip, DISCONTINUED: Dulaglutide (TRULICITY) 3.87 FI/4.3PI SOPN  Hyperlipidemia associated with type 2 diabetes mellitus ([place]) - Plan: atorvastatin (LIPITOR) 20 MG tablet  Perimenopausal menorrhagia - Plan: Ambulatory referral to Obstetrics / Gynecology  Uncontolled Type 2 DM Lab Results  Component Value Date   HGBA1C 9.3 (A) 09/30/2017  - poor glycemic control. Patient has refused medication changes in the past. She is on max dose Metformin and upper end of Toujeo. I would like to decrease her total insulin requirement, especially in the setting of morbid obesity. Discussed add-on therapies. Patient was amenable to Trulicity. Instructed on use of pen and self-titration - counseled on ADA carb-restricted diet - she is not compliant with her statin or ACE. Re-iterated clinical reasoning for these medications and encouraged her to take them  daily  Migraines - she is a candidate for migraine prophylaxis. She has failed Pamelor and Topamax. I think she would do well with Emgality - continue Maxalt 10 mg prn and Reglan 10 mg prn - limit triptan to no more than 6 days per month - counseled on migraine lifestyle changes - headache diary   Patient education and anticipatory guidance given Patient agrees with treatment plan Follow-up in 3 months for diabetes and migraines or sooner as needed if symptoms worsen or fail to improve  Darlyne Russian PA-C

## 2017-10-05 ENCOUNTER — Telehealth: Payer: Self-pay | Admitting: Physician Assistant

## 2017-10-05 DIAGNOSIS — G43009 Migraine without aura, not intractable, without status migrainosus: Secondary | ICD-10-CM

## 2017-10-05 MED ORDER — GALCANEZUMAB-GNLM 120 MG/ML ~~LOC~~ SOAJ: 240.0000 mg | Freq: Once | SUBCUTANEOUS | 0 refills | Status: DC

## 2017-10-05 NOTE — Telephone Encounter (Signed)
We actually received an approval letter from [place]. This medication required a Prior Auth. It was not denied. I did send a script to her local, but she should be able to get this through [facility] now. I would prefer that we keep as many of her medications with one pharmacy to simplify the refill process. She also should activate the Emgality discount coupon before presenting it to the pharmacy

## 2017-10-05 NOTE — Telephone Encounter (Signed)
Pt called to let us kno that there was amix up with her new script for ememgality. Script was sent to her mail in pharmacy, however it was denied. She is requesting that it be sent to [facility] on [address].

## 2017-10-05 NOTE — Telephone Encounter (Signed)
Pt notified of PA approval.  -EH/RMA

## 2017-10-13 ENCOUNTER — Encounter: Payer: Self-pay | Admitting: Physician Assistant

## 2017-10-13 DIAGNOSIS — Z9111 Patient's noncompliance with dietary regimen: Secondary | ICD-10-CM | POA: Insufficient documentation

## 2017-10-13 DIAGNOSIS — Z91199 Patient's noncompliance with other medical treatment and regimen due to unspecified reason: Secondary | ICD-10-CM | POA: Insufficient documentation

## 2017-10-13 DIAGNOSIS — N924 Excessive bleeding in the premenopausal period: Secondary | ICD-10-CM | POA: Insufficient documentation

## 2017-10-25 ENCOUNTER — Other Ambulatory Visit: Payer: Self-pay | Admitting: Physician Assistant

## 2017-10-25 DIAGNOSIS — E1165 Type 2 diabetes mellitus with hyperglycemia: Secondary | ICD-10-CM

## 2017-10-28 ENCOUNTER — Other Ambulatory Visit: Payer: Self-pay | Admitting: Physician Assistant

## 2017-10-28 DIAGNOSIS — E1165 Type 2 diabetes mellitus with hyperglycemia: Secondary | ICD-10-CM

## 2017-10-30 ENCOUNTER — Other Ambulatory Visit: Payer: Self-pay | Admitting: Physician Assistant

## 2017-10-30 DIAGNOSIS — E1165 Type 2 diabetes mellitus with hyperglycemia: Secondary | ICD-10-CM

## 2017-11-06 ENCOUNTER — Other Ambulatory Visit: Payer: Self-pay | Admitting: Physician Assistant

## 2017-11-06 DIAGNOSIS — G43009 Migraine without aura, not intractable, without status migrainosus: Secondary | ICD-10-CM

## 2017-11-07 ENCOUNTER — Other Ambulatory Visit: Payer: Self-pay | Admitting: Physician Assistant

## 2017-11-07 DIAGNOSIS — G43009 Migraine without aura, not intractable, without status migrainosus: Secondary | ICD-10-CM

## 2017-11-07 MED ORDER — GALCANEZUMAB-GNLM 120 MG/ML ~~LOC~~ SOAJ: 120.0000 mg | SUBCUTANEOUS | 5 refills | Status: DC

## 2017-11-07 NOTE — Addendum Note (Signed)
Addended by: Nelson Chimes E on: 11/07/2017 05:44 PM   Modules accepted: Orders

## 2017-12-19 DIAGNOSIS — M1711 Unilateral primary osteoarthritis, right knee: Secondary | ICD-10-CM | POA: Diagnosis not present

## 2017-12-19 DIAGNOSIS — M25561 Pain in right knee: Secondary | ICD-10-CM | POA: Diagnosis not present

## 2017-12-28 ENCOUNTER — Emergency Department (INDEPENDENT_AMBULATORY_CARE_PROVIDER_SITE_OTHER)
Admission: EM | Admit: 2017-12-28 | Discharge: 2017-12-28 | Disposition: A | Discharge status: Home / Self Care | Payer: BLUE CROSS/BLUE SHIELD | Source: Home / Self Care | Attending: Family Medicine | Admitting: Family Medicine

## 2017-12-28 ENCOUNTER — Encounter: Payer: Self-pay | Admitting: Emergency Medicine

## 2017-12-28 ENCOUNTER — Other Ambulatory Visit: Payer: Self-pay | Admitting: Family Medicine

## 2017-12-28 ENCOUNTER — Other Ambulatory Visit: Payer: Self-pay

## 2017-12-28 DIAGNOSIS — N760 Acute vaginitis: Secondary | ICD-10-CM

## 2017-12-28 DIAGNOSIS — B373 Candidiasis of vulva and vagina: Secondary | ICD-10-CM

## 2017-12-28 DIAGNOSIS — N898 Other specified noninflammatory disorders of vagina: Secondary | ICD-10-CM | POA: Diagnosis not present

## 2017-12-28 DIAGNOSIS — B3731 Acute candidiasis of vulva and vagina: Secondary | ICD-10-CM

## 2017-12-28 DIAGNOSIS — N3000 Acute cystitis without hematuria: Secondary | ICD-10-CM

## 2017-12-28 LAB — POCT URINALYSIS DIP (MANUAL ENTRY)
Bilirubin, UA: NEGATIVE
Blood, UA: NEGATIVE
Glucose, UA: 500 mg/dL — AB
Nitrite, UA: NEGATIVE
Protein Ur, POC: 30 mg/dL — AB
Spec Grav, UA: >=1.03 — AB (ref 1.010–1.025)
Urobilinogen, UA: 0.2 E.U./dL
pH, UA: 6 (ref 5.0–8.0)

## 2017-12-28 LAB — POCT FASTING CBG KUC MANUAL ENTRY: POCT Glucose (KUC): 211 mg/dL — AB (ref 70–99)

## 2017-12-28 MED ORDER — FLUCONAZOLE 150 MG PO TABS: 150.0000 mg | ORAL_TABLET | Freq: Once | ORAL | 0 refills | Status: AC

## 2017-12-28 MED ORDER — CEPHALEXIN 500 MG PO CAPS: 500.0000 mg | ORAL_CAPSULE | Freq: Two times a day (BID) | ORAL | 0 refills | Status: DC

## 2017-12-28 NOTE — ED Triage Notes (Signed)
Patient reports vaginal itching for past 3-4 days getting progressively worse despite OTC vagisil.

## 2017-12-28 NOTE — Discharge Instructions (Signed)
°  Please take antibiotics as prescribed and be sure to complete entire course even if you start to feel better to ensure infection does not come back.  Please take your antibiotic as prescribed. A urine culture has been sent to check the severity of your urinary infection and to determine if you are on the most appropriate antibiotic. The results should come back within 2-3 days and you will be notified even if no medication change is needed.  Please stay well hydrated and follow up with your family doctor in 1 week if not improving, sooner if worsening.

## 2017-12-28 NOTE — ED Provider Notes (Signed)
[name]    CSN: [id number] Arrival date & time: 12/28/17  1823     History   Chief Complaint Chief Complaint  Patient presents with  . Vaginal Itching    HPI Morgan Duran is a 52 y.o. female.   HPI Morgan Duran is a 52 y.o. female presenting to UC with c/o 3-4 days of vaginal itching, burning and irritation. Symptoms gradually worsening despite trying OTC Vagisil cream. She is also c/o lower abdominal pain and pressure. Denies fever, chills, n/v/d.  Denies vaginal discharge. She has had vaginal yeast infections in the past and notes she has had "sugar" in her urine before.  No recent antibiotic use. Her CBG yesterday was 268.  No hx of kidney stones. Denies concern for STIs but states she would be okay with being checked.    Past Medical History:  Diagnosis Date  . Anxiety   . Arthritis   . Diabetes mellitus without complication ([place])   . Heart murmur   . Hyperlipidemia   . Hypertension   . Migraine   . Morbid obesity ([place])   . OSA on CPAP     Patient Active Problem List   Diagnosis Date Noted  . Noncompliance with treatment plan 10/13/2017  . Perimenopausal menorrhagia 10/13/2017  . Migraine without aura and without status migrainosus, not intractable 09/30/2017  . Refused pneumococcal vaccination 04/03/2017  . Refused influenza vaccine 04/03/2017  . Class 3 severe obesity due to excess calories with serious comorbidity and body mass index (BMI) of 45.0 to 49.9 in adult ([place]) 04/03/2017  . Hypertension goal BP (blood pressure) < 130/80 04/03/2017  . DJD (degenerative joint disease) 03/23/2017  . Aortic stenosis, mild 03/23/2017  . Controlled type 2 diabetes mellitus ([place]) 01/21/2017  . OSA on CPAP 01/15/2017  . Systolic murmur 93/81/8299  . Hyperlipidemia associated with type 2 diabetes mellitus ([place]) 01/15/2017  . Migraine 01/15/2017  . Noncompliance with medications 10/10/2016  . Metrorrhagia 05/31/2016    Past Surgical History:  Procedure  Laterality Date  . KNEE ARTHROSCOPY    . NASAL SEPTUM SURGERY    . SHOULDER ARTHROSCOPY    . TONSILLECTOMY      OB History   None      Home Medications    Prior to Admission medications   Medication Sig Start Date End Date Taking? Authorizing Provider  aspirin EC 81 MG tablet Take 1 tablet (81 mg total) by mouth daily. 01/07/17   Trixie Dredge, PA-C  atorvastatin (LIPITOR) 20 MG tablet Take 1 tablet (20 mg total) by mouth at bedtime. 09/30/17   Trixie Dredge, PA-C  cephALEXin (KEFLEX) 500 MG capsule Take 1 capsule (500 mg total) by mouth 2 (two) times daily. 12/28/17   Noe Gens, PA-C  Galcanezumab-gnlm (EMGALITY) 120 MG/ML SOAJ Inject 120 mg as directed every 30 (thirty) days. 11/07/17   Trixie Dredge, PA-C  glucose blood (ONE TOUCH ULTRA TEST) test strip USE AS INSTRUCTED 10/25/17   Trixie Dredge, PA-C  glucose blood test strip Use as instructed 09/30/17   Trixie Dredge, PA-C  Insulin Glargine 300 UNIT/ML SOPN Inject 38 Units into the skin at bedtime. 09/30/17   Trixie Dredge, PA-C  lisinopril (PRINIVIL,ZESTRIL) 10 MG tablet Take 1 tablet (10 mg total) by mouth daily. 09/30/17   Trixie Dredge, PA-C  metFORMIN (GLUCOPHAGE) 1000 MG tablet Take 1 tablet (1,000 mg total) by mouth 2 (two) times daily with a meal. 09/30/17   Nelson Chimes  Elizabeth, PA-C  metFORMIN (GLUCOPHAGE) 1000 MG tablet TAKE 1 TABLET (1,000 MG TOTAL) BY MOUTH 2 (TWO) TIMES DAILY WITH A MEAL. 11/01/17   Trixie Dredge, PA-C  metoCLOPramide (REGLAN) 10 MG tablet Take 1 tablet (10 mg total) by mouth every 6 (six) hours as needed for nausea or vomiting. 09/30/17   Trixie Dredge, PA-C  Multiple Vitamins-Minerals (MULTIVITAMIN WOMEN PO) Take by mouth.    [provider]  Naproxen Sodium (ALEVE PO) Take by mouth.    [provider]  rizatriptan (MAXALT) 10 MG tablet Take 1 tablet (10 mg  total) by mouth as needed for migraine. May repeat in 2 hours if needed 09/30/17   Trixie Dredge, PA-C  TOUJEO MAX SOLOSTAR 300 UNIT/ML [facility] INJECT 38 UNITS INTO THE SKIN AT BEDTIME. 11/01/17   Trixie Dredge, PA-C  TRULICITY 9.83 JA/2.5KN SOPN INJECT 0.5 MLS INTO THE SKIN ONCE A WEEK. 10/28/17   Trixie Dredge, PA-C    Family History Family History  Problem Relation Age of Onset  . Emphysema Mother   . Diabetes Father     Social History Social History   Tobacco Use  . Smoking status: Former Research scientist (life sciences)  . Smokeless tobacco: Never Used  Substance Use Topics  . Alcohol use: Yes  . Drug use: No     Allergies   Patient has no known allergies.   Review of Systems Review of Systems  Gastrointestinal: Positive for abdominal pain and nausea. Negative for diarrhea and vomiting.  Genitourinary: Positive for dysuria, pelvic pain and vaginal pain. Negative for frequency, urgency and vaginal discharge.  Musculoskeletal: Negative for back pain and myalgias.     Physical Exam Triage Vital Signs ED Triage Vitals  Enc Vitals Group     BP 12/28/17 1858 112/75     Pulse Rate 12/28/17 1858 90     Resp 12/28/17 1858 18     Temp 12/28/17 1858 98.6 F (37 C)     Temp Source 12/28/17 1858 Oral     SpO2 12/28/17 1858 98 %     Weight 12/28/17 1859 280 lb (127 kg)     Height 12/28/17 1859 5\' 5"  (1.651 m)     Head Circumference --      Peak Flow --      Pain Score 12/28/17 1859 2     Pain Loc --      Pain Edu? --      Excl. in [place]? --    No data found.  Updated Vital Signs BP 112/75 (BP Location: Right Arm)   Pulse 90   Temp 98.6 F (37 C) (Oral)   Resp 18   Ht 5\' 5"  (1.651 m)   Wt 280 lb (127 kg)   LMP 12/01/2017 (Exact Date)   SpO2 98%   BMI 46.59 kg/m   Visual Acuity Right Eye Distance:   Left Eye Distance:   Bilateral Distance:    Right Eye Near:   Left Eye Near:    Bilateral Near:     Physical Exam  Constitutional: She is  oriented to person, place, and time. She appears well-developed and well-nourished. No distress.  HENT:  Head: Normocephalic and atraumatic.  Mouth/Throat: Oropharynx is clear and moist.  Eyes: EOM are normal.  Neck: Normal range of motion.  Cardiovascular: Normal rate and regular rhythm.  Pulmonary/Chest: Effort normal and breath sounds normal. No stridor. No respiratory distress. She has no wheezes. She has no rales.  Genitourinary: Cervix exhibits no  motion tenderness, no discharge and no friability. There is erythema in the vagina. No bleeding in the vagina. Vaginal discharge ( copious amount of white thick discharge) found.  Genitourinary Comments: Chaperoned exam. Erythema with mild edema at vaginal opening. Copious thick white discharge in vaginal canal. No bleeding.   Musculoskeletal: Normal range of motion.  Neurological: She is alert and oriented to person, place, and time.  Skin: Skin is warm and dry. She is not diaphoretic.  Psychiatric: She has a normal mood and affect. Her behavior is normal.  Nursing note and vitals reviewed.    UC Treatments / Results  Labs (all labs ordered are listed, but only abnormal results are displayed) Labs Reviewed  POCT URINALYSIS DIP (MANUAL ENTRY) - Abnormal; Notable for the following components:      Result Value   Glucose, UA =500 (*)    Ketones, POC UA small (15) (*)    Spec Grav, UA >=1.030 (*)    Protein Ur, POC =30 (*)    Leukocytes, UA Small (1+) (*)    All other components within normal limits  POCT FASTING CBG [facility] MANUAL ENTRY - Abnormal; Notable for the following components:   POCT Glucose ([facility]) 211 (*)    All other components within normal limits  URINE CULTURE  CERVICOVAGINAL ANCILLARY ONLY    EKG None  Radiology No results found.  Procedures Procedures (including critical care time)  Medications Ordered in UC Medications - No data to display  Initial Impression / Assessment and Plan / UC Course  I have  reviewed the triage vital signs and the nursing notes.  Pertinent labs & imaging results that were available during my care of the patient were reviewed by me and considered in my medical decision making (see chart for details).     Hx and UA c/w likely UTI Urine culture sent Vaginal exam c/w vaginal yeast infection. Will start pt on keflex and diflucan  Final Clinical Impressions(s) / UC Diagnoses   Final diagnoses:  Vaginal itching  Vaginal yeast infection  Vaginitis and vulvovaginitis  Acute cystitis without hematuria     Discharge Instructions      Please take antibiotics as prescribed and be sure to complete entire course even if you start to feel better to ensure infection does not come back.  Please take your antibiotic as prescribed. A urine culture has been sent to check the severity of your urinary infection and to determine if you are on the most appropriate antibiotic. The results should come back within 2-3 days and you will be notified even if no medication change is needed.  Please stay well hydrated and follow up with your family doctor in 1 week if not improving, sooner if worsening.     ED Prescriptions    Medication Sig Dispense Auth. Provider   cephALEXin (KEFLEX) 500 MG capsule Take 1 capsule (500 mg total) by mouth 2 (two) times daily. 14 capsule Gerarda Fraction, Porfirio Bollier O, PA-C   fluconazole (DIFLUCAN) 150 MG tablet Take 1 tablet (150 mg total) by mouth once for 1 dose. May repeat in 3 days if still having symptoms. 2 tablet Noe Gens, PA-C     Controlled Substance Prescriptions [place] Controlled Substance Registry consulted? Not Applicable   Noe Gens, PA-C 12/29/17 1049

## 2017-12-29 ENCOUNTER — Telehealth: Payer: Self-pay | Admitting: Emergency Medicine

## 2017-12-29 LAB — WET PREP BY MOLECULAR PROBE

## 2017-12-29 LAB — C. TRACHOMATIS/N. GONORRHOEAE RNA
C. trachomatis RNA, TMA: NOT DETECTED
N. gonorrhoeae RNA, TMA: NOT DETECTED

## 2017-12-29 LAB — URINE CULTURE
MICRO NUMBER:: 91153518
SPECIMEN QUALITY:: ADEQUATE

## 2017-12-29 LAB — TRICHOMONAS VAGINALIS, PROBE AMP: Trichomonas vaginalis RNA: NOT DETECTED

## 2017-12-29 NOTE — Telephone Encounter (Signed)
BD Affirm appears to have been canceled by lab. Lab reordered.

## 2017-12-30 ENCOUNTER — Telehealth: Payer: Self-pay | Admitting: Emergency Medicine

## 2017-12-30 NOTE — Telephone Encounter (Signed)
Called [facility], they did not receive the specimen, it is also not listed on the log sheet.

## 2017-12-30 NOTE — Telephone Encounter (Signed)
[facility] did not receive the sample [facility] received the sample but cancelled the BV/Vaginitis Panel because they needed an Affirm swab and they received an Aptima swab

## 2017-12-31 ENCOUNTER — Telehealth: Payer: Self-pay | Admitting: Emergency Medicine

## 2017-12-31 NOTE — Telephone Encounter (Signed)
Left message to call us for lab results.

## 2017-12-31 NOTE — Telephone Encounter (Signed)
Spoke with patient; she is gradually becoming more comfortable; has taken second dose of diflucan; using desitin as suggested for rash area per Leeroy Cha. Gave her lab results: no signs UTI, yeast upon exam; neg STDs. Told her BV not done but per Dr.Beese is not major concern unless she does not improve at which time another swab would be available.

## 2018-03-17 ENCOUNTER — Encounter: Payer: Self-pay | Admitting: Emergency Medicine

## 2018-03-17 ENCOUNTER — Emergency Department (INDEPENDENT_AMBULATORY_CARE_PROVIDER_SITE_OTHER)
Admission: EM | Admit: 2018-03-17 | Discharge: 2018-03-17 | Disposition: A | Discharge status: Home / Self Care | Payer: BLUE CROSS/BLUE SHIELD | Source: Home / Self Care

## 2018-03-17 ENCOUNTER — Other Ambulatory Visit: Payer: Self-pay

## 2018-03-17 DIAGNOSIS — M25561 Pain in right knee: Secondary | ICD-10-CM | POA: Diagnosis not present

## 2018-03-17 MED ORDER — MELOXICAM 7.5 MG PO TABS: 7.5000 mg | ORAL_TABLET | Freq: Every day | ORAL | 0 refills | Status: DC

## 2018-03-17 NOTE — Discharge Instructions (Signed)
°  Tonight and this weekend, try to rest your knee by wrapping with an ace bandage, elevating and applying a cool compress 2-3 times daily for 15-20 minutes at a time.   Meloxicam (Mobic) is an antiinflammatory to help with pain and inflammation.  Do not take ibuprofen, Advil, Aleve, or any other medications that contain NSAIDs while taking meloxicam as this may cause stomach upset or even ulcers if taken in large amounts for an extended period of time.   You may also continue to take your tramadol as prescribed  Please call on Monday to schedule an appointment with your family doctor or orthopedist for further evaluation and treatment of your knee pain.

## 2018-03-17 NOTE — ED Provider Notes (Signed)
[name]    CSN: [id number] Arrival date & time: 03/17/18  1826     History   Chief Complaint Chief Complaint  Patient presents with  . Knee Pain    HPI Farhiya Rosten is a 52 y.o. female.   HPI  Madalen Gavin is a 52 y.o. female presenting to UC with c/o 2 days of Right knee pain that started after doing a lot of walking the other day at [place], a Christmas lights display.  Pain is aching and sore, sharp at times when crawling on the floor with her child.  She too has tried Aleve and took tramadol at 4AM but no relief. She has a velcro knee brace but states she cannot wear at work because it makes it too difficult to walk.  She does get knee joint injections and is about due for her next one.  No specific known injury.   Past Medical History:  Diagnosis Date  . Anxiety   . Arthritis   . Diabetes mellitus without complication ([place])   . Heart murmur   . Hyperlipidemia   . Hypertension   . Migraine   . Morbid obesity ([place])   . OSA on CPAP     Patient Active Problem List   Diagnosis Date Noted  . Noncompliance with treatment plan 10/13/2017  . Perimenopausal menorrhagia 10/13/2017  . Migraine without aura and without status migrainosus, not intractable 09/30/2017  . Refused pneumococcal vaccination 04/03/2017  . Refused influenza vaccine 04/03/2017  . Class 3 severe obesity due to excess calories with serious comorbidity and body mass index (BMI) of 45.0 to 49.9 in adult ([place]) 04/03/2017  . Hypertension goal BP (blood pressure) < 130/80 04/03/2017  . DJD (degenerative joint disease) 03/23/2017  . Aortic stenosis, mild 03/23/2017  . Controlled type 2 diabetes mellitus ([place]) 01/21/2017  . OSA on CPAP 01/15/2017  . Systolic murmur 70/62/3762  . Hyperlipidemia associated with type 2 diabetes mellitus ([place]) 01/15/2017  . Migraine 01/15/2017  . Noncompliance with medications 10/10/2016  . Metrorrhagia 05/31/2016    Past Surgical History:  Procedure  Laterality Date  . KNEE ARTHROSCOPY    . NASAL SEPTUM SURGERY    . SHOULDER ARTHROSCOPY    . TONSILLECTOMY      OB History   No obstetric history on file.      Home Medications    Prior to Admission medications   Medication Sig Start Date End Date Taking? Authorizing Provider  aspirin EC 81 MG tablet Take 1 tablet (81 mg total) by mouth daily. 01/07/17   Trixie Dredge, PA-C  atorvastatin (LIPITOR) 20 MG tablet Take 1 tablet (20 mg total) by mouth at bedtime. 09/30/17   Trixie Dredge, PA-C  cephALEXin (KEFLEX) 500 MG capsule Take 1 capsule (500 mg total) by mouth 2 (two) times daily. 12/28/17   Noe Gens, PA-C  Galcanezumab-gnlm (EMGALITY) 120 MG/ML SOAJ Inject 120 mg as directed every 30 (thirty) days. 11/07/17   Trixie Dredge, PA-C  glucose blood (ONE TOUCH ULTRA TEST) test strip USE AS INSTRUCTED 10/25/17   Trixie Dredge, PA-C  glucose blood test strip Use as instructed 09/30/17   Trixie Dredge, PA-C  Insulin Glargine 300 UNIT/ML SOPN Inject 38 Units into the skin at bedtime. 09/30/17   Trixie Dredge, PA-C  lisinopril (PRINIVIL,ZESTRIL) 10 MG tablet Take 1 tablet (10 mg total) by mouth daily. 09/30/17   Trixie Dredge, PA-C  meloxicam (MOBIC) 7.5 MG tablet Take 1-2  tablets (7.5-15 mg total) by mouth daily. 03/17/18   Noe Gens, PA-C  metFORMIN (GLUCOPHAGE) 1000 MG tablet Take 1 tablet (1,000 mg total) by mouth 2 (two) times daily with a meal. 09/30/17   Trixie Dredge, PA-C  metFORMIN (GLUCOPHAGE) 1000 MG tablet TAKE 1 TABLET (1,000 MG TOTAL) BY MOUTH 2 (TWO) TIMES DAILY WITH A MEAL. 11/01/17   Trixie Dredge, PA-C  metoCLOPramide (REGLAN) 10 MG tablet Take 1 tablet (10 mg total) by mouth every 6 (six) hours as needed for nausea or vomiting. 09/30/17   Trixie Dredge, PA-C  Multiple Vitamins-Minerals (MULTIVITAMIN WOMEN PO) Take by mouth.    [provider]  Naproxen Sodium (ALEVE PO) Take by mouth.    [provider]  rizatriptan (MAXALT) 10 MG tablet Take 1 tablet (10 mg total) by mouth as needed for migraine. May repeat in 2 hours if needed 09/30/17   Trixie Dredge, PA-C  TOUJEO MAX SOLOSTAR 300 UNIT/ML [facility] INJECT 38 UNITS INTO THE SKIN AT BEDTIME. 11/01/17   Trixie Dredge, PA-C  TRULICITY 1.49 FW/2.6VZ SOPN INJECT 0.5 MLS INTO THE SKIN ONCE A WEEK. 10/28/17   Trixie Dredge, PA-C    Family History Family History  Problem Relation Age of Onset  . Emphysema Mother   . Diabetes Father     Social History Social History   Tobacco Use  . Smoking status: Former Research scientist (life sciences)  . Smokeless tobacco: Never Used  Substance Use Topics  . Alcohol use: Yes  . Drug use: No     Allergies   Patient has no known allergies.   Review of Systems Review of Systems  Musculoskeletal: Positive for arthralgias and myalgias.  Skin: Negative for color change and wound.  Neurological: Negative for weakness and numbness.     Physical Exam Triage Vital Signs ED Triage Vitals  Enc Vitals Group     BP 03/17/18 1847 (!) 171/94     Pulse Rate 03/17/18 1847 84     Resp 03/17/18 1847 16     Temp 03/17/18 1847 98.1 F (36.7 C)     Temp Source 03/17/18 1847 Oral     SpO2 03/17/18 1847 98 %     Weight 03/17/18 1848 250 lb (113.4 kg)     Height 03/17/18 1848 5\' 4"  (1.626 m)     Head Circumference --      Peak Flow --      Pain Score 03/17/18 1848 9     Pain Loc --      Pain Edu? --      Excl. in [place]? --    No data found.  Updated Vital Signs BP (!) 171/94 (BP Location: Right Arm)   Pulse 84   Temp 98.1 F (36.7 C) (Oral)   Resp 16   Ht 5\' 4"  (1.626 m)   Wt 250 lb (113.4 kg)   SpO2 98%   BMI 42.91 kg/m   Visual Acuity Right Eye Distance:   Left Eye Distance:   Bilateral Distance:    Right Eye Near:   Left Eye Near:    Bilateral Near:     Physical Exam Vitals signs and  nursing note reviewed.  Constitutional:      Appearance: She is well-developed.  HENT:     Head: Normocephalic and atraumatic.  Neck:     Musculoskeletal: Normal range of motion.  Cardiovascular:     Rate and Rhythm: Normal rate.  Pulmonary:  Effort: Pulmonary effort is normal.  Musculoskeletal: Normal range of motion.        General: Tenderness present.     Comments: Diffuse tenderness to Right knee.   Skin:    General: Skin is warm and dry.     Comments: Right knee: skin in tact. No ecchymosis or erythema.   Neurological:     Mental Status: She is alert and oriented to person, place, and time.  Psychiatric:        Behavior: Behavior normal.      UC Treatments / Results  Labs (all labs ordered are listed, but only abnormal results are displayed) Labs Reviewed - No data to display  EKG None  Radiology No results found.  Procedures Procedures (including critical care time)  Medications Ordered in UC Medications - No data to display  Initial Impression / Assessment and Plan / UC Course  I have reviewed the triage vital signs and the nursing notes.  Pertinent labs & imaging results that were available during my care of the patient were reviewed by me and considered in my medical decision making (see chart for details).     Hx and exam c/w right knee strain from recent increased walking. Will have pt try Meloxicam and conservative treatment this weekend. Encouraged f/u with her orthopedist next week.  Final Clinical Impressions(s) / UC Diagnoses   Final diagnoses:  Acute pain of right knee     Discharge Instructions      Tonight and this weekend, try to rest your knee by wrapping with an ace bandage, elevating and applying a cool compress 2-3 times daily for 15-20 minutes at a time.   Meloxicam (Mobic) is an antiinflammatory to help with pain and inflammation.  Do not take ibuprofen, Advil, Aleve, or any other medications that contain NSAIDs while  taking meloxicam as this may cause stomach upset or even ulcers if taken in large amounts for an extended period of time.   You may also continue to take your tramadol as prescribed  Please call on Monday to schedule an appointment with your family doctor or orthopedist for further evaluation and treatment of your knee pain.    ED Prescriptions    Medication Sig Dispense Auth. Provider   meloxicam (MOBIC) 7.5 MG tablet Take 1-2 tablets (7.5-15 mg total) by mouth daily. 30 tablet Noe Gens, PA-C     Controlled Substance Prescriptions [place] Controlled Substance Registry consulted? Not Applicable   Tyrell Antonio 03/17/18 1901

## 2018-03-17 NOTE — ED Triage Notes (Signed)
Patient reports onset of more pain than usual in her right knee which has arthritis over past 2 days; no known injury but extra movement during care of child. Last pain med at 0400; tramadol 50mg  po.

## 2018-03-20 DIAGNOSIS — M25561 Pain in right knee: Secondary | ICD-10-CM | POA: Diagnosis not present

## 2018-04-14 ENCOUNTER — Emergency Department (INDEPENDENT_AMBULATORY_CARE_PROVIDER_SITE_OTHER)
Admission: EM | Admit: 2018-04-14 | Discharge: 2018-04-14 | Disposition: A | Discharge status: Home / Self Care | Payer: BLUE CROSS/BLUE SHIELD | Source: Home / Self Care | Attending: Emergency Medicine | Admitting: Emergency Medicine

## 2018-04-14 ENCOUNTER — Other Ambulatory Visit: Payer: Self-pay

## 2018-04-14 ENCOUNTER — Encounter: Payer: Self-pay | Admitting: *Deleted

## 2018-04-14 DIAGNOSIS — J209 Acute bronchitis, unspecified: Secondary | ICD-10-CM

## 2018-04-14 MED ORDER — METHYLPREDNISOLONE ACETATE 80 MG/ML IJ SUSP
80.0000 mg | Freq: Once | INTRAMUSCULAR | Status: AC
  Administered 2018-04-14: 80 mg via INTRAMUSCULAR

## 2018-04-14 MED ORDER — AZITHROMYCIN 250 MG PO TABS: ORAL_TABLET | ORAL | 0 refills | Status: DC

## 2018-04-14 MED ORDER — IPRATROPIUM-ALBUTEROL 0.5-2.5 (3) MG/3ML IN SOLN
3.0000 mL | RESPIRATORY_TRACT | Status: AC
  Administered 2018-04-14: 3 mL via RESPIRATORY_TRACT

## 2018-04-14 MED ORDER — PREDNISONE 20 MG PO TABS: 20.0000 mg | ORAL_TABLET | Freq: Two times a day (BID) | ORAL | 0 refills | Status: DC

## 2018-04-14 MED ORDER — BENZONATATE 200 MG PO CAPS: ORAL_CAPSULE | ORAL | 0 refills | Status: DC

## 2018-04-14 NOTE — Discharge Instructions (Addendum)
Diagnosis is bronchitis with bronchospasm. Here in urgent care, we gave you DuoNeb bronchodilator treatment, which helped. We also gave you a shot of Depo-Medrol, which is a type of cortisone. I have sent prescriptions for Z-Pak for antibiotic coverage, prednisone, Tessalon for cough, to your pharmacy. Be sure to make an appointment to follow-up with Morgan Duran, your PCP in 1 week, or seek medical care sooner if any worsening symptoms. Please read attached instruction sheets on bronchitis and bronchospasm/wheezing.

## 2018-04-14 NOTE — ED Triage Notes (Signed)
Patient c/o 3 days of cough and congestion. Fever on 1st day. Taken day/nyquil, tylenol and generic cough and cold otc

## 2018-04-14 NOTE — ED Provider Notes (Signed)
[facility] C[facility]    CSN: [id number] Arrival date & time: 04/14/18  1303     History   Chief Complaint Chief Complaint  Patient presents with  . Cough  . Nasal Congestion  cc's: Chest congestion/wheezing/cough  HPI Morgan Duran is a 53 y.o. female.  Established patient HPI About 6 or 7 days ago, started with sinus and URI symptoms that have progressed to cough productive of discolored sputum with severe chest congestion and severe hacking cough.  The cough is only occasionally productive of discolored sputum, and sometimes is dry.  It keeps her up at night.  Associated with fever and chills, T-max 101.  Tried NyQuil at night, that helped somewhat. She recalls in the past having episodes of severe bronchitis requiring a DuoNeb inhaler, but she states that she does not have asthma, only occasional wheezing when she gets bronchitis once or twice a year.  In our records, last visit here in urgent care for respiratory problem was severe bronchospasm and bronchitis February 2018, treated with DuoNeb, steroid and antibiotic.  Reviewed past medical history below which includes type 2 diabetes and hypertension and OSA on CPAP. She has refused influenza and pneumococcal vaccinations in the past.  Associated symptoms: Occasional chills/sweats +  Fever  +  Nasal congestion +  Discolored Post-nasal drainage Positive, sinus pain/pressure No sore throat +  cough Mild wheezing Positive, chest congestion No hemoptysis No shortness of breath No pleuritic pain.  When she tries to take a deep breath, that can cause coughing fit.  No itchy/red eyes No earache  No nausea No vomiting No abdominal pain No diarrhea  No skin rashes +  Fatigue No new myalgias Mild, nonfocal headache .  Denies focal neurologic symptoms.  Past Medical History:  Diagnosis Date  . Anxiety   . Arthritis   . Diabetes mellitus without complication ([place])   . Heart murmur   . Hyperlipidemia   .  Hypertension   . Migraine   . Morbid obesity ([place])   . OSA on CPAP     Patient Active Problem List   Diagnosis Date Noted  . Noncompliance with treatment plan 10/13/2017  . Perimenopausal menorrhagia 10/13/2017  . Migraine without aura and without status migrainosus, not intractable 09/30/2017  . Refused pneumococcal vaccination 04/03/2017  . Refused influenza vaccine 04/03/2017  . Class 3 severe obesity due to excess calories with serious comorbidity and body mass index (BMI) of 45.0 to 49.9 in adult ([place]) 04/03/2017  . Hypertension goal BP (blood pressure) < 130/80 04/03/2017  . DJD (degenerative joint disease) 03/23/2017  . Aortic stenosis, mild 03/23/2017  . Controlled type 2 diabetes mellitus ([place]) 01/21/2017  . OSA on CPAP 01/15/2017  . Systolic murmur 67/34/1936  . Hyperlipidemia associated with type 2 diabetes mellitus ([place]) 01/15/2017  . Migraine 01/15/2017  . Noncompliance with medications 10/10/2016  . Metrorrhagia 05/31/2016    Past Surgical History:  Procedure Laterality Date  . KNEE ARTHROSCOPY    . NASAL SEPTUM SURGERY    . SHOULDER ARTHROSCOPY    . TONSILLECTOMY      OB History   No obstetric history on file.   No LMP recorded. Patient is perimenopausal.    Home Medications    Prior to Admission medications   Medication Sig Start Date End Date Taking? Authorizing Provider  aspirin EC 81 MG tablet Take 1 tablet (81 mg total) by mouth daily. 01/07/17   Trixie Dredge, PA-C  atorvastatin (LIPITOR) 20 MG tablet Take 1  tablet (20 mg total) by mouth at bedtime. 09/30/17   Trixie Dredge, PA-C                Galcanezumab-gnlm [facility]) 120 MG/ML SOAJ Inject 120 mg as directed every 30 (thirty) days. 11/07/17   Trixie Dredge, PA-C  glucose blood (ONE TOUCH ULTRA TEST) test strip USE AS INSTRUCTED 10/25/17   Trixie Dredge, PA-C  glucose blood test strip Use as instructed 09/30/17   Trixie Dredge,  PA-C  Insulin Glargine 300 UNIT/ML SOPN Inject 38 Units into the skin at bedtime. 09/30/17   Trixie Dredge, PA-C  lisinopril (PRINIVIL,ZESTRIL) 10 MG tablet Take 1 tablet (10 mg total) by mouth daily. 09/30/17   Trixie Dredge, PA-C  meloxicam (MOBIC) 7.5 MG tablet Take 1-2 tablets (7.5-15 mg total) by mouth daily. 03/17/18   Noe Gens, PA-C  metFORMIN (GLUCOPHAGE) 1000 MG tablet TAKE 1 TABLET (1,000 MG TOTAL) BY MOUTH 2 (TWO) TIMES DAILY WITH A MEAL. 11/01/17   Trixie Dredge, PA-C  metoCLOPramide (REGLAN) 10 MG tablet Take 1 tablet (10 mg total) by mouth every 6 (six) hours as needed for nausea or vomiting. 09/30/17   Trixie Dredge, PA-C  Multiple Vitamins-Minerals (MULTIVITAMIN WOMEN PO) Take by mouth.    [provider]  Naproxen Sodium (ALEVE PO) Take by mouth.    [provider]         rizatriptan (MAXALT) 10 MG tablet Take 1 tablet (10 mg total) by mouth as needed for migraine. May repeat in 2 hours if needed 09/30/17   Trixie Dredge, PA-C  TOUJEO MAX SOLOSTAR 300 UNIT/ML [facility] INJECT 38 UNITS INTO THE SKIN AT BEDTIME. 11/01/17   Trixie Dredge, PA-C  TRULICITY 7.32 KG/2.5KY SOPN INJECT 0.5 MLS INTO THE SKIN ONCE A WEEK. 10/28/17   Trixie Dredge, PA-C    Family History Family History  Problem Relation Age of Onset  . Emphysema Mother   . Diabetes Father     Social History Social History   Tobacco Use  . Smoking status: Former Research scientist (life sciences)  . Smokeless tobacco: Never Used  Substance Use Topics  . Alcohol use: Yes  . Drug use: No   Former smoker.  Does not currently smoke.   Allergies   Patient has no known allergies.   Review of Systems Review of Systems  All other systems reviewed and are negative.  Pertinent items noted in HPI and remainder of comprehensive ROS otherwise negative.   Physical Exam Triage Vital Signs ED Triage Vitals  Enc Vitals Group     BP  04/14/18 1316 (!) 163/94     Pulse Rate 04/14/18 1316 84     Resp 04/14/18 1316 16     Temp 04/14/18 1316 97.8 F (36.6 C)     Temp Source 04/14/18 1316 Oral     SpO2 04/14/18 1316 96 %     Weight 04/14/18 1317 275 lb (124.7 kg)     Height --      Head Circumference --      Peak Flow --      Pain Score 04/14/18 1316 0     Pain Loc --      Pain Edu? --      Excl. in [place]? --    No data found.  Updated Vital Signs BP (!) 163/94 (BP Location: Right Arm)   Pulse 84   Temp 97.8 F (36.6 C) (Oral)   Resp 16  Wt 124.7 kg   SpO2 96%   BMI 47.20 kg/m    Physical Exam Vitals signs and nursing note reviewed.  Constitutional:      General: She is not in acute distress.    Appearance: She is well-developed. She is ill-appearing. She is not toxic-appearing or diaphoretic.     Comments: Occasional hacking cough noted  HENT:     Head: Normocephalic and atraumatic.     Right Ear: Tympanic membrane, ear canal and external ear normal.     Left Ear: Tympanic membrane, ear canal and external ear normal.     Nose: Mucosal edema and rhinorrhea present.     Right Sinus: Maxillary sinus tenderness present.     Left Sinus: Maxillary sinus tenderness present.     Mouth/Throat:     Mouth: No oral lesions.     Pharynx: No oropharyngeal exudate.  Eyes:     General: No scleral icterus.       Right eye: No discharge.        Left eye: No discharge.  Neck:     Musculoskeletal: Neck supple.  Cardiovascular:     Rate and Rhythm: Normal rate and regular rhythm.     Heart sounds: Normal heart sounds.  Pulmonary:     Effort: Pulmonary effort is normal.     Breath sounds: No stridor. Wheezing (Diffusely, bilaterally) and rhonchi (In all lung fields) present. No rales.     Comments: Hacking cough noted.  O2 saturation 96% room air. Abdominal:     General: Abdomen is protuberant.     Tenderness: There is no abdominal tenderness.     Comments: Obese  Musculoskeletal:        General: No tenderness  or deformity.     Right lower leg: No edema.     Left lower leg: No edema.  Lymphadenopathy:     Cervical: No cervical adenopathy.  Skin:    General: Skin is warm and dry.     Capillary Refill: Capillary refill takes less than 2 seconds.     Findings: No rash.  Neurological:     Mental Status: She is alert and oriented to person, place, and time.     Cranial Nerves: No cranial nerve deficit.  Psychiatric:     Comments: Alert, cooperative      UC Treatments / Results  1:42 PM After initial history and exam, we discussed options and she agrees with ordering DuoNeb nebulizer treatment here in urgent care.  Patient reexamined after DuoNeb treatment.  She felt that her breathing and wheezing was improved.  Lungs: Improved aeration.  Only mild late expiratory wheezes bilaterally.  Oxygen saturation improved to 98% on room air.    Labs (all labs ordered are listed, but only abnormal results are displayed) Labs Reviewed - No data to display  EKG None  Radiology No results found.  Procedures Procedures (including critical care time)  Medications Ordered in UC Medications  ipratropium-albuterol (DUONEB) 0.5-2.5 (3) MG/3ML nebulizer solution 3 mL (3 mLs Nebulization Given 04/14/18 1341)  methylPREDNISolone acetate (DEPO-MEDROL) injection 80 mg (80 mg Intramuscular Given 04/14/18 1410)    Initial Impression / Assessment and Plan / UC Course  I have reviewed the triage vital signs and the nursing notes.  Pertinent labs & imaging results that were available during my care of the patient were reviewed by me and considered in my medical decision making (see chart for details).      Final Clinical Impressions(s) / UC Diagnoses  Final diagnoses:  Acute bronchitis with bronchospasm   Treatment options discussed, as well as risks, benefits, alternatives. Patient voiced understanding and agreement with the following plans: DuoNeb nebulizer treatment given. Wheezing improved.-See  details above. Patient declined chest x-ray. Depo-Medrol 80 mg IM stat. Z-Pak for bacterial/atypical coverage.  Follow-up with your primary care doctor in 1 week or sooner if symptoms become worse. Precautions discussed. Red flags discussed. Questions invited and answered. Patient voiced understanding and agreement. An After Visit Summary was printed and given to the patient.    Discharge Instructions     Diagnosis is bronchitis with bronchospasm. Here in urgent care, we gave you DuoNeb bronchodilator treatment, which helped. We also gave you a shot of Depo-Medrol, which is a type of cortisone. I have sent prescriptions for Z-Pak for antibiotic coverage, prednisone, Tessalon for cough, to your pharmacy. Be sure to make an appointment to follow-up with Charlie, your PCP in 1 week, or seek medical care sooner if any worsening symptoms. Please read attached instruction sheets on bronchitis and bronchospasm/wheezing.    ED Prescriptions    Medication Sig Dispense Auth. Provider   azithromycin (ZITHROMAX Z-PAK) 250 MG tablet Take 2 tablets on day one, then 1 tablet daily on days 2 through 5 1 each Jacqulyn Cane, MD   benzonatate (TESSALON) 200 MG capsule Take 1 every 8 hours as needed for cough. 20 capsule Jacqulyn Cane, MD   predniSONE (DELTASONE) 20 MG tablet Take 1 tablet (20 mg total) by mouth 2 (two) times daily with a meal. X 5 days.  Start taking this tomorrow (Saturday Jan 11) 10 tablet Jacqulyn Cane, MD     Controlled Substance Prescriptions [place] Controlled Substance Registry consulted? Not Applicable   Jacqulyn Cane, MD 04/14/18 1932

## 2018-05-26 ENCOUNTER — Ambulatory Visit (INDEPENDENT_AMBULATORY_CARE_PROVIDER_SITE_OTHER): Payer: BLUE CROSS/BLUE SHIELD | Admitting: Physician Assistant

## 2018-05-26 ENCOUNTER — Telehealth: Payer: Self-pay | Admitting: Physician Assistant

## 2018-05-26 ENCOUNTER — Encounter: Payer: Self-pay | Admitting: Physician Assistant

## 2018-05-26 VITALS — BP 137/84 | HR 93 | Wt 277.0 lb

## 2018-05-26 DIAGNOSIS — E1169 Type 2 diabetes mellitus with other specified complication: Secondary | ICD-10-CM

## 2018-05-26 DIAGNOSIS — E1165 Type 2 diabetes mellitus with hyperglycemia: Secondary | ICD-10-CM | POA: Diagnosis not present

## 2018-05-26 DIAGNOSIS — E1142 Type 2 diabetes mellitus with diabetic polyneuropathy: Secondary | ICD-10-CM | POA: Insufficient documentation

## 2018-05-26 DIAGNOSIS — E785 Hyperlipidemia, unspecified: Secondary | ICD-10-CM

## 2018-05-26 DIAGNOSIS — Z0100 Encounter for examination of eyes and vision without abnormal findings: Secondary | ICD-10-CM

## 2018-05-26 DIAGNOSIS — E1159 Type 2 diabetes mellitus with other circulatory complications: Secondary | ICD-10-CM

## 2018-05-26 DIAGNOSIS — I350 Nonrheumatic aortic (valve) stenosis: Secondary | ICD-10-CM | POA: Diagnosis not present

## 2018-05-26 DIAGNOSIS — E1121 Type 2 diabetes mellitus with diabetic nephropathy: Secondary | ICD-10-CM | POA: Diagnosis not present

## 2018-05-26 DIAGNOSIS — Z1211 Encounter for screening for malignant neoplasm of colon: Secondary | ICD-10-CM

## 2018-05-26 DIAGNOSIS — Z1231 Encounter for screening mammogram for malignant neoplasm of breast: Secondary | ICD-10-CM

## 2018-05-26 DIAGNOSIS — Z91199 Patient's noncompliance with other medical treatment and regimen due to unspecified reason: Secondary | ICD-10-CM

## 2018-05-26 DIAGNOSIS — G43009 Migraine without aura, not intractable, without status migrainosus: Secondary | ICD-10-CM

## 2018-05-26 DIAGNOSIS — Z9111 Patient's noncompliance with dietary regimen: Secondary | ICD-10-CM

## 2018-05-26 DIAGNOSIS — E119 Type 2 diabetes mellitus without complications: Secondary | ICD-10-CM

## 2018-05-26 DIAGNOSIS — I152 Hypertension secondary to endocrine disorders: Secondary | ICD-10-CM

## 2018-05-26 DIAGNOSIS — I10 Essential (primary) hypertension: Secondary | ICD-10-CM

## 2018-05-26 LAB — POCT UA - MICROALBUMIN
Creatinine, POC: 200 mg/dL
Microalbumin Ur, POC: 80 mg/L

## 2018-05-26 LAB — POCT GLYCOSYLATED HEMOGLOBIN (HGB A1C): HbA1c, POC (controlled diabetic range): 9.8 % — AB (ref 0.0–7.0)

## 2018-05-26 MED ORDER — METOCLOPRAMIDE HCL 10 MG PO TABS: 10.0000 mg | ORAL_TABLET | Freq: Four times a day (QID) | ORAL | 2 refills | Status: DC | PRN

## 2018-05-26 MED ORDER — GLUCOSE BLOOD VI STRP: ORAL_STRIP | 1 refills | Status: DC

## 2018-05-26 MED ORDER — INSULIN GLARGINE (2 UNIT DIAL) 300 UNIT/ML ~~LOC~~ SOPN: 40.0000 [IU] | PEN_INJECTOR | Freq: Every day | SUBCUTANEOUS | 2 refills | Status: DC

## 2018-05-26 MED ORDER — DULAGLUTIDE 0.75 MG/0.5ML ~~LOC~~ SOAJ: 0.7500 mg | SUBCUTANEOUS | 0 refills | Status: DC

## 2018-05-26 MED ORDER — GALCANEZUMAB-GNLM 120 MG/ML ~~LOC~~ SOAJ: 120.0000 mg | SUBCUTANEOUS | 11 refills | Status: DC

## 2018-05-26 MED ORDER — METFORMIN HCL ER 500 MG PO TB24: 2000.0000 mg | ORAL_TABLET | Freq: Every day | ORAL | 0 refills | Status: DC

## 2018-05-26 MED ORDER — RIZATRIPTAN BENZOATE 10 MG PO TABS: 10.0000 mg | ORAL_TABLET | ORAL | 2 refills | Status: DC | PRN

## 2018-05-26 NOTE — Patient Instructions (Addendum)
Diabetes Preventive Care: - annual foot exam  - annual dilated eye exam with an eye doctor - self foot exams at least weekly - pneumonia vaccine once (booster in 5 years and at age 53) - annual influenza vaccine - twice yearly dental cleanings and yearly exam - goal blood pressure <140/90, ideally <130/80 - LDL cholesterol <70 - A1C <7.0 - body mass index (BMI) <30  - follow-up every 3 months if your A1C is not at goal - follow-up every 6 months if diabetes is well controlled  Other Preventive Care: - Colon cancer screening (return Cologuard kit within 2 weeks of receiving it. Store in your bathroom so you remember) - Breast cancer screening (call [phone] to schedule or go downstairs to imaging dept.) - Cervical cancer screening (schedule an annual physical with Pap smear)  Diabetic Nephropathy  Diabetic nephropathy is kidney disease that is caused by diabetes (diabetes mellitus). Kidneys are organs that filter and clean blood and get rid of body waste products and extra fluid. Diabetes can cause gradual kidney damage over many years. Diabetic nephropathy that continues to get worse (progress) can lead to kidney failure. What are the causes? This condition is caused by kidney damage from diabetes that is not well controlled with treatment. Having high blood sugar (glucose) for a long time can damage blood vessels in the kidneys and cause them to thicken and become scarred. Those changes prevent the kidneys from functioning normally. What increases the risk? This condition is more likely to develop in people with diabetes who:  Have had diabetes for many years.  Have high blood pressure.  Have high blood glucose levels over a long period of time.  Have a family history of kidney disease.  Have a history of tobacco use.  Have certain genes that are passed from parent to child (inherited).  Are of African-American, Hispanic, Native American, Asian, or Ernest  descent. What are the signs or symptoms? This condition may not cause symptoms at first. If you do have symptoms, they may include:  Swelling of your hands, feet, or ankles.  Weakness.  Poor appetite.  Nausea.  Confusion.  Fatigue.  Trouble sleeping.  Dry, itchy skin. If nephropathy leads to kidney failure, symptoms may include:  Vomiting.  Shortness of breath.  Jerky movements that you cannot control (seizure).  Coma. How is this diagnosed? It is important to diagnose this condition before symptoms develop. You may be screened for diabetic nephropathy at a routine health care visit. Screening tests may include:  Yearly (annual) urine tests.  Urine collection over a 24-hour period to measure kidney function.  Blood tests to measure blood glucose levels and kidney function.  Regular blood pressure monitoring. If your health care provider suspects diabetic nephropathy, he or she may:  Review your medical history and symptoms.  Do a physical exam.  Do an ultrasound of your kidneys.  Perform a procedure to take a sample of kidney tissue for testing (biopsy). How is this treated? The goal of treatment is to prevent or slow down any damage to your kidneys by managing your diabetes. To do this, it is important to control:  Your blood pressure. ? Your target blood pressure may vary depending on your medical conditions, your age, and other factors. ? To help control blood pressure, you may be prescribed medicines to lower your blood pressure (ACE inhibitors) or to help your body get rid of excess fluid (diuretics).  Your A1c (hemoglobin A1c) level. Generally, the goal of  treatment is to maintain an A1c level of less than 7%.  Your blood glucose level.  Your blood lipids. If you have high cholesterol, you may need to take lipid-lowering drugs, such as statins. Other treatments may include:  Medicines, including insulin injections.  Lifestyle changes, such  as: ? Losing weight. ? Quitting smoking (smoking cessation).  Changes to your diet, which may include limiting your salt (sodium), protein, and fluid intake. If your disease progresses to end-stage kidney failure, treatment may include:  Dialysis. This is a procedure to filter your blood with a machine.  Kidney transplant. Follow these instructions at home: Lifestyle  Maintain a healthy weight. Work with your health care provider to lose weight, if needed.  Do not use any products that contain nicotine or tobacco, such as cigarettes and e-cigarettes. If you need help quitting, ask your health care provider.  Be physically active every day. Ask your health care provider what type of exercise is best for you.  Eat healthy foods, and eat healthy snacks between meals. Follow instructions from your health care provider about eating and drinking restrictions.  Limit your sodium, protein, or fluid intake as directed.  Work with your health care provider to manage your blood pressure. General instructions      Follow your diabetes management plan as directed. ? Check your blood glucose levels as directed by your health care provider. ? Keep your blood glucose in your target range as directed by your health care provider. ? Have your A1c level checked two or more times a year, or as often as told by your health care provider.  Measure your blood pressure regularly at home, as told by your health care provider.  Take over-the-counter and prescription medicines only as told by your health care provider. These include insulin and supplements.  Keep all follow-up visits and routine visits as told by your health care provider. This is important. Make sure to get screening tests as directed. Contact a health care provider if:  You have trouble keeping your blood glucose in your goal range.  Your blood glucose level is higher than 240 mg/dL (13.3 mmol/L) for 2 days in a row.  You have  swelling in your hands, ankles, or feet.  You feel weak, tired, or dizzy.  You have muscle tightening that you cannot control (spasms).  You have nausea or vomiting.  You feel tired all the time. Get help right away if:  You are very sleepy.  You faint.  You have: ? A seizure. ? Severe, painful muscle spasms. ? Shortness of breath. ? Chest pain. Summary  Keep your blood glucose in your target range as directed by your health care provider.  Work with your health care provider to manage your blood pressure.  Keep all follow-up visits and routine visits as told by your health care provider. This is important. Make sure to get screening tests as directed. This information is not intended to replace advice given to you by your health care provider. Make sure you discuss any questions you have with your health care provider. Document Released: 04/11/2007 Document Revised: 11/09/2016 Document Reviewed: 02/18/2016 Elsevier Interactive Patient Education  2019 [employer].

## 2018-05-26 NOTE — Telephone Encounter (Signed)
Drug is covered by current benefit plan. No further PA activity needed per insurance. Pharmacy aware.  

## 2018-05-26 NOTE — Progress Notes (Signed)
HPI:                                                                Morgan Duran is a 53 y.o. female who presents to [facility]: [facility] today for medication management  Additional concerns: migraines and menses  DM II:  Admits that she has not been compliant with her medications because she just doesn't want to be a diabetic and she is stuck in a negative mentality about it. She stopped her medication altogether for a time and began experiencing hyperglycemia and polyuria. Denies polydipsia, polyuria, polyphagia. Endorses blurred vision. Endorses burning and numbness in her feet, worse in the evenings.  Denies ulcers/wounds on feet. Hx of DKA/HHS: none Glucometer: OneTouch Blood glucose readings: 500 (2 weeks ago), she had increased urination, re-started her Metformin and Toujeo 40 Units nightly and readings came down to 200+, for the last week her readings have been 130's Hypoglycemia frequency: 1 week ago had sx of dizziness but did not check her glucose. She thinks it was low Severe hypoglycemia (requiring 3rd party assistance): none  HTN: never started Lisinopril. Does not check BP's at home. Denies vision change, headache, chest pain with exertion, orthopnea, lightheadedness, syncope and edema. Risk factors include: DM2, obesity  Migraines: she has been taking Emgality, headaches reduced to 2-3 per month from 2 per week. She is very pleased with this. Headaches are behind the eyes and occipital area. Associated with nausea, dizziness, photophobia, phonophobia, and fatigue. Menses are a trigger for her Reports she "gets a funny feeling" before the headache comes on, but no visual aura She has tried Topamax and Pamelor  Aortic stenosis Mild, overdue for surveillance echo. Asymptomatic. Denies CP, palpitations, dyspnea, change in exercise tolerance, or syncope. She is able to do low-impact seated elliptical, but cannot do other weight bearing  exercise due to her knee OA.  Menses Still having irregular periods. LMP December. Has not had any heavy bleeding.  Depression screen [facility] 2/9 05/26/2018 01/21/2017 01/07/2017  Decreased Interest 0 0 0  Down, Depressed, Hopeless 0 0 0  PHQ - 2 Score 0 0 0     Past Medical History:  Diagnosis Date  . Anxiety   . Arthritis   . Diabetes mellitus without complication ([place])   . Heart murmur   . Hyperlipidemia   . Hypertension   . Migraine   . Morbid obesity ([place])   . OSA on CPAP    Past Surgical History:  Procedure Laterality Date  . KNEE ARTHROSCOPY    . NASAL SEPTUM SURGERY    . SHOULDER ARTHROSCOPY    . TONSILLECTOMY     Social History   Tobacco Use  . Smoking status: Former Research scientist (life sciences)  . Smokeless tobacco: Never Used  Substance Use Topics  . Alcohol use: Yes   family history includes Diabetes in her father; Emphysema in her mother.    ROS: negative except as noted in the HPI  Medications: Current Outpatient Medications  Medication Sig Dispense Refill  . aspirin EC 81 MG tablet Take 1 tablet (81 mg total) by mouth daily. 90 tablet 3  . Dulaglutide (TRULICITY) 9.60 AV/4.0JW SOPN Inject 0.75 mg into the skin once a week. 4 pen 0  . Galcanezumab-gnlm [facility])  120 MG/ML SOAJ Inject 120 mg as directed every 30 (thirty) days. 1 pen 11  . glucose blood (ONE TOUCH ULTRA TEST) test strip USE AS INSTRUCTED 100 each 1  . Insulin Glargine, 2 Unit Dial, (TOUJEO MAX SOLOSTAR) 300 UNIT/ML SOPN Inject 40 Units into the skin at bedtime. 3 pen 2  . metoCLOPramide (REGLAN) 10 MG tablet Take 1 tablet (10 mg total) by mouth every 6 (six) hours as needed for nausea or vomiting. 30 tablet 2  . Multiple Vitamins-Minerals (MULTIVITAMIN WOMEN PO) Take by mouth.    . Naproxen Sodium (ALEVE PO) Take by mouth.    . rizatriptan (MAXALT) 10 MG tablet Take 1 tablet (10 mg total) by mouth as needed for migraine. May repeat in 2 hours if needed 9 tablet 2  . atorvastatin (LIPITOR) 20 MG tablet Take 1  tablet (20 mg total) by mouth at bedtime. (Patient not taking: Reported on 05/26/2018) 90 tablet 1  . metFORMIN (GLUCOPHAGE XR) 500 MG 24 hr tablet Take 4 tablets (2,000 mg total) by mouth daily with lunch. 360 tablet 0   No current facility-administered medications for this visit.    No Known Allergies     Objective:  BP 137/84   Pulse 93   Wt 277 lb (125.6 kg)   LMP 03/16/2018 (Approximate)   BMI 47.55 kg/m  Gen:  alert, not ill-appearing, no distress, appropriate for age, obese female HEENT: head normocephalic without obvious abnormality, conjunctiva and cornea clear, trachea midline Pulm: Normal work of breathing, normal phonation, clear to auscultation bilaterally, no wheezes, rales or rhonchi CV: Normal rate, regular rhythm, s1 and s2 distinct, no murmurs, clicks or rubs  Neuro: alert and oriented x 3, no tremor MSK: extremities atraumatic, normal gait and station, no peripheral edema Skin: intact, no rashes on exposed skin, no jaundice, no cyanosis Psych: well-groomed, cooperative, good eye contact, euthymic mood, affect mood-congruent, speech is articulate, and thought processes clear and goal-directed  Diabetic Foot Exam - Simple   Simple Foot Form Diabetic Foot exam was performed with the following findings:  Yes 05/26/2018 11:53 AM  Visual Inspection No deformities, no ulcerations, no other skin breakdown bilaterally:  Yes Sensation Testing Intact to touch and monofilament testing bilaterally:  Yes Pulse Check Posterior Tibialis and Dorsalis pulse intact bilaterally:  Yes Comments    Lab Results  Component Value Date   CREATININE 0.62 10/04/2016   BUN 14 10/04/2016   NA 135 10/04/2016   K 4.2 10/04/2016   CL 100 10/04/2016   CO2 26 10/04/2016   Lab Results  Component Value Date   CHOL 200 (H) 05/31/2016   HDL 43 (L) 05/31/2016   TRIG 165 (H) 05/31/2016   CHOLHDL 4.7 05/31/2016    The 10-year ASCVD risk score Mikey Bussing DC Jr., et al., 2013) is: 4%   Values  used to calculate the score:     Age: 35 years     Sex: Female     Is Non-Hispanic African American: No     Diabetic: Yes     Tobacco smoker: No     Systolic Blood Pressure: 875 mmHg     Is BP treated: No     HDL Cholesterol: 43 mg/dL     Total Cholesterol: 200 mg/dL   Results for orders placed or performed in visit on 05/26/18 (from the past 72 hour(s))  POCT HgB A1C     Status: Abnormal   Collection Time: 05/26/18 11:47 AM  Result Value Ref Range  Hemoglobin A1C     HbA1c POC (<> result, manual entry)     HbA1c, POC (prediabetic range)     HbA1c, POC (controlled diabetic range) 9.8 (A) 0.0 - 7.0 %  POCT UA - Microalbumin     Status: Abnormal   Collection Time: 05/26/18 11:47 AM  Result Value Ref Range   Microalbumin Ur, POC 80 mg/L   Creatinine, POC 200 mg/dL   Albumin/Creatinine Ratio, Urine, POC 30-300    No results found.    Assessment and Plan: 53 y.o. female with   Uncontrolled type 2 diabetes mellitus with hyperglycemia ([place]) - Plan: Insulin Glargine, 2 Unit Dial, (TOUJEO MAX SOLOSTAR) 300 UNIT/ML SOPN, metFORMIN (GLUCOPHAGE XR) 500 MG 24 hr tablet, glucose blood (ONE TOUCH ULTRA TEST) test strip, Dulaglutide (TRULICITY) 9.16 OM/6.0OK SOPN, POCT HgB A1C, POCT UA - Microalbumin  Diabetic nephropathy associated with type 2 diabetes mellitus ([place]) - Plan: COMPLETE METABOLIC PANEL WITH GFR  Migraine without aura and without status migrainosus, not intractable - Plan: Galcanezumab-gnlm (EMGALITY) 120 MG/ML SOAJ, metoCLOPramide (REGLAN) 10 MG tablet, rizatriptan (MAXALT) 10 MG tablet  Aortic stenosis, mild - Plan: ECHOCARDIOGRAM COMPLETE  Colon cancer screening - Cologuard ordered 05/26/18 - Plan: Cologuard  Diabetic eye exam ([place]) - referral placed to Ophth. 05/25/18 - Plan: Ambulatory referral to Ophthalmology  Breast cancer screening by mammogram - Plan: MM 3D SCREEN BREAST BILATERAL  - Personally reviewed PMH, PSH, PFH, medications, allergies,  HM - Age-appropriate cancer screening: overdue for colon cancer screening, Cologuard ordered; overdue for mammogram, ordered today; overdue for Pap smear, encouraged to schedule - Influenza UTD - Tdap UTD - PHQ2 negative  Uncontolled Type 2 DM, Diabetic Nephropathy Lab Results  Component Value Date   HGBA1C 9.8 (A) 05/26/2018  - poor glycemic control due to medication noncompliance - she never started Trulicity. I explained mechanism and benefits of GLP-1 including weight loss. She was given a coupon for free 1 month supply - POC urine positive for microalbuminuria. Explained this diagnosis and importance of glycemic control. Plan to start her on an ARB. CMP pending - counseled on ADA carb-restricted diet - she is not compliant with her statin or ACE. Re-iterated clinical reasoning for these medications and encouraged her to take them daily  Migraines - doing well with Emgality - continue Maxalt 10 mg prn and Reglan 10 mg prn - limit triptan to no more than 6 days per month - counseled on migraine lifestyle changes - headache diary  HTN Awaiting renal function to start ARB BP goal <130/80  Patient education and anticipatory guidance given Patient agrees with treatment plan Follow-up in 3 months for diabetes or sooner as needed if symptoms worsen or fail to improve  I spent 40 minutes with this patient, greater than 50% was face-to-face time counseling regarding the above diagnoses   Darlyne Russian PA-C

## 2018-05-27 LAB — COMPLETE METABOLIC PANEL WITH GFR
AG Ratio: 1.7 (calc) (ref 1.0–2.5)
ALBUMIN MSPROF: 4.2 g/dL (ref 3.6–5.1)
ALT: 25 U/L (ref 6–29)
AST: 19 U/L (ref 10–35)
Alkaline phosphatase (APISO): 84 U/L (ref 37–153)
BUN: 18 mg/dL (ref 7–25)
CALCIUM: 9.5 mg/dL (ref 8.6–10.4)
CO2: 26 mmol/L (ref 20–32)
CREATININE: 0.69 mg/dL (ref 0.50–1.05)
Chloride: 103 mmol/L (ref 98–110)
GFR, EST AFRICAN AMERICAN: 117 mL/min/{1.73_m2} (ref >=60)
GFR, EST NON AFRICAN AMERICAN: 101 mL/min/{1.73_m2} (ref >=60)
GLOBULIN: 2.5 g/dL (ref 1.9–3.7)
Glucose, Bld: 202 mg/dL — ABNORMAL HIGH (ref 65–99)
Potassium: 3.9 mmol/L (ref 3.5–5.3)
SODIUM: 138 mmol/L (ref 135–146)
TOTAL PROTEIN: 6.7 g/dL (ref 6.1–8.1)
Total Bilirubin: 0.4 mg/dL (ref 0.2–1.2)

## 2018-05-29 DIAGNOSIS — Z0100 Encounter for examination of eyes and vision without abnormal findings: Secondary | ICD-10-CM

## 2018-05-29 DIAGNOSIS — Z1211 Encounter for screening for malignant neoplasm of colon: Secondary | ICD-10-CM | POA: Insufficient documentation

## 2018-05-29 DIAGNOSIS — E119 Type 2 diabetes mellitus without complications: Secondary | ICD-10-CM | POA: Insufficient documentation

## 2018-05-29 MED ORDER — IRBESARTAN 75 MG PO TABS: 75.0000 mg | ORAL_TABLET | Freq: Every day | ORAL | 1 refills | Status: DC

## 2018-05-29 MED ORDER — ATORVASTATIN CALCIUM 20 MG PO TABS: 20.0000 mg | ORAL_TABLET | Freq: Every day | ORAL | 1 refills | Status: DC

## 2018-06-09 ENCOUNTER — Other Ambulatory Visit (HOSPITAL_BASED_OUTPATIENT_CLINIC_OR_DEPARTMENT_OTHER): Payer: BLUE CROSS/BLUE SHIELD

## 2018-06-19 ENCOUNTER — Ambulatory Visit (HOSPITAL_BASED_OUTPATIENT_CLINIC_OR_DEPARTMENT_OTHER)
Admission: RE | Admit: 2018-06-19 | Discharge: 2018-06-19 | Disposition: A | Discharge status: Home / Self Care | Payer: BLUE CROSS/BLUE SHIELD | Source: Ambulatory Visit | Attending: Physician Assistant | Admitting: Physician Assistant

## 2018-06-19 ENCOUNTER — Other Ambulatory Visit: Payer: Self-pay

## 2018-06-19 DIAGNOSIS — I350 Nonrheumatic aortic (valve) stenosis: Secondary | ICD-10-CM | POA: Insufficient documentation

## 2018-06-19 DIAGNOSIS — M1711 Unilateral primary osteoarthritis, right knee: Secondary | ICD-10-CM | POA: Diagnosis not present

## 2018-06-19 DIAGNOSIS — M25561 Pain in right knee: Secondary | ICD-10-CM | POA: Diagnosis not present

## 2018-06-19 NOTE — Progress Notes (Signed)
  Echocardiogram 2D Echocardiogram has been performed.  Cardell Peach 06/19/2018, 2:12 PM

## 2018-08-29 ENCOUNTER — Other Ambulatory Visit: Payer: Self-pay | Admitting: Physician Assistant

## 2018-08-29 DIAGNOSIS — E1165 Type 2 diabetes mellitus with hyperglycemia: Secondary | ICD-10-CM

## 2018-09-21 DIAGNOSIS — M1711 Unilateral primary osteoarthritis, right knee: Secondary | ICD-10-CM | POA: Diagnosis not present

## 2018-09-21 DIAGNOSIS — M25521 Pain in right elbow: Secondary | ICD-10-CM | POA: Diagnosis not present

## 2018-09-21 DIAGNOSIS — M25522 Pain in left elbow: Secondary | ICD-10-CM | POA: Diagnosis not present

## 2018-09-21 DIAGNOSIS — M25561 Pain in right knee: Secondary | ICD-10-CM | POA: Diagnosis not present

## 2018-10-17 ENCOUNTER — Telehealth: Payer: Self-pay | Admitting: Physician Assistant

## 2018-10-17 NOTE — Telephone Encounter (Signed)
She can do physical this week or week of July 20th but she cannot see another provider for a routine physical

## 2018-10-17 NOTE — Telephone Encounter (Signed)
Left voicemail with information below. Let patient know to call us back to schedule an office visit with PCP for physical.

## 2018-10-17 NOTE — Telephone Encounter (Signed)
Patient would like to know if she can do a physical next Tuesday with another provider since PCP will not be in office.

## 2018-10-23 DIAGNOSIS — M25521 Pain in right elbow: Secondary | ICD-10-CM | POA: Diagnosis not present

## 2018-10-23 DIAGNOSIS — M25522 Pain in left elbow: Secondary | ICD-10-CM | POA: Diagnosis not present

## 2018-10-23 DIAGNOSIS — M25561 Pain in right knee: Secondary | ICD-10-CM | POA: Diagnosis not present

## 2018-12-21 ENCOUNTER — Ambulatory Visit (INDEPENDENT_AMBULATORY_CARE_PROVIDER_SITE_OTHER): Payer: BC Managed Care – PPO | Admitting: Physician Assistant

## 2018-12-21 ENCOUNTER — Encounter: Payer: Self-pay | Admitting: Physician Assistant

## 2018-12-21 ENCOUNTER — Other Ambulatory Visit: Payer: Self-pay

## 2018-12-21 ENCOUNTER — Other Ambulatory Visit (HOSPITAL_COMMUNITY)
Admission: RE | Admit: 2018-12-21 | Discharge: 2018-12-21 | Disposition: A | Discharge status: Home / Self Care | Payer: BC Managed Care – PPO | Source: Ambulatory Visit | Attending: Physician Assistant | Admitting: Physician Assistant

## 2018-12-21 VITALS — BP 134/82 | HR 73 | Temp 98.2°F | Wt 240.0 lb

## 2018-12-21 DIAGNOSIS — Z23 Encounter for immunization: Secondary | ICD-10-CM

## 2018-12-21 DIAGNOSIS — G43009 Migraine without aura, not intractable, without status migrainosus: Secondary | ICD-10-CM

## 2018-12-21 DIAGNOSIS — E1169 Type 2 diabetes mellitus with other specified complication: Secondary | ICD-10-CM | POA: Diagnosis not present

## 2018-12-21 DIAGNOSIS — I10 Essential (primary) hypertension: Secondary | ICD-10-CM | POA: Diagnosis not present

## 2018-12-21 DIAGNOSIS — Z6841 Body Mass Index (BMI) 40.0 and over, adult: Secondary | ICD-10-CM

## 2018-12-21 DIAGNOSIS — N921 Excessive and frequent menstruation with irregular cycle: Secondary | ICD-10-CM

## 2018-12-21 DIAGNOSIS — E1165 Type 2 diabetes mellitus with hyperglycemia: Secondary | ICD-10-CM | POA: Diagnosis not present

## 2018-12-21 DIAGNOSIS — Z5320 Procedure and treatment not carried out because of patient's decision for unspecified reasons: Secondary | ICD-10-CM

## 2018-12-21 DIAGNOSIS — Z8739 Personal history of other diseases of the musculoskeletal system and connective tissue: Secondary | ICD-10-CM

## 2018-12-21 DIAGNOSIS — Z0000 Encounter for general adult medical examination without abnormal findings: Secondary | ICD-10-CM | POA: Diagnosis not present

## 2018-12-21 DIAGNOSIS — Z124 Encounter for screening for malignant neoplasm of cervix: Secondary | ICD-10-CM

## 2018-12-21 DIAGNOSIS — E1159 Type 2 diabetes mellitus with other circulatory complications: Secondary | ICD-10-CM | POA: Diagnosis not present

## 2018-12-21 DIAGNOSIS — E785 Hyperlipidemia, unspecified: Secondary | ICD-10-CM

## 2018-12-21 DIAGNOSIS — I152 Hypertension secondary to endocrine disorders: Secondary | ICD-10-CM

## 2018-12-21 DIAGNOSIS — Z1211 Encounter for screening for malignant neoplasm of colon: Secondary | ICD-10-CM

## 2018-12-21 MED ORDER — METOCLOPRAMIDE HCL 10 MG PO TABS: 10.0000 mg | ORAL_TABLET | Freq: Four times a day (QID) | ORAL | 2 refills | Status: DC | PRN

## 2018-12-21 MED ORDER — RIZATRIPTAN BENZOATE 10 MG PO TABS: 10.0000 mg | ORAL_TABLET | ORAL | 2 refills | Status: DC | PRN

## 2018-12-21 MED ORDER — METFORMIN HCL 1000 MG PO TABS: 1000.0000 mg | ORAL_TABLET | Freq: Two times a day (BID) | ORAL | 1 refills | Status: DC

## 2018-12-21 MED ORDER — INSULIN GLARGINE (2 UNIT DIAL) 300 UNIT/ML ~~LOC~~ SOPN: 40.0000 [IU] | PEN_INJECTOR | Freq: Every evening | SUBCUTANEOUS | 1 refills | Status: DC

## 2018-12-21 NOTE — Patient Instructions (Addendum)
Call [phone] to schedule Mammogram   Please contact our office and [employer] if you do not receive your Cologuard kit in the mail   Diabetes Preventive Care: - annual foot exam, self foot exams at least weekly - annual dilated eye exam with an eye doctor - pneumonia vaccine once (booster in 5 years and at age 53) - annual influenza vaccine - twice yearly dental cleanings and yearly exam - blood pressure goal <130/80 - LDL cholesterol goal <70 - A1C goal <7.0 - body mass index (BMI) less than or equal to 29.0 (ideally below 25) - follow-up every 3 months if your A1C is not at goal - follow-up every 6 months if diabetes is well controlled    Preventive Care 7-29 Years Old, Female Preventive care refers to visits with your health care provider and lifestyle choices that can promote health and wellness. This includes:  A yearly physical exam. This may also be called an annual well check.  Regular dental visits and eye exams.  Immunizations.  Screening for certain conditions.  Healthy lifestyle choices, such as eating a healthy diet, getting regular exercise, not using drugs or products that contain nicotine and tobacco, and limiting alcohol use. What can I expect for my preventive care visit? Physical exam Your health care provider will check your:  Height and weight. This may be used to calculate body mass index (BMI), which tells if you are at a healthy weight.  Heart rate and blood pressure.  Skin for abnormal spots. Counseling Your health care provider may ask you questions about your:  Alcohol, tobacco, and drug use.  Emotional well-being.  Home and relationship well-being.  Sexual activity.  Eating habits.  Work and work Statistician.  Method of birth control.  Menstrual cycle.  Pregnancy history. What immunizations do I need?  Influenza (flu) vaccine  This is recommended every year. Tetanus, diphtheria, and pertussis (Tdap) vaccine  You  may need a Td booster every 10 years. Varicella (chickenpox) vaccine  You may need this if you have not been vaccinated. Zoster (shingles) vaccine  You may need this after age 27. Measles, mumps, and rubella (MMR) vaccine  You may need at least one dose of MMR if you were born in 1957 or later. You may also need a second dose. Pneumococcal conjugate (PCV13) vaccine  You may need this if you have certain conditions and were not previously vaccinated. Pneumococcal polysaccharide (PPSV23) vaccine  You may need one or two doses if you smoke cigarettes or if you have certain conditions. Meningococcal conjugate (MenACWY) vaccine  You may need this if you have certain conditions. Hepatitis A vaccine  You may need this if you have certain conditions or if you travel or work in places where you may be exposed to hepatitis A. Hepatitis B vaccine  You may need this if you have certain conditions or if you travel or work in places where you may be exposed to hepatitis B. Haemophilus influenzae type b (Hib) vaccine  You may need this if you have certain conditions. Human papillomavirus (HPV) vaccine  If recommended by your health care provider, you may need three doses over 6 months. You may receive vaccines as individual doses or as more than one vaccine together in one shot (combination vaccines). Talk with your health care provider about the risks and benefits of combination vaccines. What tests do I need? Blood tests  Lipid and cholesterol levels. These may be checked every 5 years, or more frequently if  you are over 60 years old.  Hepatitis C test.  Hepatitis B test. Screening  Lung cancer screening. You may have this screening every year starting at age 48 if you have a 30-pack-year history of smoking and currently smoke or have quit within the past 15 years.  Colorectal cancer screening. All adults should have this screening starting at age 53 and continuing until age 36. Your  health care provider may recommend screening at age 22 if you are at increased risk. You will have tests every 1-10 years, depending on your results and the type of screening test.  Diabetes screening. This is done by checking your blood sugar (glucose) after you have not eaten for a while (fasting). You may have this done every 1-3 years.  Mammogram. This may be done every 1-2 years. Talk with your health care provider about when you should start having regular mammograms. This may depend on whether you have a family history of breast cancer.  BRCA-related cancer screening. This may be done if you have a family history of breast, ovarian, tubal, or peritoneal cancers.  Pelvic exam and Pap test. This may be done every 3 years starting at age 81. Starting at age 2, this may be done every 5 years if you have a Pap test in combination with an HPV test. Other tests  Sexually transmitted disease (STD) testing.  Bone density scan. This is done to screen for osteoporosis. You may have this scan if you are at high risk for osteoporosis. Follow these instructions at home: Eating and drinking  Eat a diet that includes fresh fruits and vegetables, whole grains, lean protein, and low-fat dairy.  Take vitamin and mineral supplements as recommended by your health care provider.  Do not drink alcohol if: ? Your health care provider tells you not to drink. ? You are pregnant, may be pregnant, or are planning to become pregnant.  If you drink alcohol: ? Limit how much you have to 0-1 drink a day. ? Be aware of how much alcohol is in your drink. In the [place] one drink equals one 12 oz bottle of beer (355 mL), one 5 oz glass of wine (148 mL), or one 1 oz glass of hard liquor (44 mL). Lifestyle  Take daily care of your teeth and gums.  Stay active. Exercise for at least 30 minutes on 5 or more days each week.  Do not use any products that contain nicotine or tobacco, such as cigarettes, e-cigarettes,  and chewing tobacco. If you need help quitting, ask your health care provider.  If you are sexually active, practice safe sex. Use a condom or other form of birth control (contraception) in order to prevent pregnancy and STIs (sexually transmitted infections).  If told by your health care provider, take low-dose aspirin daily starting at age 52. What's next?  Visit your health care provider once a year for a well check visit.  Ask your health care provider how often you should have your eyes and teeth checked.  Stay up to date on all vaccines. This information is not intended to replace advice given to you by your health care provider. Make sure you discuss any questions you have with your health care provider. Document Released: 04/18/2015 Document Revised: 12/01/2017 Document Reviewed: 12/01/2017 Elsevier Patient Education  2020 [employer].

## 2018-12-21 NOTE — Progress Notes (Signed)
HPI:                                                                Morgan Duran is a 53 y.o. female who presents to [facility] today for annual physical with Pap smear   Current Concerns include:  Self-discontinued her Atorvastatin and Irbesartan She also never started Trulicity and has continued on insulin glargine 40 units nightly.  No fasting glucose readings to report today.  Also discontinued her migraine preventive Emgality. Reports her migraines have been much better and she does not feel the need for preventive medication.  GYN/Sexual Health  Obstetrics: G2P2  Menstrual status: perimenopausal  LMP: 11/18/18  Menses: irregular  Last pap smear: unkown  History of abnormal pap smears: never  Sexually active: not currently  Current contraception: abstinence  History of STI: never  Health Maintenance Health Maintenance  Topic Date Due  . OPHTHALMOLOGY EXAM  08/07/1976  . PAP SMEAR-Modifier  08/08/1987  . MAMMOGRAM  08/07/2016  . Fecal DNA (Cologuard)  08/07/2016  . FOOT EXAM  05/27/2019  . HEMOGLOBIN A1C  06/20/2019  . URINE MICROALBUMIN  12/21/2019  . TETANUS/TDAP  02/09/2021  . INFLUENZA VACCINE  Completed  . PNEUMOCOCCAL POLYSACCHARIDE VACCINE AGE 37-64 HIGH RISK  Completed    Past Medical History:  Diagnosis Date  . Anxiety   . Arthritis   . Diabetes mellitus without complication ([place])   . Heart murmur   . Hyperlipidemia   . Hypertension   . Migraine   . Morbid obesity ([place])   . OSA on CPAP    Past Surgical History:  Procedure Laterality Date  . KNEE ARTHROSCOPY    . NASAL SEPTUM SURGERY    . SHOULDER ARTHROSCOPY    . TONSILLECTOMY     Social History   Tobacco Use  . Smoking status: Never Smoker  . Smokeless tobacco: Never Used  Substance Use Topics  . Alcohol use: Not Currently   family history includes Diabetes in her father; Emphysema in her mother.  ROS: negative except as noted  in the HPI  Medications: Current Outpatient Medications  Medication Sig Dispense Refill  . glucose blood (ONE TOUCH ULTRA TEST) test strip USE AS INSTRUCTED 100 each 1  . Insulin Glargine, 2 Unit Dial, 300 UNIT/ML SOPN Inject 40 Units into the skin every evening. 4 pen 1  . metoCLOPramide (REGLAN) 10 MG tablet Take 1 tablet (10 mg total) by mouth every 6 (six) hours as needed for nausea or vomiting (migraine). Max 40 mg/day. Do not use for more than 3 consecutive months 30 tablet 2  . Multiple Vitamins-Minerals (MULTIVITAMIN WOMEN PO) Take by mouth.    . Naproxen Sodium (ALEVE PO) Take by mouth.    . rizatriptan (MAXALT) 10 MG tablet Take 1 tablet (10 mg total) by mouth as needed for migraine. May repeat one dose in 2 hours if needed. Max 20 mg/day. Limit use to 3 days per week or less 9 tablet 2  . metFORMIN (GLUCOPHAGE) 1000 MG tablet Take 1 tablet (1,000 mg total) by mouth 2 (two) times daily with a meal. 180 tablet 1   No current facility-administered medications for this visit.    No Known Allergies     Objective:  BP 134/82   Pulse 73   Temp 98.2 F (36.8 C) (Oral)   Wt 240 lb (108.9 kg)   LMP 12/19/2018 (Exact Date)   BMI 41.20 kg/m   Wt Readings from Last 3 Encounters:  12/21/18 240 lb (108.9 kg)  05/26/18 277 lb (125.6 kg)  04/14/18 275 lb (124.7 kg)   Temp Readings from Last 3 Encounters:  12/21/18 98.2 F (36.8 C) (Oral)  04/14/18 97.8 F (36.6 C) (Oral)  03/17/18 98.1 F (36.7 C) (Oral)   BP Readings from Last 3 Encounters:  12/21/18 134/82  05/26/18 137/84  04/14/18 (!) 163/94   Pulse Readings from Last 3 Encounters:  12/21/18 73  05/26/18 93  04/14/18 84    General Appearance:  Alert, cooperative, no distress, appropriate for age, obese female                            Head:  Normocephalic, without obvious abnormality                             Eyes:  PERRL, EOM's intact, conjunctiva and cornea clear,                              Ears:  TM  pearly gray color and semitransparent, external ear canals normal, both ears                            Nose:  Nares symmetrical, mucosa pink                          Throat:  Lips, tongue, and mucosa are moist, pink, and intact; oropharynx clear, uvula midline; good dentition                             Neck:  Supple; symmetrical, trachea midline, no adenopathy; thyroid: no enlargement, symmetric, no tenderness/mass/nodules                             Back:  Symmetrical, no curvature, ROM normal               Chest/Breast: Deferred                           Lungs:  Clear to auscultation bilaterally, respirations unlabored                             Heart:  normal rate & regular rhythm, S1 and S2 normal, no murmurs, rubs, or gallops                     Abdomen:  Soft, non-tender, no mass or organomegaly              Genitourinary:  vulva without rashes or lesions, normal introitus and urethral meatus, vaginal mucosa without erythema, normal discharge, cervix non-friable without lesions,           Musculoskeletal:  Tone and strength strong and symmetrical, all extremities; no joint pain or edema, normal gait and station  Lymphatic:  No adenopathy             Skin/Hair/Nails:  Skin warm, dry and intact, no rashes or abnormal dyspigmentation on limited exam                   Neurologic:  Alert and oriented x3, no cranial nerve deficits,  sensation grossly intact, normal gait and station, no tremor Psych: well-groomed, cooperative, good eye contact, euthymic mood, affect mood-congruent, speech is articulate, and thought processes clear and goal-directed   A chaperone was present for the GU portion of the exam, Izell Avilla, RMA.    No results found for this or any previous visit (from the past 72 hour(s)). No results found.    Assessment and Plan: 53 y.o. female with   .Morgan Duran was seen today for annual exam.  Diagnoses and all orders for this  visit:  Encounter for annual physical exam -     Hemoglobin A1c -     Lipid Panel w/reflex Direct LDL -     CBC -     COMPLETE METABOLIC PANEL WITH GFR  Uncontrolled type 2 diabetes mellitus with hyperglycemia (HCC) -     Hemoglobin A1c -     COMPLETE METABOLIC PANEL WITH GFR -     Insulin Glargine, 2 Unit Dial, 300 UNIT/ML SOPN; Inject 40 Units into the skin every evening. -     metFORMIN (GLUCOPHAGE) 1000 MG tablet; Take 1 tablet (1,000 mg total) by mouth 2 (two) times daily with a meal. -     Urine Microalbumin w/creat. ratio  Hypertension associated with diabetes (HCC) -     COMPLETE METABOLIC PANEL WITH GFR -     TSH + free T4 -     Urine Microalbumin w/creat. ratio  Need for immunization against influenza -     Flu Vaccine QUAD 36+ mos IM  Encounter for Pap smear of cervix with HPV DNA cotesting -     Cytology - PAP  Class 3 severe obesity due to excess calories with serious comorbidity and body mass index (BMI) of 40.0 to 44.9 in adult [facility])  Dyslipidemia associated with type 2 diabetes mellitus (HCC) -     Lipid Panel w/reflex Direct LDL  Metrorrhagia -     CBC -     Fe+TIBC+Fer -     TSH + free T4  Colon cancer screening Comments: Cologuard re-ordered 12/21/18  Statin declined  Migraine without aura and without status migrainosus, not intractable -     metoCLOPramide (REGLAN) 10 MG tablet; Take 1 tablet (10 mg total) by mouth every 6 (six) hours as needed for nausea or vomiting (migraine). Max 40 mg/day. Do not use for more than 3 consecutive months -     rizatriptan (MAXALT) 10 MG tablet; Take 1 tablet (10 mg total) by mouth as needed for migraine. May repeat one dose in 2 hours if needed. Max 20 mg/day. Limit use to 3 days per week or less  History of osteoporosis -     DG Bone Density    - Personally reviewed PMH, PSH, PFH, medications, allergies, HM - Age-appropriate cancer screening: overdue for all screenings; Pap smear collected today, counseled  patient to schedule mammogram and given phone number.  She never completed Cologuard and this was reordered today. - Diabetic eye exam up-to-date per patient, records requested from [facility] [place] [facility] - Influenza given today - Tdap and pneumococcal vaccines up-to-date - PHQ2 negative - Routine fasting labs  pending   Patient education and anticipatory guidance given Patient agrees with treatment plan Follow-up in 6 months for medication management or sooner as needed  Darlyne Russian PA-C

## 2018-12-22 LAB — CBC
HCT: 36.8 % (ref 35.0–45.0)
Hemoglobin: 12.4 g/dL (ref 11.7–15.5)
MCH: 30.3 pg (ref 27.0–33.0)
MCHC: 33.7 g/dL (ref 32.0–36.0)
MCV: 90 fL (ref 80.0–100.0)
MPV: 10 fL (ref 7.5–12.5)
Platelets: 352 10*3/uL (ref 140–400)
RBC: 4.09 10*6/uL (ref 3.80–5.10)
RDW: 13.5 % (ref 11.0–15.0)
WBC: 8 10*3/uL (ref 3.8–10.8)

## 2018-12-22 LAB — HEMOGLOBIN A1C
Hgb A1c MFr Bld: 6.2 % of total Hgb — ABNORMAL HIGH (ref <5.7)
Mean Plasma Glucose: 131 (calc)
eAG (mmol/L): 7.3 (calc)

## 2018-12-22 LAB — LIPID PANEL W/REFLEX DIRECT LDL
Cholesterol: 172 mg/dL (ref <200)
HDL: 68 mg/dL (ref >=50)
LDL Cholesterol (Calc): 90 mg/dL (calc)
Non-HDL Cholesterol (Calc): 104 mg/dL (calc) (ref <130)
Total CHOL/HDL Ratio: 2.5 (calc) (ref <5.0)
Triglycerides: 58 mg/dL (ref <150)

## 2018-12-22 LAB — COMPLETE METABOLIC PANEL WITH GFR
AG Ratio: 1.5 (calc) (ref 1.0–2.5)
ALT: 19 U/L (ref 6–29)
AST: 19 U/L (ref 10–35)
Albumin: 4.1 g/dL (ref 3.6–5.1)
Alkaline phosphatase (APISO): 60 U/L (ref 37–153)
BUN: 14 mg/dL (ref 7–25)
CO2: 29 mmol/L (ref 20–32)
Calcium: 9.4 mg/dL (ref 8.6–10.4)
Chloride: 103 mmol/L (ref 98–110)
Creat: 0.71 mg/dL (ref 0.50–1.05)
GFR, Est African American: 113 mL/min/{1.73_m2} (ref >=60)
GFR, Est Non African American: 98 mL/min/{1.73_m2} (ref >=60)
Globulin: 2.7 g/dL (calc) (ref 1.9–3.7)
Glucose, Bld: 129 mg/dL — ABNORMAL HIGH (ref 65–99)
Potassium: 4.5 mmol/L (ref 3.5–5.3)
Sodium: 139 mmol/L (ref 135–146)
Total Bilirubin: 0.5 mg/dL (ref 0.2–1.2)
Total Protein: 6.8 g/dL (ref 6.1–8.1)

## 2018-12-22 LAB — MICROALBUMIN / CREATININE URINE RATIO
Creatinine, Urine: 194 mg/dL (ref 20–275)
Microalb Creat Ratio: 157 mcg/mg creat — ABNORMAL HIGH (ref <30)
Microalb, Ur: 30.4 mg/dL

## 2018-12-22 LAB — IRON,TIBC AND FERRITIN PANEL
%SAT: 11 % (calc) — ABNORMAL LOW (ref 16–45)
Ferritin: 23 ng/mL (ref 16–232)
Iron: 43 ug/dL — ABNORMAL LOW (ref 45–160)
TIBC: 387 mcg/dL (calc) (ref 250–450)

## 2018-12-22 LAB — TSH+FREE T4: TSH W/REFLEX TO FT4: 1.57 mIU/L

## 2018-12-24 DIAGNOSIS — Z8739 Personal history of other diseases of the musculoskeletal system and connective tissue: Secondary | ICD-10-CM | POA: Insufficient documentation

## 2018-12-24 DIAGNOSIS — Z5320 Procedure and treatment not carried out because of patient's decision for unspecified reasons: Secondary | ICD-10-CM | POA: Insufficient documentation

## 2018-12-25 ENCOUNTER — Telehealth: Payer: Self-pay

## 2018-12-25 DIAGNOSIS — M25561 Pain in right knee: Secondary | ICD-10-CM | POA: Diagnosis not present

## 2018-12-25 DIAGNOSIS — M1711 Unilateral primary osteoarthritis, right knee: Secondary | ICD-10-CM | POA: Diagnosis not present

## 2018-12-25 NOTE — Telephone Encounter (Signed)
Regular toujeo is fine.

## 2018-12-25 NOTE — Telephone Encounter (Signed)
[facility] call to see if the new prescription for Toujeo. They need to know if it is just the Toujeo or the [employer].

## 2018-12-26 ENCOUNTER — Other Ambulatory Visit: Payer: Self-pay | Admitting: Physician Assistant

## 2018-12-26 DIAGNOSIS — E1165 Type 2 diabetes mellitus with hyperglycemia: Secondary | ICD-10-CM

## 2018-12-26 NOTE — Telephone Encounter (Signed)
Advised pharmacy ok to fill Toujeo regular.

## 2019-01-01 LAB — CYTOLOGY - PAP
Adequacy: ABSENT
Diagnosis: NEGATIVE
High risk HPV: NEGATIVE
Molecular Disclaimer: 56
Molecular Disclaimer: NORMAL

## 2019-02-11 ENCOUNTER — Other Ambulatory Visit: Payer: Self-pay

## 2019-02-11 ENCOUNTER — Emergency Department (INDEPENDENT_AMBULATORY_CARE_PROVIDER_SITE_OTHER)
Admission: EM | Admit: 2019-02-11 | Discharge: 2019-02-11 | Disposition: A | Discharge status: Home / Self Care | Payer: BC Managed Care – PPO | Source: Home / Self Care | Attending: Family Medicine | Admitting: Family Medicine

## 2019-02-11 DIAGNOSIS — G43009 Migraine without aura, not intractable, without status migrainosus: Secondary | ICD-10-CM

## 2019-02-11 DIAGNOSIS — R197 Diarrhea, unspecified: Secondary | ICD-10-CM | POA: Diagnosis not present

## 2019-02-11 DIAGNOSIS — R112 Nausea with vomiting, unspecified: Secondary | ICD-10-CM

## 2019-02-11 LAB — POC SARS CORONAVIRUS 2 AG -  ED: SARS Coronavirus 2 Ag: NEGATIVE

## 2019-02-11 MED ORDER — ONDANSETRON 4 MG PO TBDP: ORAL_TABLET | ORAL | 0 refills | Status: DC

## 2019-02-11 MED ORDER — DEXAMETHASONE SODIUM PHOSPHATE 10 MG/ML IJ SOLN
10.0000 mg | Freq: Once | INTRAMUSCULAR | Status: AC
  Administered 2019-02-11: 10 mg via INTRAMUSCULAR

## 2019-02-11 MED ORDER — KETOROLAC TROMETHAMINE 60 MG/2ML IM SOLN
60.0000 mg | Freq: Once | INTRAMUSCULAR | Status: AC
  Administered 2019-02-11: 60 mg via INTRAMUSCULAR

## 2019-02-11 MED ORDER — ONDANSETRON 4 MG PO TBDP
4.0000 mg | ORAL_TABLET | Freq: Once | ORAL | Status: AC
  Administered 2019-02-11: 4 mg via ORAL

## 2019-02-11 NOTE — ED Provider Notes (Signed)
[name]    CSN: [id number] Arrival date & time: 02/11/19  1458      History   Chief Complaint Chief Complaint  Patient presents with  . N/V/D    HPI Morgan Duran is a 53 y.o. female.   Two days ago after work patient developed watery diarrhea after work, now ceased.  However, today she awoke with a migraine headache and nausea/vomiting.  She denies fevers, chills, and sweats, abdominal pain, respiratory symptoms, chest tightness, and changes in taste/smell.  The history is provided by the patient.    Past Medical History:  Diagnosis Date  . Anxiety   . Arthritis   . Diabetes mellitus without complication ([place])   . Heart murmur   . Hyperlipidemia   . Hypertension   . Migraine   . Morbid obesity ([place])   . OSA on CPAP     Patient Active Problem List   Diagnosis Date Noted  . Statin declined 12/24/2018  . History of osteoporosis 12/24/2018  . Colon cancer screening 05/29/2018  . Diabetic eye exam ([place]) 05/29/2018  . Diabetic nephropathy associated with type 2 diabetes mellitus ([place]) 05/26/2018  . Diabetic polyneuropathy associated with type 2 diabetes mellitus ([place]) 05/26/2018  . Noncompliance with treatment plan 10/13/2017  . Perimenopausal menorrhagia 10/13/2017  . Migraine without aura and without status migrainosus, not intractable 09/30/2017  . Refused pneumococcal vaccination 04/03/2017  . Refused influenza vaccine 04/03/2017  . Class 3 severe obesity due to excess calories with serious comorbidity and body mass index (BMI) of 40.0 to 44.9 in adult ([place]) 04/03/2017  . Hypertension associated with diabetes ([place]) 04/03/2017  . DJD (degenerative joint disease) 03/23/2017  . Aortic stenosis, mild 03/23/2017  . Dyslipidemia associated with type 2 diabetes mellitus ([place]) 01/21/2017  . OSA on CPAP 01/15/2017  . Systolic murmur [id number]  . Metrorrhagia 05/31/2016  . Uncontrolled type 2 diabetes mellitus with hyperglycemia ([place]) 05/31/2016     Past Surgical History:  Procedure Laterality Date  . KNEE ARTHROSCOPY    . NASAL SEPTUM SURGERY    . SHOULDER ARTHROSCOPY    . TONSILLECTOMY      OB History    Gravida  2   Para  2   Term      Preterm      AB      Living        SAB      TAB      Ectopic      Multiple      Live Births               Home Medications    Prior to Admission medications   Medication Sig Start Date End Date Taking? Authorizing Provider  glucose blood (ONE TOUCH ULTRA TEST) test strip USE AS INSTRUCTED 05/26/18   Trixie Dredge, PA-C  Insulin Glargine, 2 Unit Dial, 300 UNIT/ML SOPN Inject 40 Units into the skin every evening. 12/21/18   Trixie Dredge, PA-C  metFORMIN (GLUCOPHAGE) 1000 MG tablet Take 1 tablet (1,000 mg total) by mouth 2 (two) times daily with a meal. 12/21/18   Trixie Dredge, PA-C  metoCLOPramide (REGLAN) 10 MG tablet Take 1 tablet (10 mg total) by mouth every 6 (six) hours as needed for nausea or vomiting (migraine). Max 40 mg/day. Do not use for more than 3 consecutive months 12/21/18   Trixie Dredge, PA-C  Multiple Vitamins-Minerals (MULTIVITAMIN WOMEN PO) Take by mouth.    [provider]  Naproxen Sodium (ALEVE PO) Take by mouth.    [provider]  ondansetron (ZOFRAN ODT) 4 MG disintegrating tablet Take one tab by mouth Q6hr prn nausea.  Dissolve under tongue. 02/11/19   Kandra Nicolas, MD  rizatriptan (MAXALT) 10 MG tablet Take 1 tablet (10 mg total) by mouth as needed for migraine. May repeat one dose in 2 hours if needed. Max 20 mg/day. Limit use to 3 days per week or less 12/21/18   Trixie Dredge, PA-C    Family History Family History  Problem Relation Age of Onset  . Emphysema Mother   . Diabetes Father     Social History Social History   Tobacco Use  . Smoking status: Never Smoker  . Smokeless tobacco: Never Used  Substance Use Topics  . Alcohol use: Not Currently   . Drug use: Never     Allergies   Patient has no known allergies.   Review of Systems Review of Systems  Constitutional: Positive for activity change and appetite change. Negative for chills, diaphoresis, fatigue and fever.  HENT: Negative.   Eyes: Negative.   Respiratory: Negative.   Cardiovascular: Negative.   Gastrointestinal: Positive for diarrhea, nausea and vomiting. Negative for abdominal distention, abdominal pain, blood in stool and constipation.  Genitourinary: Negative.   Musculoskeletal: Negative.   Skin: Negative.   Neurological: Positive for headaches. Negative for dizziness, tremors, seizures, syncope, facial asymmetry, speech difficulty, weakness, light-headedness and numbness.     Physical Exam Triage Vital Signs ED Triage Vitals [02/11/19 1518]  Enc Vitals Group     BP (!) 141/90     Pulse Rate 72     Resp 18     Temp (!) 97.4 F (36.3 C)     Temp Source Oral     SpO2 97 %     Weight      Height      Head Circumference      Peak Flow      Pain Score 4     Pain Loc      Pain Edu?      Excl. in [place]?    No data found.  Updated Vital Signs BP (!) 141/90 (BP Location: Right Arm)   Pulse 72   Temp (!) 97.4 F (36.3 C) (Oral)   Resp 18   SpO2 97%   Visual Acuity Right Eye Distance:   Left Eye Distance:   Bilateral Distance:    Right Eye Near:   Left Eye Near:    Bilateral Near:     Physical Exam Nursing notes and Vital Signs reviewed. Appearance:  Patient appears stated age, and in no acute distress Eyes:  Pupils are equal, round, and reactive to light and accomodation. Fundi benign.  No photophobia.  Extraocular movement is intact.  Conjunctivae are not inflamed  Ears:  Canals normal.  Tympanic membranes normal.  Nose:  Normal turbinates.  No sinus tenderness.  Pharynx:  Normal Neck:  Supple. No adenopathy. Lungs:  Clear to auscultation.  Breath sounds are equal.  Moving air well. Heart:  Regular rate and rhythm without murmurs, rubs,  or gallops.  Abdomen:  Nontender without masses or hepatosplenomegaly.  Bowel sounds are present.  No CVA or flank tenderness.  Extremities:  No edema.  Skin:  No rash present.   Neurologic:  Cranial nerves 2 through 12 are normal.  Patellar, achilles, and elbow reflexes are normal.  Cerebellar function is intact (finger-to-nose and rapid alternating hand movement).  Gait and station are normal.   UC Treatments / Results  Labs (all labs ordered are listed, but only abnormal results are displayed) Labs Reviewed - No data to display  EKG   Radiology No results found.  Procedures Procedures (including critical care time)  Medications Ordered in UC Medications  ketorolac (TORADOL) injection 60 mg (has no administration in time range)  dexamethasone (DECADRON) injection 10 mg (has no administration in time range)  ondansetron (ZOFRAN-ODT) disintegrating tablet 4 mg (4 mg Oral Given 02/11/19 1520)    Initial Impression / Assessment and Plan / UC Course  I have reviewed the triage vital signs and the nursing notes.  Pertinent labs & imaging results that were available during my care of the patient were reviewed by me and considered in my medical decision making (see chart for details).    Suspect viral gastroenteritis exacerbated by migraine headache. Administered Zofran ODT 4mg  PO; given Rx for same. Administered Toradol 60mg  IM; Administered Decadron 10mg  IM.  Followup with Family Doctor if not improved in 5 days.   Final Clinical Impressions(s) / UC Diagnoses   Final diagnoses:  Nausea vomiting and diarrhea  Migraine without aura and without status migrainosus, not intractable     Discharge Instructions     Begin clear liquids (Pedialyte if having diarrhea) for 24 hours until improved, then advance to a [employer] (Bananas, Rice, Applesauce, Toast).  Then gradually resume a regular diet when tolerated.  Avoid milk products until well.    If symptoms become significantly  worse during the night or over the weekend, proceed to the local emergency room.     ED Prescriptions    Medication Sig Dispense Auth. Provider   ondansetron (ZOFRAN ODT) 4 MG disintegrating tablet Take one tab by mouth Q6hr prn nausea.  Dissolve under tongue. 12 tablet Kandra Nicolas, MD        Kandra Nicolas, MD 02/16/19 [phone]

## 2019-02-11 NOTE — Discharge Instructions (Addendum)
Begin clear liquids (Pedialyte if having diarrhea) for 24 hours until improved, then advance to a [employer] (Bananas, Rice, Applesauce, Toast).  Then gradually resume a regular diet when tolerated.  Avoid milk products until well.    If symptoms become significantly worse during the night or over the weekend, proceed to the local emergency room.

## 2019-02-11 NOTE — ED Triage Notes (Signed)
Pt c/o N/V/D since Friday. Diarrhea started Friday, has vomited about 6 x today. Can't keep anything down. Also c/o headache.

## 2019-02-14 LAB — NOVEL CORONAVIRUS, NAA: SARS-CoV-2, NAA: NOT DETECTED

## 2019-03-21 ENCOUNTER — Other Ambulatory Visit: Payer: Self-pay

## 2019-03-21 ENCOUNTER — Ambulatory Visit (INDEPENDENT_AMBULATORY_CARE_PROVIDER_SITE_OTHER): Payer: BC Managed Care – PPO

## 2019-03-21 DIAGNOSIS — Z1231 Encounter for screening mammogram for malignant neoplasm of breast: Secondary | ICD-10-CM

## 2019-03-21 DIAGNOSIS — Z8739 Personal history of other diseases of the musculoskeletal system and connective tissue: Secondary | ICD-10-CM

## 2019-03-21 DIAGNOSIS — Z13820 Encounter for screening for osteoporosis: Secondary | ICD-10-CM | POA: Diagnosis not present

## 2019-03-22 ENCOUNTER — Other Ambulatory Visit: Payer: Self-pay | Admitting: Physician Assistant

## 2019-03-22 ENCOUNTER — Ambulatory Visit: Payer: BC Managed Care – PPO

## 2019-03-22 DIAGNOSIS — M25561 Pain in right knee: Secondary | ICD-10-CM | POA: Diagnosis not present

## 2019-03-22 DIAGNOSIS — R928 Other abnormal and inconclusive findings on diagnostic imaging of breast: Secondary | ICD-10-CM

## 2019-03-22 DIAGNOSIS — M25512 Pain in left shoulder: Secondary | ICD-10-CM | POA: Diagnosis not present

## 2019-03-22 DIAGNOSIS — M1711 Unilateral primary osteoarthritis, right knee: Secondary | ICD-10-CM | POA: Diagnosis not present

## 2019-03-22 DIAGNOSIS — M25521 Pain in right elbow: Secondary | ICD-10-CM | POA: Diagnosis not present

## 2019-04-04 ENCOUNTER — Other Ambulatory Visit: Payer: Self-pay

## 2019-04-04 ENCOUNTER — Ambulatory Visit
Admission: RE | Admit: 2019-04-04 | Discharge: 2019-04-04 | Disposition: A | Discharge status: Home / Self Care | Payer: BC Managed Care – PPO | Source: Ambulatory Visit | Attending: Physician Assistant | Admitting: Physician Assistant

## 2019-04-04 DIAGNOSIS — R928 Other abnormal and inconclusive findings on diagnostic imaging of breast: Secondary | ICD-10-CM

## 2019-04-04 DIAGNOSIS — N6323 Unspecified lump in the left breast, lower outer quadrant: Secondary | ICD-10-CM | POA: Diagnosis not present

## 2019-04-04 DIAGNOSIS — N6321 Unspecified lump in the left breast, upper outer quadrant: Secondary | ICD-10-CM | POA: Diagnosis not present

## 2019-04-09 ENCOUNTER — Encounter: Payer: Self-pay | Admitting: Physician Assistant

## 2019-04-09 ENCOUNTER — Encounter: Payer: Self-pay | Admitting: Osteopathic Medicine

## 2019-04-09 DIAGNOSIS — N6002 Solitary cyst of left breast: Secondary | ICD-10-CM

## 2019-04-09 HISTORY — DX: Solitary cyst of left breast: N60.02

## 2019-05-11 DIAGNOSIS — M25522 Pain in left elbow: Secondary | ICD-10-CM | POA: Diagnosis not present

## 2019-05-27 ENCOUNTER — Other Ambulatory Visit: Payer: Self-pay | Admitting: Physician Assistant

## 2019-05-27 DIAGNOSIS — E1165 Type 2 diabetes mellitus with hyperglycemia: Secondary | ICD-10-CM

## 2019-06-20 ENCOUNTER — Encounter: Payer: Self-pay | Admitting: Osteopathic Medicine

## 2019-06-20 ENCOUNTER — Other Ambulatory Visit: Payer: Self-pay

## 2019-06-20 ENCOUNTER — Ambulatory Visit (INDEPENDENT_AMBULATORY_CARE_PROVIDER_SITE_OTHER): Payer: BC Managed Care – PPO | Admitting: Osteopathic Medicine

## 2019-06-20 VITALS — BP 134/85 | HR 76 | Temp 98.1°F | Wt 242.1 lb

## 2019-06-20 DIAGNOSIS — Z5320 Procedure and treatment not carried out because of patient's decision for unspecified reasons: Secondary | ICD-10-CM

## 2019-06-20 DIAGNOSIS — Z794 Long term (current) use of insulin: Secondary | ICD-10-CM

## 2019-06-20 DIAGNOSIS — G43009 Migraine without aura, not intractable, without status migrainosus: Secondary | ICD-10-CM | POA: Diagnosis not present

## 2019-06-20 DIAGNOSIS — I10 Essential (primary) hypertension: Secondary | ICD-10-CM

## 2019-06-20 DIAGNOSIS — E1159 Type 2 diabetes mellitus with other circulatory complications: Secondary | ICD-10-CM | POA: Diagnosis not present

## 2019-06-20 DIAGNOSIS — E1169 Type 2 diabetes mellitus with other specified complication: Secondary | ICD-10-CM

## 2019-06-20 DIAGNOSIS — I152 Hypertension secondary to endocrine disorders: Secondary | ICD-10-CM

## 2019-06-20 DIAGNOSIS — G4733 Obstructive sleep apnea (adult) (pediatric): Secondary | ICD-10-CM

## 2019-06-20 DIAGNOSIS — Z8739 Personal history of other diseases of the musculoskeletal system and connective tissue: Secondary | ICD-10-CM

## 2019-06-20 DIAGNOSIS — R635 Abnormal weight gain: Secondary | ICD-10-CM

## 2019-06-20 DIAGNOSIS — Z9989 Dependence on other enabling machines and devices: Secondary | ICD-10-CM

## 2019-06-20 DIAGNOSIS — Z2821 Immunization not carried out because of patient refusal: Secondary | ICD-10-CM

## 2019-06-20 LAB — POCT GLYCOSYLATED HEMOGLOBIN (HGB A1C): Hemoglobin A1C: 7.1 % — AB (ref 4.0–5.6)

## 2019-06-20 MED ORDER — OZEMPIC (0.25 OR 0.5 MG/DOSE) 2 MG/1.5ML ~~LOC~~ SOPN: 0.5000 mg | PEN_INJECTOR | SUBCUTANEOUS | 1 refills | Status: DC

## 2019-06-20 NOTE — Patient Instructions (Addendum)
1. Hypertension BP okay but above goal 130/80 or less We should consider medication to protect kidney function and to get BP to goal! (see below under Diabetes heading)   2. Migraine Refill medications as needed   3. Sleep Apnea Stable Continue CPAP  4. Type 2 diabetes  A1C last check was 6.2, today is 7.1. Goal is to keep it at 6.5-7.0.  Changes today:  HOLD insulin  START Ozempic at 0.5 mg weekly. If this causes nausea can reduce to 0.25 mg weekly. If tolerating 0.5 mg weekly for a month, can increase to 1 mg weekly. (message me with questions!) Please be aware that standard of care for diabetic patients includes offering the following for ALL diabetics who can tolerate these medicines:  Metformin to help control sugars and slow/prevent progression of diabetes from bad to worse, even if sugars are at goal (A1C 6.5-7.0%).  Blood pressure medicine Lisinopril or similar to help protect kidneys long-term, even if blood pressure is at goal (<130/80).   Statin medications to prevent against heart attack/stroke, even if cholesterol numbers are at goal (LDL/bad cholesterol <70). Your last LDL was 90.   If you have questions about medications, please let me know!  Standard of care for diabetics also includes having the pneumonia vaccine once before age 55, obtaining annual eye exam, frequently self-examining the feet and having a medical foot exam at least once per year.   6. Abnormal weight gain Continue diet and exercise. Dietary changes are generally more effective in terms of weight loss. Medication options for diabetics which will also help A1C: Ozempic (weekly) - see above!

## 2019-06-20 NOTE — Progress Notes (Signed)
Morgan Duran is a 54 y.o. female who presents to  [place] at [facility]  today, 06/20/19, seeking care for the following: . Establish new PCP (previous has left the practice) . DM2, HTN, HLD, Migraine, Rx refills - see below  . Working on weight loss, frustrated w/ lack of progres son keto, intermittent fasting, cardio/weights.      ASSESSMENT & PLAN with other pertinent history/findings:  The primary encounter diagnosis was Hypertension associated with diabetes ([place]). Diagnoses of Migraine without aura and without status migrainosus, not intractable, OSA on CPAP, Type 2 diabetes mellitus with other specified complication, with long-term current use of insulin ([place]), Refused pneumococcal vaccination, Statin declined, History of osteoporosis, and Abnormal weight gain were also pertinent to this visit.   DIABETES SCREENING/PREVENTIVE CARE: Current antihyperglycemic meds:   Metformin 1000 mg bid  Insulin Glargine 40 units daily - not taken past month  A1C   12/21/18: 6.2%  Today 06/20/19  BP goal <130/80: No   BP Readings from Last 3 Encounters:  06/20/19 134/85  02/11/19 (!) 141/90  12/21/18 134/82  LDL goal <70: No  - was 90 o nlipid panel 12/21/18 Eye exam annually: none on file, importance discussed with patient Foot exam: needs Microalbuminuria:needs Metformin: Yes ACE/ARB: No  Antiplatelet if ASCVD Risk >10%: No  Statin: declined Pneumovax: declined    Patient Instructions  1. Hypertension BP okay but above goal 130/80 or less We should consider medication to protect kidney function and to get BP to goal! (see below under Diabetes heading)   2. Migraine Refill medications as needed   3. Sleep Apnea Stable Continue CPAP  4. Type 2 diabetes  A1C last check was 6.2, today is 7.1. Goal is to keep it at 6.5-7.0.  Changes today:  HOLD insulin  START Ozempic at 0.5 mg weekly. If this causes nausea can reduce to  0.25 mg weekly. If tolerating 0.5 mg weekly for a month, can increase to 1 mg weekly. (message me with questions!) Please be aware that standard of care for diabetic patients includes offering the following for ALL diabetics who can tolerate these medicines:  Metformin to help control sugars and slow/prevent progression of diabetes from bad to worse, even if sugars are at goal (A1C 6.5-7.0%).  Blood pressure medicine Lisinopril or similar to help protect kidneys long-term, even if blood pressure is at goal (<130/80).   Statin medications to prevent against heart attack/stroke, even if cholesterol numbers are at goal (LDL/bad cholesterol <70). Your last LDL was 90.   If you have questions about medications, please let me know!  Standard of care for diabetics also includes having the pneumonia vaccine once before age 29, obtaining annual eye exam, frequently self-examining the feet and having a medical foot exam at least once per year.   6. Abnormal weight gain Continue diet and exercise. Dietary changes are generally more effective in terms of weight loss. Medication options for diabetics which will also help A1C: Ozempic (weekly) - see above!    Orders Placed This Encounter  Procedures  . CBC  . COMPLETE METABOLIC PANEL WITH GFR  . Lipid panel  . TSH  . Microalbumin / creatinine urine ratio  . POCT HgB A1C    Meds ordered this encounter  Medications  . Semaglutide,0.25 or 0.5MG /DOS, (OZEMPIC, 0.25 OR 0.5 MG/DOSE,) 2 MG/1.5ML SOPN    Sig: Inject 0.5 mg into the skin once a week.    Dispense:  3 pen  Refill:  1    Wt Readings from Last 3 Encounters:  06/20/19 242 lb 1.9 oz (109.8 kg)  12/21/18 240 lb (108.9 kg)  05/26/18 277 lb (125.6 kg)      Follow-up instructions: Return in about 3 months (around 09/20/2019) for RECHECK A1C & WEIGHT, see [place] sooner if needed .                                         BP 134/85 (BP Location: Left Arm,  Patient Position: Sitting, Cuff Size: Normal)   Pulse 76   Temp 98.1 F (36.7 C) (Oral)   Wt 242 lb 1.9 oz (109.8 kg)   BMI 41.56 kg/m   Current Meds  Medication Sig  . glucose blood (ONE TOUCH ULTRA TEST) test strip USE AS INSTRUCTED  . Insulin Glargine, 2 Unit Dial, 300 UNIT/ML SOPN Inject 40 Units into the skin every evening.  . metFORMIN (GLUCOPHAGE) 1000 MG tablet TAKE 1 TABLET TWICE A DAY WITH A MEAL  . metoCLOPramide (REGLAN) 10 MG tablet Take 1 tablet (10 mg total) by mouth every 6 (six) hours as needed for nausea or vomiting (migraine). Max 40 mg/day. Do not use for more than 3 consecutive months  . Multiple Vitamins-Minerals (MULTIVITAMIN WOMEN PO) Take by mouth.  . rizatriptan (MAXALT) 10 MG tablet Take 1 tablet (10 mg total) by mouth as needed for migraine. May repeat one dose in 2 hours if needed. Max 20 mg/day. Limit use to 3 days per week or less    Results for orders placed or performed in visit on 06/20/19 (from the past 72 hour(s))  POCT HgB A1C     Status: Abnormal   Collection Time: 06/20/19  8:28 AM  Result Value Ref Range   Hemoglobin A1C 7.1 (A) 4.0 - 5.6 %   HbA1c POC (<> result, manual entry)     HbA1c, POC (prediabetic range)     HbA1c, POC (controlled diabetic range)      No results found.  Depression screen [facility] 2/9 12/21/2018 05/26/2018 01/21/2017  Decreased Interest 0 0 0  Down, Depressed, Hopeless 0 0 0  PHQ - 2 Score 0 0 0  Altered sleeping 0 - -  Tired, decreased energy 3 - -  Change in appetite 0 - -  Feeling bad or failure about yourself  0 - -  Trouble concentrating 0 - -  Moving slowly or fidgety/restless 0 - -  Suicidal thoughts 0 - -  PHQ-9 Score 3 - -    No flowsheet data found.    All questions at time of visit were answered - patient instructed to contact office with any additional concerns or updates.  ER/RTC precautions were reviewed with the patient.  Please note: voice recognition software was used to produce this document,  and typos may escape review. Please contact Dr. Sheppard Coil for any needed clarifications.

## 2019-06-21 DIAGNOSIS — M25522 Pain in left elbow: Secondary | ICD-10-CM | POA: Diagnosis not present

## 2019-06-21 LAB — COMPLETE METABOLIC PANEL WITH GFR
AG Ratio: 1.6 (calc) (ref 1.0–2.5)
ALT: 22 U/L (ref 6–29)
AST: 20 U/L (ref 10–35)
Albumin: 4.2 g/dL (ref 3.6–5.1)
Alkaline phosphatase (APISO): 74 U/L (ref 37–153)
BUN: 18 mg/dL (ref 7–25)
CO2: 30 mmol/L (ref 20–32)
Calcium: 10.2 mg/dL (ref 8.6–10.4)
Chloride: 103 mmol/L (ref 98–110)
Creat: 0.8 mg/dL (ref 0.50–1.05)
GFR, Est African American: 98 mL/min/{1.73_m2} (ref >=60)
GFR, Est Non African American: 85 mL/min/{1.73_m2} (ref >=60)
Globulin: 2.7 g/dL (calc) (ref 1.9–3.7)
Glucose, Bld: 152 mg/dL — ABNORMAL HIGH (ref 65–99)
Potassium: 4.3 mmol/L (ref 3.5–5.3)
Sodium: 141 mmol/L (ref 135–146)
Total Bilirubin: 0.4 mg/dL (ref 0.2–1.2)
Total Protein: 6.9 g/dL (ref 6.1–8.1)

## 2019-06-21 LAB — MICROALBUMIN / CREATININE URINE RATIO
Creatinine, Urine: 141 mg/dL (ref 20–275)
Microalb Creat Ratio: 17 mcg/mg creat (ref <30)
Microalb, Ur: 2.4 mg/dL

## 2019-06-21 LAB — CBC
HCT: 40.1 % (ref 35.0–45.0)
Hemoglobin: 13.4 g/dL (ref 11.7–15.5)
MCH: 29.9 pg (ref 27.0–33.0)
MCHC: 33.4 g/dL (ref 32.0–36.0)
MCV: 89.5 fL (ref 80.0–100.0)
MPV: 9.7 fL (ref 7.5–12.5)
Platelets: 310 10*3/uL (ref 140–400)
RBC: 4.48 10*6/uL (ref 3.80–5.10)
RDW: 12.3 % (ref 11.0–15.0)
WBC: 5.8 10*3/uL (ref 3.8–10.8)

## 2019-06-21 LAB — LIPID PANEL
Cholesterol: 179 mg/dL (ref <200)
HDL: 61 mg/dL (ref >=50)
LDL Cholesterol (Calc): 101 mg/dL (calc) — ABNORMAL HIGH
Non-HDL Cholesterol (Calc): 118 mg/dL (calc) (ref <130)
Total CHOL/HDL Ratio: 2.9 (calc) (ref <5.0)
Triglycerides: 79 mg/dL (ref <150)

## 2019-06-21 LAB — TSH: TSH: 1.83 mIU/L

## 2019-07-23 DIAGNOSIS — M25561 Pain in right knee: Secondary | ICD-10-CM | POA: Diagnosis not present

## 2019-07-23 DIAGNOSIS — M25522 Pain in left elbow: Secondary | ICD-10-CM | POA: Diagnosis not present

## 2019-09-21 ENCOUNTER — Encounter: Payer: Self-pay | Admitting: Osteopathic Medicine

## 2019-09-21 ENCOUNTER — Ambulatory Visit (INDEPENDENT_AMBULATORY_CARE_PROVIDER_SITE_OTHER): Payer: BC Managed Care – PPO | Admitting: Osteopathic Medicine

## 2019-09-21 VITALS — BP 115/80 | HR 77 | Temp 97.9°F | Wt 223.0 lb

## 2019-09-21 DIAGNOSIS — E1169 Type 2 diabetes mellitus with other specified complication: Secondary | ICD-10-CM | POA: Diagnosis not present

## 2019-09-21 DIAGNOSIS — R5382 Chronic fatigue, unspecified: Secondary | ICD-10-CM | POA: Diagnosis not present

## 2019-09-21 DIAGNOSIS — Z1211 Encounter for screening for malignant neoplasm of colon: Secondary | ICD-10-CM | POA: Diagnosis not present

## 2019-09-21 LAB — POCT GLYCOSYLATED HEMOGLOBIN (HGB A1C): Hemoglobin A1C: 6 % — AB (ref 4.0–5.6)

## 2019-09-21 MED ORDER — OZEMPIC (1 MG/DOSE) 2 MG/1.5ML ~~LOC~~ SOPN: 1.0000 mg | PEN_INJECTOR | SUBCUTANEOUS | 11 refills | Status: DC

## 2019-09-21 NOTE — Progress Notes (Signed)
Morgan Duran is a 54 y.o. female who presents to  [place] at [facility]  today, 09/21/19, seeking care for the following: . DM2 - numbers loking better!  . Working on weight loss, doing well w/ Ozempic would liek to increase this since it's helping weight and sugars  . Fatige worse over past few months, no CP/SOB, no DOE. Getting some exercise but would like to do more.      ASSESSMENT & PLAN with other pertinent history/findings:  The primary encounter diagnosis was Type 2 diabetes mellitus with other specified complication, without long-term current use of insulin ([place]). Diagnoses of Chronic fatigue and Colon cancer screening were also pertinent to this visit.  Increase Ozempic  Labs w/u fatigue - advised optimize diet/exercise, she's restricting w/ keto diet, advised try regular diabetic diet     DIABETES SCREENING/PREVENTIVE CARE: Current antihyperglycemic meds:   Metformin 1000 mg bid  Semaglutide 0.5 mg weekly --> 1.0 mg weekly increased 09/21/19 A1C   12/21/18: 6.2%  06/20/19: 7.1  Today 09/21/19 6.0 BP goal <130/80: No   BP Readings from Last 3 Encounters:  09/21/19 115/80  06/20/19 134/85  02/11/19 (!) 141/90  LDL goal <70: No  - was 90 o nlipid panel 12/21/18 Eye exam annually: none on file, importance discussed with patient Foot exam: needs Microalbuminuria:needs Metformin: Yes ACE/ARB: No  Antiplatelet if ASCVD Risk >10%: No  Statin: declined Pneumovax: declined     Orders Placed This Encounter  Procedures  . CBC  . COMPLETE METABOLIC PANEL WITH GFR  . TSH  . Ambulatory referral to Gastroenterology  . POCT HgB A1C    Meds ordered this encounter  Medications  . Semaglutide, 1 MG/DOSE, (OZEMPIC, 1 MG/DOSE,) 2 MG/1.5ML SOPN    Sig: Inject 0.75 mLs (1 mg total) into the skin once a week.    Dispense:  5 pen    Refill:  11    DISP QS 90 DAYS W/ 3 REFILLS THANKS    Wt Readings from Last 3  Encounters:  09/21/19 223 lb 0.6 oz (101.2 kg)  06/20/19 242 lb 1.9 oz (109.8 kg)  12/21/18 240 lb (108.9 kg)      Follow-up instructions: Return in about 6 months (around 03/22/2020).                                         BP 115/80 (BP Location: Left Arm, Patient Position: Sitting, Cuff Size: Large)   Pulse 77   Temp 97.9 F (36.6 C) (Oral)   Wt 223 lb 0.6 oz (101.2 kg)   BMI 38.28 kg/m   Current Meds  Medication Sig  . glucose blood (ONE TOUCH ULTRA TEST) test strip USE AS INSTRUCTED  . metFORMIN (GLUCOPHAGE) 1000 MG tablet TAKE 1 TABLET TWICE A DAY WITH A MEAL  . metoCLOPramide (REGLAN) 10 MG tablet Take 1 tablet (10 mg total) by mouth every 6 (six) hours as needed for nausea or vomiting (migraine). Max 40 mg/day. Do not use for more than 3 consecutive months  . Multiple Vitamins-Minerals (MULTIVITAMIN WOMEN PO) Take by mouth.  . rizatriptan (MAXALT) 10 MG tablet Take 1 tablet (10 mg total) by mouth as needed for migraine. May repeat one dose in 2 hours if needed. Max 20 mg/day. Limit use to 3 days per week or less  . [DISCONTINUED] Semaglutide,0.25 or 0.5MG /DOS, (OZEMPIC, 0.25 OR  0.5 MG/DOSE,) 2 MG/1.5ML SOPN Inject 0.5 mg into the skin once a week.    Results for orders placed or performed in visit on 09/21/19 (from the past 72 hour(s))  POCT HgB A1C     Status: Abnormal   Collection Time: 09/21/19  8:37 AM  Result Value Ref Range   Hemoglobin A1C 6.0 (A) 4.0 - 5.6 %   HbA1c POC (<> result, manual entry)     HbA1c, POC (prediabetic range)     HbA1c, POC (controlled diabetic range)      No results found.  Depression screen [facility] 2/9 12/21/2018 05/26/2018 01/21/2017  Decreased Interest 0 0 0  Down, Depressed, Hopeless 0 0 0  PHQ - 2 Score 0 0 0  Altered sleeping 0 - -  Tired, decreased energy 3 - -  Change in appetite 0 - -  Feeling bad or failure about yourself  0 - -  Trouble concentrating 0 - -  Moving slowly or  fidgety/restless 0 - -  Suicidal thoughts 0 - -  PHQ-9 Score 3 - -    No flowsheet data found.    All questions at time of visit were answered - patient instructed to contact office with any additional concerns or updates.  ER/RTC precautions were reviewed with the patient.  Please note: voice recognition software was used to produce this document, and typos may escape review. Please contact Dr. Sheppard Coil for any needed clarifications.

## 2019-09-22 LAB — COMPLETE METABOLIC PANEL WITH GFR
AG Ratio: 1.9 (calc) (ref 1.0–2.5)
ALT: 19 U/L (ref 6–29)
AST: 18 U/L (ref 10–35)
Albumin: 4.4 g/dL (ref 3.6–5.1)
Alkaline phosphatase (APISO): 62 U/L (ref 37–153)
BUN: 20 mg/dL (ref 7–25)
CO2: 29 mmol/L (ref 20–32)
Calcium: 9.7 mg/dL (ref 8.6–10.4)
Chloride: 104 mmol/L (ref 98–110)
Creat: 0.75 mg/dL (ref 0.50–1.05)
GFR, Est African American: 105 mL/min/{1.73_m2} (ref >=60)
GFR, Est Non African American: 91 mL/min/{1.73_m2} (ref >=60)
Globulin: 2.3 g/dL (calc) (ref 1.9–3.7)
Glucose, Bld: 136 mg/dL — ABNORMAL HIGH (ref 65–99)
Potassium: 4.5 mmol/L (ref 3.5–5.3)
Sodium: 139 mmol/L (ref 135–146)
Total Bilirubin: 0.5 mg/dL (ref 0.2–1.2)
Total Protein: 6.7 g/dL (ref 6.1–8.1)

## 2019-09-22 LAB — CBC
HCT: 37.8 % (ref 35.0–45.0)
Hemoglobin: 12.9 g/dL (ref 11.7–15.5)
MCH: 30.6 pg (ref 27.0–33.0)
MCHC: 34.1 g/dL (ref 32.0–36.0)
MCV: 89.8 fL (ref 80.0–100.0)
MPV: 10 fL (ref 7.5–12.5)
Platelets: 335 10*3/uL (ref 140–400)
RBC: 4.21 10*6/uL (ref 3.80–5.10)
RDW: 12.8 % (ref 11.0–15.0)
WBC: 7.2 10*3/uL (ref 3.8–10.8)

## 2019-09-22 LAB — TSH: TSH: 1.71 mIU/L

## 2019-09-28 DIAGNOSIS — M25561 Pain in right knee: Secondary | ICD-10-CM | POA: Diagnosis not present

## 2019-09-28 DIAGNOSIS — M25521 Pain in right elbow: Secondary | ICD-10-CM | POA: Diagnosis not present

## 2019-10-02 ENCOUNTER — Encounter: Payer: Self-pay | Admitting: Gastroenterology

## 2019-10-09 ENCOUNTER — Other Ambulatory Visit: Payer: Self-pay

## 2019-10-09 DIAGNOSIS — G43009 Migraine without aura, not intractable, without status migrainosus: Secondary | ICD-10-CM

## 2019-10-09 MED ORDER — RIZATRIPTAN BENZOATE 10 MG PO TABS: 10.0000 mg | ORAL_TABLET | ORAL | 2 refills | Status: DC | PRN

## 2019-10-09 NOTE — Telephone Encounter (Signed)
Medication refill request, changed pharmacy. Previous Rx written by historical provider. Last ov- 06/20/2019 Last ordered date- 12/21/18

## 2019-10-23 DIAGNOSIS — M25521 Pain in right elbow: Secondary | ICD-10-CM | POA: Diagnosis not present

## 2019-10-23 DIAGNOSIS — M25561 Pain in right knee: Secondary | ICD-10-CM | POA: Diagnosis not present

## 2019-11-01 ENCOUNTER — Encounter: Payer: Self-pay | Admitting: Gastroenterology

## 2019-11-01 ENCOUNTER — Ambulatory Visit (AMBULATORY_SURGERY_CENTER): Payer: Self-pay | Admitting: *Deleted

## 2019-11-01 ENCOUNTER — Other Ambulatory Visit: Payer: Self-pay

## 2019-11-01 VITALS — Ht 63.0 in | Wt 222.6 lb

## 2019-11-01 DIAGNOSIS — Z1211 Encounter for screening for malignant neoplasm of colon: Secondary | ICD-10-CM

## 2019-11-01 NOTE — Progress Notes (Signed)
Patient denies any allergies to egg or soy products. Patient denies complications with anesthesia/sedation.  Patient denies oxygen use at home and denies diet medications. Dx with sleep apnea, uses CPAP nightly. Emmi instructions for colonoscopy explained and sent via my chart and.given to patient. Patient fully vaccinated  - 06/16/19.

## 2019-11-04 HISTORY — PX: COLONOSCOPY: SHX174

## 2019-11-23 ENCOUNTER — Ambulatory Visit (AMBULATORY_SURGERY_CENTER): Payer: BC Managed Care – PPO | Admitting: Gastroenterology

## 2019-11-23 ENCOUNTER — Other Ambulatory Visit: Payer: Self-pay

## 2019-11-23 ENCOUNTER — Encounter: Payer: Self-pay | Admitting: Gastroenterology

## 2019-11-23 VITALS — BP 155/93 | HR 81 | Temp 98.9°F | Resp 15 | Ht 63.0 in | Wt 222.6 lb

## 2019-11-23 DIAGNOSIS — K5730 Diverticulosis of large intestine without perforation or abscess without bleeding: Secondary | ICD-10-CM

## 2019-11-23 DIAGNOSIS — D128 Benign neoplasm of rectum: Secondary | ICD-10-CM | POA: Diagnosis not present

## 2019-11-23 DIAGNOSIS — Z800 Family history of malignant neoplasm of digestive organs: Secondary | ICD-10-CM

## 2019-11-23 DIAGNOSIS — Z1211 Encounter for screening for malignant neoplasm of colon: Secondary | ICD-10-CM

## 2019-11-23 DIAGNOSIS — K641 Second degree hemorrhoids: Secondary | ICD-10-CM

## 2019-11-23 DIAGNOSIS — K621 Rectal polyp: Secondary | ICD-10-CM

## 2019-11-23 LAB — HM COLONOSCOPY

## 2019-11-23 MED ORDER — SODIUM CHLORIDE 0.9 % IV SOLN: 500.0000 mL | Freq: Once | INTRAVENOUS | Status: DC

## 2019-11-23 NOTE — Progress Notes (Signed)
VS taken by C.W. 

## 2019-11-23 NOTE — Progress Notes (Signed)
PT taken to PACU. Monitors in place. VSS. Report given to RN. 

## 2019-11-23 NOTE — Patient Instructions (Signed)
Please read handouts provided. Continue present medications. Await pathology results. Repeat colonoscopy in 5 years. Consider a fiber supplement.     YOU HAD AN ENDOSCOPIC PROCEDURE TODAY AT [facility]:   Refer to the procedure report that was given to you for any specific questions about what was found during the examination.  If the procedure report does not answer your questions, please call your gastroenterologist to clarify.  If you requested that your care partner not be given the details of your procedure findings, then the procedure report has been included in a sealed envelope for you to review at your convenience later.  YOU SHOULD EXPECT: Some feelings of bloating in the abdomen. Passage of more gas than usual.  Walking can help get rid of the air that was put into your GI tract during the procedure and reduce the bloating. If you had a lower endoscopy (such as a colonoscopy or flexible sigmoidoscopy) you may notice spotting of blood in your stool or on the toilet paper. If you underwent a bowel prep for your procedure, you may not have a normal bowel movement for a few days.  Please Note:  You might notice some irritation and congestion in your nose or some drainage.  This is from the oxygen used during your procedure.  There is no need for concern and it should clear up in a day or so.  SYMPTOMS TO REPORT IMMEDIATELY:   Following lower endoscopy (colonoscopy or flexible sigmoidoscopy):  Excessive amounts of blood in the stool  Significant tenderness or worsening of abdominal pains  Swelling of the abdomen that is new, acute  Fever of 100F or higher   For urgent or emergent issues, a gastroenterologist can be reached at any hour by calling [phone]. Do not use MyChart messaging for urgent concerns.    DIET:  We do recommend a small meal at first, but then you may proceed to your regular diet.  Drink plenty of fluids but you should avoid alcoholic  beverages for 24 hours.  ACTIVITY:  You should plan to take it easy for the rest of today and you should NOT DRIVE or use heavy machinery until tomorrow (because of the sedation medicines used during the test).    FOLLOW UP: Our staff will call the number listed on your records 48-72 hours following your procedure to check on you and address any questions or concerns that you may have regarding the information given to you following your procedure. If we do not reach you, we will leave a message.  We will attempt to reach you two times.  During this call, we will ask if you have developed any symptoms of COVID 19. If you develop any symptoms (ie: fever, flu-like symptoms, shortness of breath, cough etc.) before then, please call [phone].  If you test positive for Covid 19 in the 2 weeks post procedure, please call and report this information to [place].    If any biopsies were taken you will be contacted by phone or by letter within the next 1-3 weeks.  Please call us at [phone] if you have not heard about the biopsies in 3 weeks.    SIGNATURES/CONFIDENTIALITY: You and/or your care partner have signed paperwork which will be entered into your electronic medical record.  These signatures attest to the fact that that the information above on your After Visit Summary has been reviewed and is understood.  Full responsibility of the confidentiality of this discharge information lies  with you and/or your care-partner.

## 2019-11-23 NOTE — Op Note (Signed)
[facility] Patient Name: Morgan Duran Procedure Date: 11/23/2019 8:15 AM MRN: [id number] Endoscopist: Gerrit Heck , MD Age: 54 Referring MD:  Date of Birth: 1965-11-01 Gender: Female Account #: [id number] Procedure:                Colonoscopy Indications:              Colon cancer screening in patient at increased                            risk: Family history of 1st-degree relative with                            colon polyps                           Family history notable for sister with large colon                            polyp and grandfather with history of colon cancer                            in his 25's. Medicines:                Monitored Anesthesia Care Procedure:                Pre-Anesthesia Assessment:                           - Prior to the procedure, a History and Physical                            was performed, and patient medications and                            allergies were reviewed. The patient's tolerance of                            previous anesthesia was also reviewed. The risks                            and benefits of the procedure and the sedation                            options and risks were discussed with the patient.                            All questions were answered, and informed consent                            was obtained. Prior Anticoagulants: The patient has                            taken no previous anticoagulant or antiplatelet  agents. ASA Grade Assessment: III - A patient with                            severe systemic disease. After reviewing the risks                            and benefits, the patient was deemed in                            satisfactory condition to undergo the procedure.                           After obtaining informed consent, the colonoscope                            was passed under direct vision. Throughout the                            procedure,  the patient's blood pressure, pulse, and                            oxygen saturations were monitored continuously. The                            Colonoscope was introduced through the anus and                            advanced to the the cecum, identified by                            appendiceal orifice and ileocecal valve. The                            colonoscopy was performed without difficulty. The                            patient tolerated the procedure well. The quality                            of the bowel preparation was excellent. The                            ileocecal valve, appendiceal orifice, and rectum                            were photographed. Scope In: 8:24:37 AM Scope Out: 8:46:30 AM Scope Withdrawal Time: 0 hours 16 minutes 44 seconds  Total Procedure Duration: 0 hours 21 minutes 53 seconds  Findings:                 The perianal and digital rectal examinations were                            normal.  A 5 mm polyp was found in the rectum. The polyp was                            sessile. The polyp was removed with a cold snare.                            Resection and retrieval were complete. Estimated                            blood loss was minimal.                           Non-bleeding internal hemorrhoids were found during                            retroflexion. The hemorrhoids were medium-sized.                           A few small-mouthed diverticula were found in the                            sigmoid colon.                           The exam was otherwise normal throughout the                            remainder of the colon. Complications:            No immediate complications. Estimated Blood Loss:     Estimated blood loss was minimal. Impression:               - One 5 mm polyp in the rectum, removed with a cold                            snare. Resected and retrieved.                           - Non-bleeding  internal hemorrhoids.                           - Diverticulosis in the sigmoid colon. Recommendation:           - Patient has a contact number available for                            emergencies. The signs and symptoms of potential                            delayed complications were discussed with the                            patient. Return to normal activities tomorrow.                            Written discharge instructions  were provided to the                            patient.                           - Resume previous diet.                           - Continue present medications.                           - Await pathology results.                           - Repeat colonoscopy in 5 years for surveillance                            and due to family history.                           - Return to GI office PRN.                           - Use fiber, for example Citrucel, Fibercon, Konsyl                            or Metamucil.                           - Internal hemorrhoids were noted on this study and                            may be amenable to hemorrhoid band ligation. If you                            are interested in further treatment of these                            hemorrhoids with band ligation, please contact my                            clinic to set up an appointment for evaluation and                            treatment. Gerrit Heck, MD 11/23/2019 8:56:02 AM

## 2019-11-23 NOTE — Progress Notes (Signed)
Pt's states no medical or surgical changes since previsit or office visit. 

## 2019-11-23 NOTE — Progress Notes (Signed)
Called to room to assist during endoscopic procedure.  Patient ID and intended procedure confirmed with present staff. Received instructions for my participation in the procedure from the performing physician.  

## 2019-11-27 ENCOUNTER — Telehealth: Payer: Self-pay

## 2019-11-27 ENCOUNTER — Telehealth: Payer: Self-pay | Admitting: *Deleted

## 2019-11-27 NOTE — Telephone Encounter (Signed)
Left message on f/u call 

## 2019-11-27 NOTE — Telephone Encounter (Signed)
LVM

## 2019-11-29 ENCOUNTER — Encounter: Payer: Self-pay | Admitting: Gastroenterology

## 2020-01-08 ENCOUNTER — Encounter (INDEPENDENT_AMBULATORY_CARE_PROVIDER_SITE_OTHER): Payer: BC Managed Care – PPO | Admitting: Osteopathic Medicine

## 2020-01-08 DIAGNOSIS — Z1231 Encounter for screening mammogram for malignant neoplasm of breast: Secondary | ICD-10-CM | POA: Diagnosis not present

## 2020-01-09 NOTE — Telephone Encounter (Signed)
I don't see a referral/order to sign? Can we confirm where she had [facility] and send order, thanks

## 2020-01-10 NOTE — Telephone Encounter (Signed)
10 mins spent by self and staff

## 2020-01-10 NOTE — Telephone Encounter (Signed)
Pt was seen at [facility] in [place]. Mammo referral pended.

## 2020-01-24 DIAGNOSIS — M1711 Unilateral primary osteoarthritis, right knee: Secondary | ICD-10-CM | POA: Diagnosis not present

## 2020-01-24 DIAGNOSIS — M7701 Medial epicondylitis, right elbow: Secondary | ICD-10-CM | POA: Diagnosis not present

## 2020-02-12 ENCOUNTER — Encounter: Payer: Self-pay | Admitting: Osteopathic Medicine

## 2020-02-12 DIAGNOSIS — G4733 Obstructive sleep apnea (adult) (pediatric): Secondary | ICD-10-CM

## 2020-02-12 NOTE — Telephone Encounter (Signed)
Referral pended

## 2020-02-24 NOTE — Progress Notes (Signed)
02/05/20- 63 yoF never smoker for sleep evaluation courtesy of Emeterio Reeve, DO to update CPAP. Medical problem list includes Migraine, HTN, Aortic Stenosis, OSA, DM2/ polyneuropathy, Dyslipidemia, Degenerative Joint Disease, Morbid Obesity, Anxiety,  Initial dx OSA 2007 for snoring and EDS. CPAP has helped.  Married mother of 2 grown children and working as nanny/ housework Current CPAP machine is old- 2012. This year she has been noting more daytime tiredness. Using 2-3 monster/ energy drinks/ day. Wants to get off these.  Knee pain and nocturia wake her 3-4x/ night.  Denies parasomnias.  Hs lost 70 lbs with diet in last few years.  ENT surgery+tonsils as child, septoplasty. 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  Epworth score- 7 CPAP download- Body weight today- 219 lbs Covid vax- J&J Flu vax- had   Prior to Admission medications   Medication Sig Start Date End Date Taking? Authorizing Provider  acetaminophen (TYLENOL) 500 MG tablet Take 1,000 mg by mouth every 6 (six) hours as needed for mild pain, moderate pain or headache.   Yes [provider]  glucose blood (ONE TOUCH ULTRA TEST) test strip USE AS INSTRUCTED 05/26/18  Yes Trixie Dredge, PA-C  meloxicam (MOBIC) 7.5 MG tablet Take 7.5 mg by mouth 2 (two) times daily as needed. 10/20/19  Yes [provider]  metFORMIN (GLUCOPHAGE) 1000 MG tablet TAKE 1 TABLET TWICE A DAY WITH A MEAL 05/28/19  Yes Samuel Bouche, NP  Multiple Vitamins-Minerals (MULTIVITAMIN WOMEN PO) Take by mouth.   Yes [provider]  rizatriptan (MAXALT) 10 MG tablet Take 1 tablet (10 mg total) by mouth as needed for migraine. May repeat one dose in 2 hours if needed. Max 20 mg/day. Limit use to 3 days per week or less 10/09/19  Yes Alexander, Lanelle Bal, DO  Semaglutide, 1 MG/DOSE, (OZEMPIC, 1 MG/DOSE,) 2 MG/1.5ML SOPN Inject 0.75 mLs (1 mg total) into the skin once a week. Patient taking differently: Inject 1 mg into the skin once  a week. Takes on Sunday 09/21/19  Yes Emeterio Reeve, DO  traMADol (ULTRAM) 50 MG tablet Take 50 mg by mouth every 8 (eight) hours as needed. 09/23/19  Yes [provider]   Past Medical History:  Diagnosis Date   Anxiety    Arthritis    Benign cyst of left breast 04/09/2019   Diabetes mellitus without complication (HCC)    type 2   Heart murmur    at birth, no problems   Hyperlipidemia    Hypertension    Migraine    Morbid obesity ([place])    OSA on CPAP    Sleep apnea    uses cpap nightly   Past Surgical History:  Procedure Laterality Date   COLONOSCOPY  2003   [place] - Normal   KNEE ARTHROSCOPY Right    NASAL SEPTUM SURGERY     SHOULDER ARTHROSCOPY Left    TONSILLECTOMY     UPPER GASTROINTESTINAL ENDOSCOPY  2003   in [place] - Normal   Family History  Problem Relation Age of Onset   Emphysema Mother    Diabetes Father    Colon polyps Neg Hx    Rectal cancer Neg Hx    Stomach cancer Neg Hx    Social History   Socioeconomic History   Marital status: Married    Spouse name: Not on file   Number of children: Not on file   Years of education: Not on file   Highest education level: Not on file  Occupational History  Not on file  Tobacco Use   Smoking status: Never Smoker   Smokeless tobacco: Never Used  Vaping Use   Vaping Use: Never used  Substance and Sexual Activity   Alcohol use: Not Currently   Drug use: Never   Sexual activity: Not Currently    Birth control/protection: Abstinence  Other Topics Concern   Not on file  Social History Narrative   Not on file   Social Determinants of Health   Financial Resource Strain:    Difficulty of Paying Living Expenses: Not on file  Food Insecurity:    Worried About [place] in the Last Year: Not on file   Ran Out of Food in the Last Year: Not on file  Transportation Needs:    Lack of Transportation (Medical): Not on file   Lack of Transportation  (Non-Medical): Not on file  Physical Activity:    Days of Exercise per Week: Not on file   Minutes of Exercise per Session: Not on file  Stress:    Feeling of Stress : Not on file  Social Connections:    Frequency of Communication with Friends and Family: Not on file   Frequency of Social Gatherings with Friends and Family: Not on file   Attends Religious Services: Not on file   Active Member of Clubs or Organizations: Not on file   Attends Archivist Meetings: Not on file   Marital Status: Not on file  Intimate Partner Violence:    Fear of Current or Ex-Partner: Not on file   Emotionally Abused: Not on file   Physically Abused: Not on file   Sexually Abused: Not on file   ROS-see HPI  + = positive Constitutional:    weight loss, night sweats, fevers, chills, +fatigue, lassitude. HEENT:   + headaches, difficulty swallowing, tooth/dental problems, sore throat,       sneezing, itching, ear ache, nasal congestion, post nasal drip, snoring CV:    chest pain, orthopnea, PND, swelling in lower extremities, anasarca,                                  dizziness, +palpitations Resp:   shortness of breath with exertion or at rest.                productive cough,   non-productive cough, coughing up of blood.              change in color of mucus.  wheezing.   Skin:    rash or lesions. GI:  No-   heartburn, indigestion, abdominal pain, nausea, vomiting, diarrhea,                 change in bowel habits, loss of appetite GU: dysuria, change in color of urine, no urgency or frequency.   flank pain. MS:   j+oint pain, stiffness, decreased range of motion, back pain. Neuro-     nothing unusual Psych:  change in mood or affect.  depression or anxiety.   memory loss.  OBJ- Physical Exam General- Alert, Oriented, Affect-appropriate, Distress- none acute Skin- rash-none, lesions- none, excoriation- none Lymphadenopathy- none Head- atraumatic            Eyes- Gross vision  intact, PERRLA, conjunctivae and secretions clear            Ears- Hearing, canals-normal            Nose- Clear,  no-Septal dev, mucus, polyps, erosion, perforation             Throat- Mallampati III-IV , mucosa clear , drainage- none, tonsils-absent, + teeth Neck- flexible , trachea midline, no stridor , thyroid nl, carotid no bruit Chest - symmetrical excursion , unlabored           Heart/CV- RRR , no murmur heard , no gallop  , no rub, nl s1 s2                           - JVD- none , edema- none, stasis changes- none, varices- none           Lung- clear to P&A, wheeze- none, cough- none , dullness-none, rub- none           Chest wall-  Abd-  Br/ Gen/ Rectal- Not done, not indicated Extrem- cyanosis- none, clubbing, none, atrophy- none, strength- nl Neuro- grossly intact to observation

## 2020-02-25 ENCOUNTER — Encounter: Payer: Self-pay | Admitting: Internal Medicine

## 2020-02-25 ENCOUNTER — Ambulatory Visit (INDEPENDENT_AMBULATORY_CARE_PROVIDER_SITE_OTHER): Payer: BC Managed Care – PPO | Admitting: Internal Medicine

## 2020-02-25 ENCOUNTER — Other Ambulatory Visit: Payer: Self-pay

## 2020-02-25 VITALS — BP 124/72 | HR 83 | Temp 97.3°F | Ht 65.0 in | Wt 219.0 lb

## 2020-02-25 DIAGNOSIS — Z6841 Body Mass Index (BMI) 40.0 and over, adult: Secondary | ICD-10-CM

## 2020-02-25 DIAGNOSIS — Z9989 Dependence on other enabling machines and devices: Secondary | ICD-10-CM

## 2020-02-25 DIAGNOSIS — G4733 Obstructive sleep apnea (adult) (pediatric): Secondary | ICD-10-CM | POA: Diagnosis not present

## 2020-02-25 NOTE — Assessment & Plan Note (Signed)
She describes supervised weight loss of almost 70 lbs over last 2 years. Still above her goal weight.

## 2020-02-25 NOTE — Patient Instructions (Signed)
Order - schedule home sleep test   Dx OSA  Please call us for results and recommendations about 2 weeks after your sleep test. If appropriate, we may be able to start treatment before we see you next.

## 2020-02-25 NOTE — Assessment & Plan Note (Signed)
Last study 2012. Complains of fatigue despite CPAP, weight loss and energy drinks. We discussed current treatment options and emphasized safe driving Plan- update sleep study.

## 2020-03-19 ENCOUNTER — Telehealth: Payer: Self-pay | Admitting: Internal Medicine

## 2020-03-20 IMAGING — MG DIGITAL SCREENING BILAT W/ TOMO W/ CAD
6 of 10 series · 6 of 30 positions shown · non-contrast
Comparison: None.

CLINICAL DATA: Screening.

EXAM:
DIGITAL SCREENING BILATERAL MAMMOGRAM WITH TOMO AND CAD

[L MLO synth-2D]
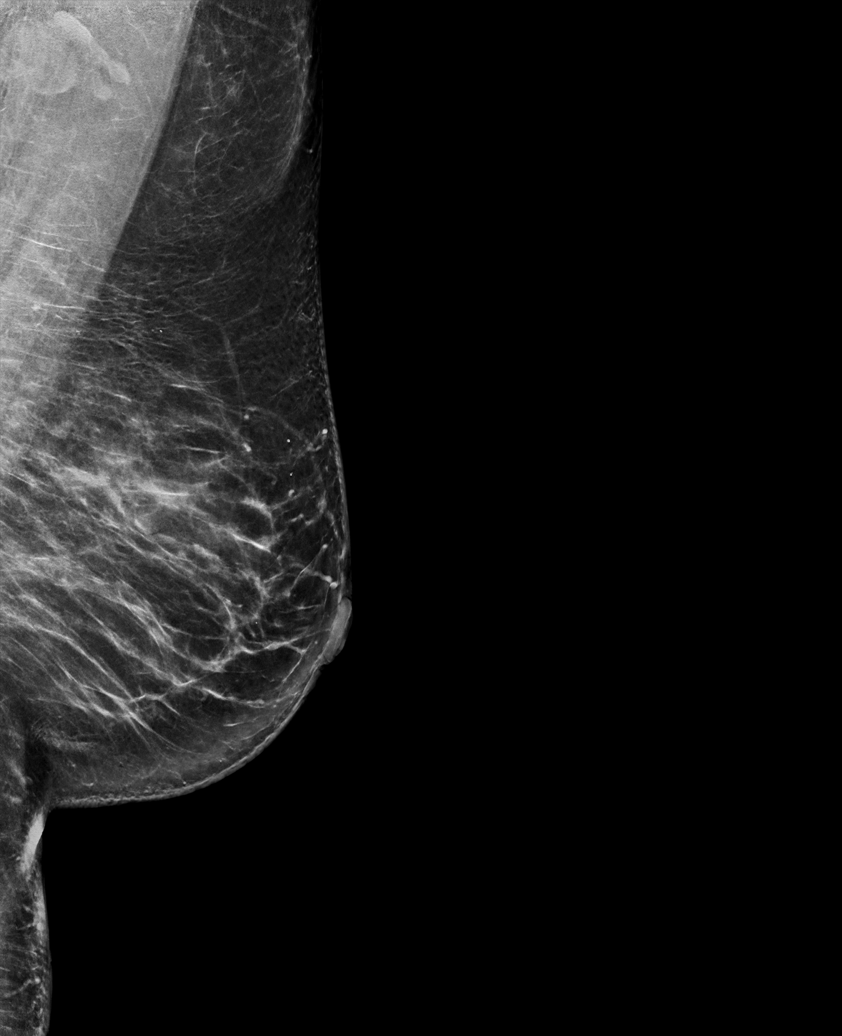

[R XCCL synth-2D]
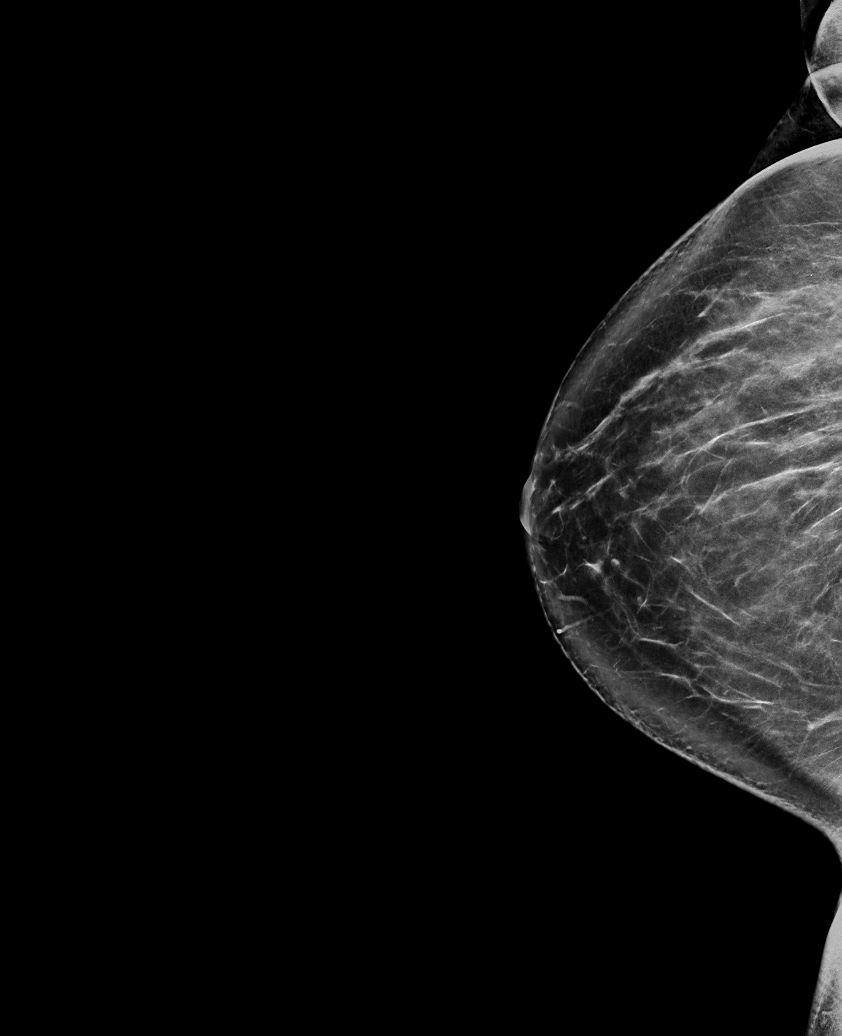

[L CC synth-2D]
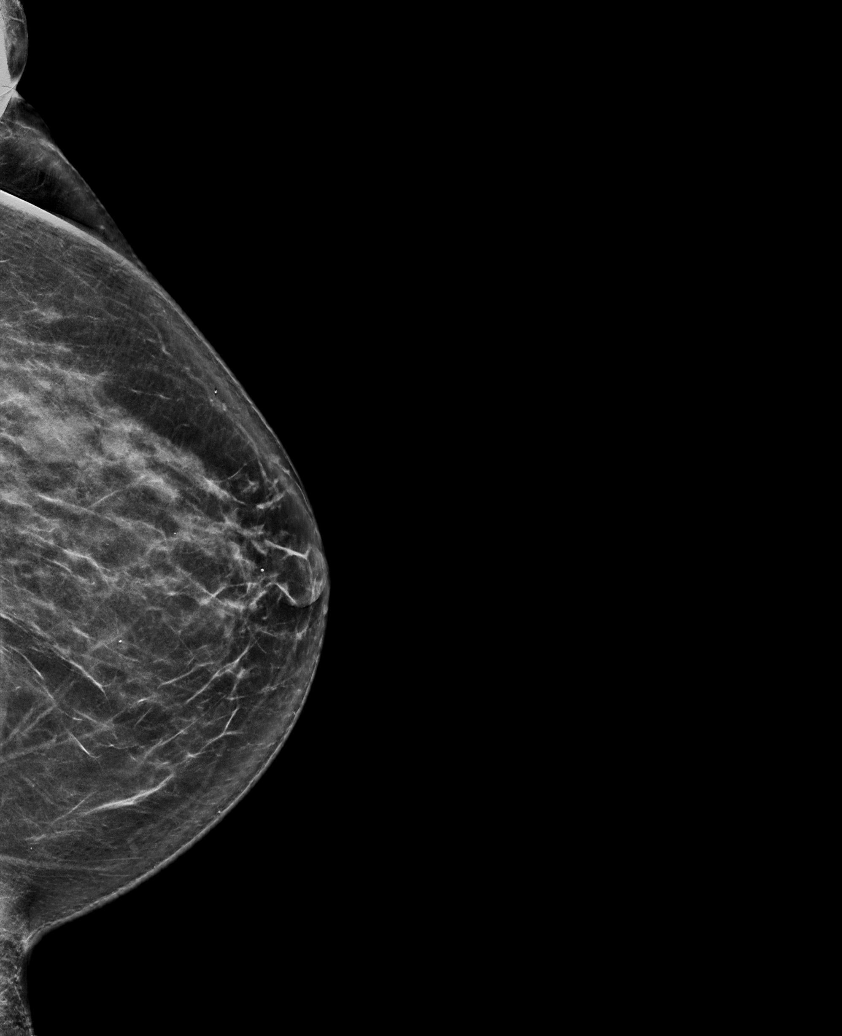

[R MLO synth-2D]
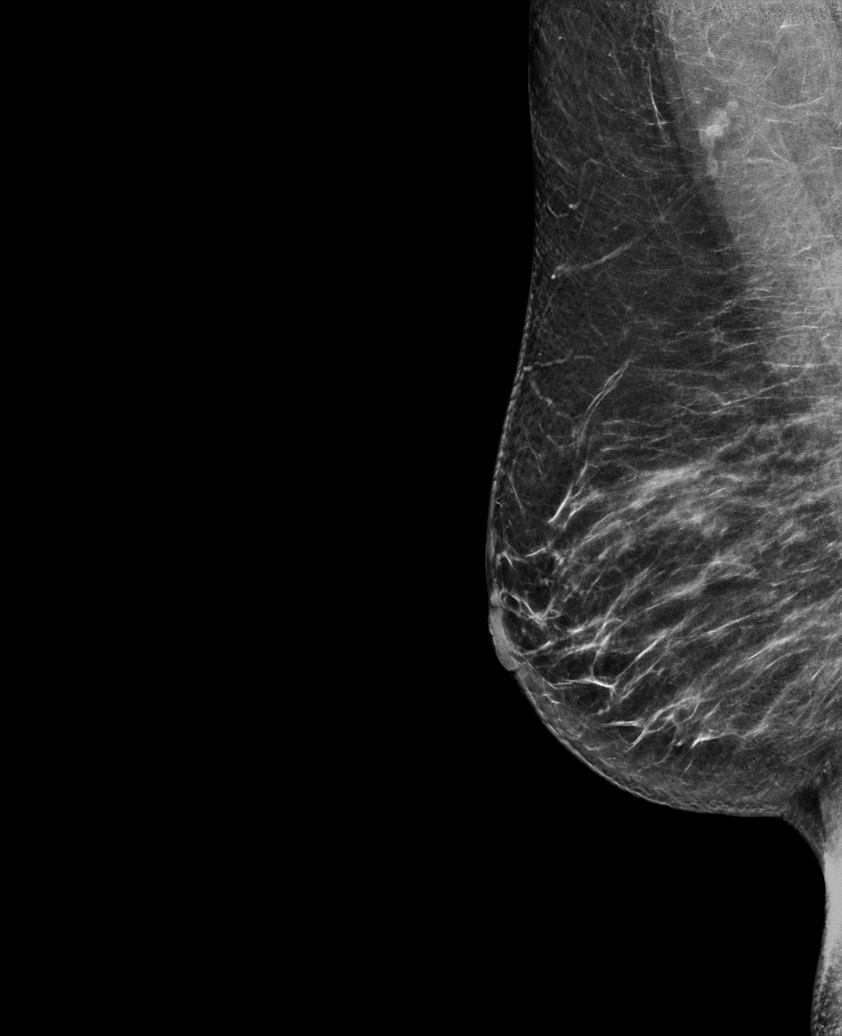

[R CC synth-2D]
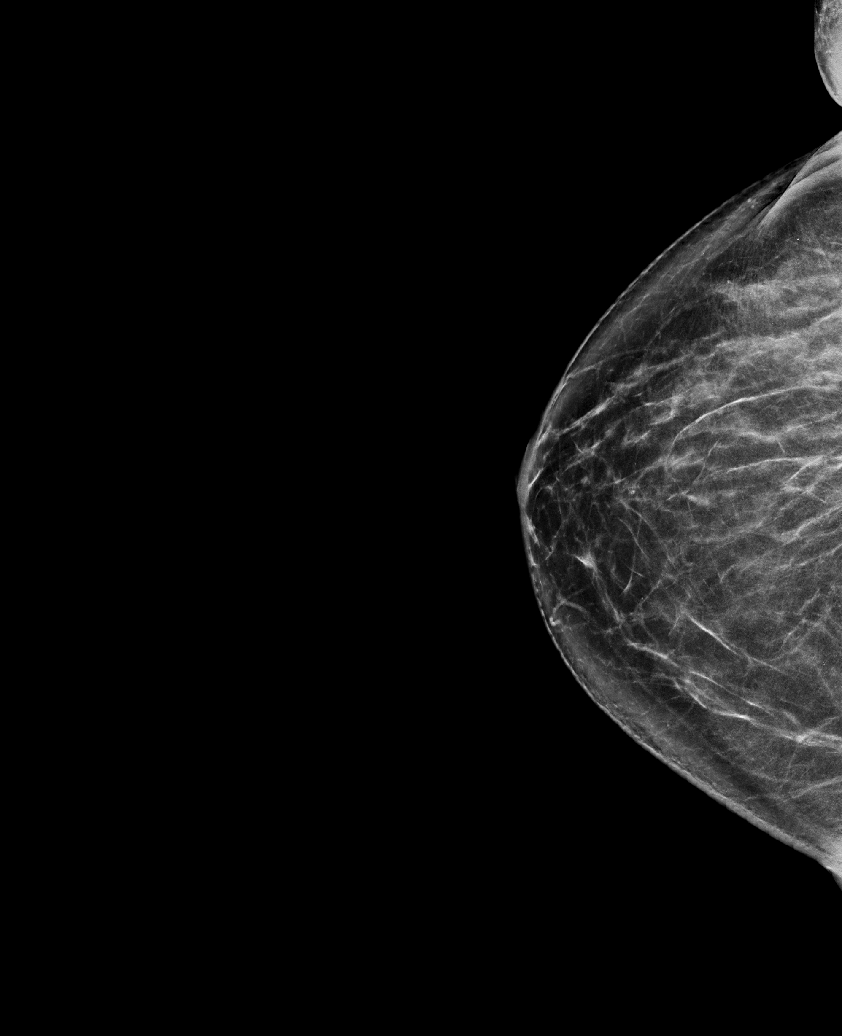

[R XCCL tomo · tomo slice 41/81.0]
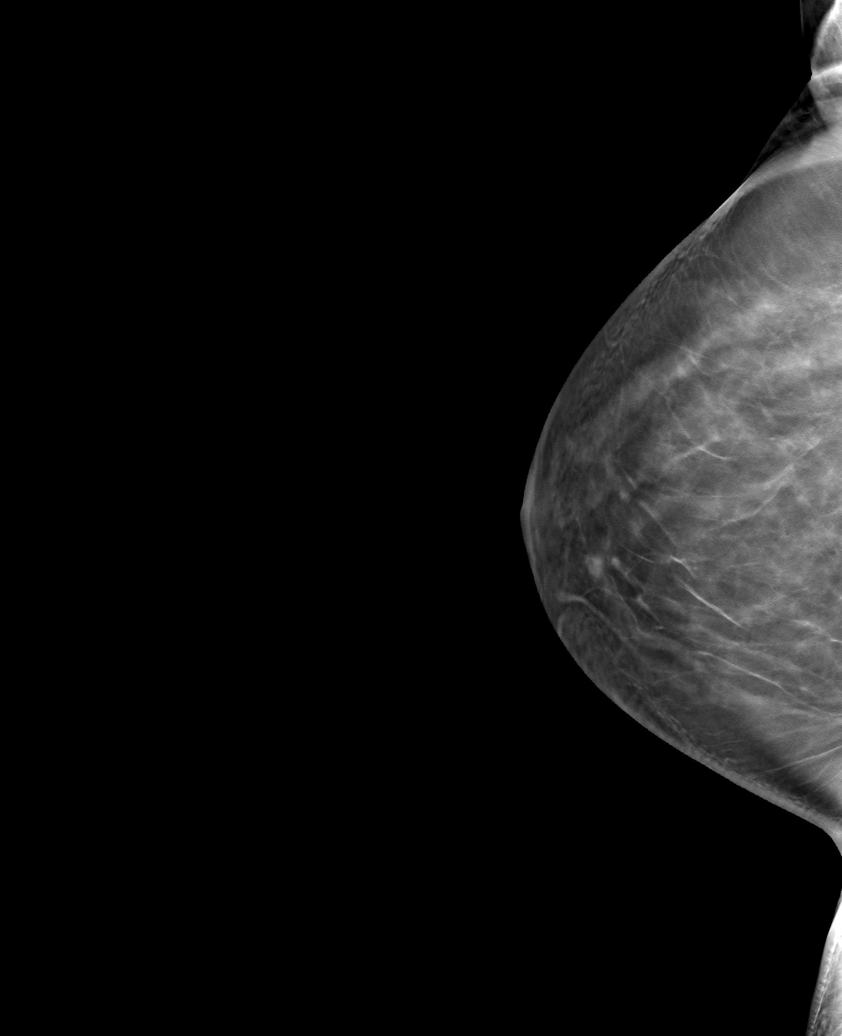

[6 of 30 positions shown; findings below may reference images not displayed]

ACR Breast Density Category c: The breast tissue is heterogeneously
dense, which may obscure small masses.
FINDINGS: In the left breast, a possible mass warrants further evaluation. In
the right breast, no findings suspicious for malignancy.

Images were processed with CAD.
IMPRESSION: Further evaluation is suggested for possible mass in the left
breast.

RECOMMENDATION:
Diagnostic mammogram and possibly ultrasound of the left breast.
(Code:[id number])

The patient will be contacted regarding the findings, and additional
imaging will be scheduled.

BI-RADS CATEGORY  0: Incomplete. Need additional imaging evaluation
and/or prior mammograms for comparison.

## 2020-03-21 NOTE — Telephone Encounter (Signed)
Encounter created in error.  Will close.

## 2020-03-24 ENCOUNTER — Other Ambulatory Visit: Payer: Self-pay

## 2020-03-24 ENCOUNTER — Ambulatory Visit
Admission: RE | Admit: 2020-03-24 | Discharge: 2020-03-24 | Disposition: A | Discharge status: Home / Self Care | Payer: BC Managed Care – PPO | Source: Ambulatory Visit | Attending: Osteopathic Medicine | Admitting: Osteopathic Medicine

## 2020-03-24 ENCOUNTER — Ambulatory Visit: Payer: BC Managed Care – PPO | Admitting: Osteopathic Medicine

## 2020-03-24 DIAGNOSIS — Z1231 Encounter for screening mammogram for malignant neoplasm of breast: Secondary | ICD-10-CM | POA: Diagnosis not present

## 2020-03-25 ENCOUNTER — Ambulatory Visit: Payer: BC Managed Care – PPO

## 2020-03-25 DIAGNOSIS — R0683 Snoring: Secondary | ICD-10-CM | POA: Diagnosis not present

## 2020-03-25 DIAGNOSIS — G4733 Obstructive sleep apnea (adult) (pediatric): Secondary | ICD-10-CM

## 2020-03-25 DIAGNOSIS — M1711 Unilateral primary osteoarthritis, right knee: Secondary | ICD-10-CM | POA: Diagnosis not present

## 2020-03-25 DIAGNOSIS — M778 Other enthesopathies, not elsewhere classified: Secondary | ICD-10-CM | POA: Diagnosis not present

## 2020-03-26 ENCOUNTER — Ambulatory Visit (INDEPENDENT_AMBULATORY_CARE_PROVIDER_SITE_OTHER): Payer: BC Managed Care – PPO | Admitting: Osteopathic Medicine

## 2020-03-26 ENCOUNTER — Encounter: Payer: Self-pay | Admitting: Osteopathic Medicine

## 2020-03-26 ENCOUNTER — Other Ambulatory Visit: Payer: Self-pay

## 2020-03-26 VITALS — BP 111/78 | HR 72 | Temp 98.1°F | Wt 221.0 lb

## 2020-03-26 DIAGNOSIS — G43009 Migraine without aura, not intractable, without status migrainosus: Secondary | ICD-10-CM

## 2020-03-26 DIAGNOSIS — E1169 Type 2 diabetes mellitus with other specified complication: Secondary | ICD-10-CM

## 2020-03-26 DIAGNOSIS — Z5320 Procedure and treatment not carried out because of patient's decision for unspecified reasons: Secondary | ICD-10-CM

## 2020-03-26 DIAGNOSIS — E1165 Type 2 diabetes mellitus with hyperglycemia: Secondary | ICD-10-CM

## 2020-03-26 DIAGNOSIS — Z2821 Immunization not carried out because of patient refusal: Secondary | ICD-10-CM | POA: Diagnosis not present

## 2020-03-26 DIAGNOSIS — M1731 Unilateral post-traumatic osteoarthritis, right knee: Secondary | ICD-10-CM

## 2020-03-26 DIAGNOSIS — E785 Hyperlipidemia, unspecified: Secondary | ICD-10-CM

## 2020-03-26 LAB — POCT GLYCOSYLATED HEMOGLOBIN (HGB A1C): Hemoglobin A1C: 6.3 % — AB (ref 4.0–5.6)

## 2020-03-26 MED ORDER — METFORMIN HCL ER 500 MG PO TB24: 500.0000 mg | ORAL_TABLET | Freq: Every day | ORAL | 3 refills | Status: DC

## 2020-03-26 MED ORDER — OZEMPIC (0.25 OR 0.5 MG/DOSE) 2 MG/1.5ML ~~LOC~~ SOPN: 0.5000 mg | PEN_INJECTOR | SUBCUTANEOUS | 4 refills | Status: DC

## 2020-03-26 MED ORDER — RIZATRIPTAN BENZOATE 10 MG PO TABS: 10.0000 mg | ORAL_TABLET | ORAL | 5 refills | Status: DC | PRN

## 2020-03-26 NOTE — Progress Notes (Signed)
Morgan Duran is a 54 y.o. female who presents to  [facility]  today, 03/26/20, seeking care for the following:  Marland Kitchen Monitor DM2     ASSESSMENT & PLAN with other pertinent findings:  The primary encounter diagnosis was Type 2 diabetes mellitus with other specified complication, without long-term current use of insulin (HCC). Diagnoses of Statin declined, Refused pneumococcal vaccination, Dyslipidemia associated with type 2 diabetes mellitus (HCC), Post-traumatic osteoarthritis of right knee, Uncontrolled type 2 diabetes mellitus with hyperglycemia (HCC), and Migraine without aura and without status migrainosus, not intractable were also pertinent to this visit.   Results for orders placed or performed in visit on 03/26/20 (from the past 24 hour(s))  POCT glycosylated hemoglobin (Hb A1C)     Status: Abnormal   Collection Time: 03/26/20  8:31 AM  Result Value Ref Range   Hemoglobin A1C 6.3 (A) 4.0 - 5.6 %   HbA1c POC (<> result, manual entry)     HbA1c, POC (prediabetic range)     HbA1c, POC (controlled diabetic range)    CBC     Status: None   Collection Time: 03/26/20  8:59 AM  Result Value Ref Range   WBC 8.0 3.8 - 10.8 Thousand/uL   RBC 4.31 3.80 - 5.10 Million/uL   Hemoglobin 13.2 11.7 - 15.5 g/dL   HCT 33.3 54.5 - 62.5 %   MCV 89.8 80.0 - 100.0 fL   MCH 30.6 27.0 - 33.0 pg   MCHC 34.1 32.0 - 36.0 g/dL   RDW 63.8 93.7 - 34.2 %   Platelets 302 140 - 400 Thousand/uL   MPV 10.0 7.5 - 12.5 fL    DIABETES SCREENING/PREVENTIVE CARE: Current antihyperglycemic meds:   Metformin 1000 mg bid  Semaglutide 1.0 mg weekly as of 09/21/19 A1C   12/21/18: 6.2%  06/20/19: 7.1  09/21/19 6.0  Today 03/26/20: 6.3  BP goal <130/80:  BP Readings from Last 3 Encounters:  03/26/20 111/78  02/25/20 124/72  11/23/19 (!) 155/93  LDL goal <70: No  - was 101 on lipid panel 06/2019 Eye exam annually: none on file, importance  discussed with patient Foot exam: needs Microalbuminuria:needs Metformin: Yes ACE/ARB: No  Antiplatelet if ASCVD Risk >10%: No  Statin: declined Pneumovax: declined    Patient Instructions  Sugars/Diabetes: A1C today is 6.3, which is up a bit from 6.0 in June but better than 7.1 in March. Lels of 6.3 indicates great control! Continue Metformin and Ozempic. I've changed the Metformin from twice daily to once daily extended release. I've also changed the Ozempic to 0.5 mg weekly. If you need to go down to 0.25 mg weekly for a month then up to 0.5 mg that's ok.   Migraines: refilled as-needed medications. Let me know if migraines worse / more frequent and if you'd like to discuss options for prevention   Tramadol: this is a controlled substance and we are not managing this medication, please reach out to your orthopedic team.   Labs due to repeat - will get these today!        Orders Placed This Encounter  Procedures  . CBC  . COMPLETE METABOLIC PANEL WITH GFR  . Lipid panel  . Microalbumin / creatinine urine ratio  . POCT glycosylated hemoglobin (Hb A1C)    Meds ordered this encounter  Medications  . metFORMIN (GLUCOPHAGE XR) 500 MG 24 hr tablet    Sig: Take 1 tablet (500 mg total) by mouth daily with  breakfast.    Dispense:  90 tablet    Refill:  3  . Semaglutide,0.25 or 0.5MG /DOS, (OZEMPIC, 0.25 OR 0.5 MG/DOSE,) 2 MG/1.5ML SOPN    Sig: Inject 0.5 mg into the skin once a week.    Dispense:  6 mL    Refill:  4  . rizatriptan (MAXALT) 10 MG tablet    Sig: Take 1 tablet (10 mg total) by mouth as needed for migraine. May repeat one dose in 2 hours if needed. Max 20 mg/day. Limit use to 2 days per week or less    Dispense:  18 tablet    Refill:  5       Follow-up instructions: Return in about 6 months (around 09/24/2020) for ANNUAL CHECK-UP - SEE [place] SOONER IF NEEDED..                                         BP 111/78   Pulse 72    Temp 98.1 F (36.7 C)   Wt 221 lb (100.2 kg)   SpO2 98%   BMI 36.78 kg/m   Current Meds  Medication Sig  . acetaminophen (TYLENOL) 500 MG tablet Take 1,000 mg by mouth every 6 (six) hours as needed for mild pain, moderate pain or headache.  . glucose blood (ONE TOUCH ULTRA TEST) test strip USE AS INSTRUCTED  . meloxicam (MOBIC) 7.5 MG tablet Take 7.5 mg by mouth 2 (two) times daily as needed.  . Multiple Vitamins-Minerals (MULTIVITAMIN WOMEN PO) Take by mouth.  . traMADol (ULTRAM) 50 MG tablet Take 50 mg by mouth every 8 (eight) hours as needed.  . [DISCONTINUED] metFORMIN (GLUCOPHAGE) 1000 MG tablet TAKE 1 TABLET TWICE A DAY WITH A MEAL  . [DISCONTINUED] rizatriptan (MAXALT) 10 MG tablet Take 1 tablet (10 mg total) by mouth as needed for migraine. May repeat one dose in 2 hours if needed. Max 20 mg/day. Limit use to 3 days per week or less  . [DISCONTINUED] Semaglutide, 1 MG/DOSE, (OZEMPIC, 1 MG/DOSE,) 2 MG/1.5ML SOPN Inject 0.75 mLs (1 mg total) into the skin once a week. (Patient taking differently: Inject 1 mg into the skin once a week. Takes on Sunday)    Results for orders placed or performed in visit on 03/26/20 (from the past 72 hour(s))  POCT glycosylated hemoglobin (Hb A1C)     Status: Abnormal   Collection Time: 03/26/20  8:31 AM  Result Value Ref Range   Hemoglobin A1C 6.3 (A) 4.0 - 5.6 %   HbA1c POC (<> result, manual entry)     HbA1c, POC (prediabetic range)     HbA1c, POC (controlled diabetic range)    CBC     Status: None   Collection Time: 03/26/20  8:59 AM  Result Value Ref Range   WBC 8.0 3.8 - 10.8 Thousand/uL   RBC 4.31 3.80 - 5.10 Million/uL   Hemoglobin 13.2 11.7 - 15.5 g/dL   HCT 38.7 35.0 - 45.0 %   MCV 89.8 80.0 - 100.0 fL   MCH 30.6 27.0 - 33.0 pg   MCHC 34.1 32.0 - 36.0 g/dL   RDW 11.9 11.0 - 15.0 %   Platelets 302 140 - 400 Thousand/uL   MPV 10.0 7.5 - 12.5 fL    MM 3D SCREEN BREAST BILATERAL  Result Date: 03/26/2020 CLINICAL DATA:   Screening. EXAM: DIGITAL SCREENING BILATERAL MAMMOGRAM WITH TOMO AND CAD COMPARISON:  Previous exam(s). ACR Breast Density Category b: There are scattered areas of fibroglandular density. FINDINGS: There are no findings suspicious for malignancy. Images were processed with CAD. IMPRESSION: No mammographic evidence of malignancy. A result letter of this screening mammogram will be mailed directly to the patient. RECOMMENDATION: Screening mammogram in one year. (Code:[id number]-B-01Y) BI-RADS CATEGORY  1: Negative. Electronically Signed   By: Franki Cabot M.D.   On: 03/26/2020 13:49       All questions at time of visit were answered - patient instructed to contact office with any additional concerns or updates.  ER/RTC precautions were reviewed with the patient as applicable.   Please note: voice recognition software was used to produce this document, and typos may escape review. Please contact Dr. Sheppard Coil for any needed clarifications.

## 2020-03-26 NOTE — Patient Instructions (Addendum)
Sugars/Diabetes: A1C today is 6.3, which is up a bit from 6.0 in June but better than 7.1 in March. Lels of 6.3 indicates great control! Continue Metformin and Ozempic. I've changed the Metformin from twice daily to once daily extended release. I've also changed the Ozempic to 0.5 mg weekly. If you need to go down to 0.25 mg weekly for a month then up to 0.5 mg that's ok.   Migraines: refilled as-needed medications. Let me know if migraines worse / more frequent and if you'd like to discuss options for prevention   Tramadol: this is a controlled substance and we are not managing this medication, please reach out to your orthopedic team.   Labs due to repeat - will get these today!

## 2020-03-27 LAB — COMPLETE METABOLIC PANEL WITH GFR
AG Ratio: 1.6 (calc) (ref 1.0–2.5)
ALT: 25 U/L (ref 6–29)
AST: 20 U/L (ref 10–35)
Albumin: 4.2 g/dL (ref 3.6–5.1)
Alkaline phosphatase (APISO): 71 U/L (ref 37–153)
BUN: 23 mg/dL (ref 7–25)
CO2: 28 mmol/L (ref 20–32)
Calcium: 9.6 mg/dL (ref 8.6–10.4)
Chloride: 104 mmol/L (ref 98–110)
Creat: 0.71 mg/dL (ref 0.50–1.05)
GFR, Est African American: 113 mL/min/{1.73_m2} (ref >=60)
GFR, Est Non African American: 97 mL/min/{1.73_m2} (ref >=60)
Globulin: 2.7 g/dL (calc) (ref 1.9–3.7)
Glucose, Bld: 114 mg/dL — ABNORMAL HIGH (ref 65–99)
Potassium: 4.3 mmol/L (ref 3.5–5.3)
Sodium: 139 mmol/L (ref 135–146)
Total Bilirubin: 0.4 mg/dL (ref 0.2–1.2)
Total Protein: 6.9 g/dL (ref 6.1–8.1)

## 2020-03-27 LAB — CBC
HCT: 38.7 % (ref 35.0–45.0)
Hemoglobin: 13.2 g/dL (ref 11.7–15.5)
MCH: 30.6 pg (ref 27.0–33.0)
MCHC: 34.1 g/dL (ref 32.0–36.0)
MCV: 89.8 fL (ref 80.0–100.0)
MPV: 10 fL (ref 7.5–12.5)
Platelets: 302 10*3/uL (ref 140–400)
RBC: 4.31 10*6/uL (ref 3.80–5.10)
RDW: 11.9 % (ref 11.0–15.0)
WBC: 8 10*3/uL (ref 3.8–10.8)

## 2020-03-27 LAB — LIPID PANEL
Cholesterol: 154 mg/dL (ref <200)
HDL: 59 mg/dL (ref >=50)
LDL Cholesterol (Calc): 81 mg/dL (calc)
Non-HDL Cholesterol (Calc): 95 mg/dL (calc) (ref <130)
Total CHOL/HDL Ratio: 2.6 (calc) (ref <5.0)
Triglycerides: 59 mg/dL (ref <150)

## 2020-03-27 LAB — MICROALBUMIN / CREATININE URINE RATIO
Creatinine, Urine: 122 mg/dL (ref 20–275)
Microalb Creat Ratio: 16 mcg/mg creat (ref <30)
Microalb, Ur: 1.9 mg/dL

## 2020-04-02 DIAGNOSIS — R0683 Snoring: Secondary | ICD-10-CM | POA: Diagnosis not present

## 2020-04-08 ENCOUNTER — Telehealth: Payer: Self-pay | Admitting: Internal Medicine

## 2020-04-08 DIAGNOSIS — G4733 Obstructive sleep apnea (adult) (pediatric): Secondary | ICD-10-CM

## 2020-04-08 NOTE — Telephone Encounter (Signed)
Recent home sleep test was within normal limitss with AHI 4 apneas/ hour. Need at least 5/ hr to qualify for CPAP. Improvement since last study likely reflects weight loss.  Options- continue weight loss program, consider ordering an overnight sleep study at sleep center, since home test may be less accurate, or wait and discuss status as scheduled in February.

## 2020-04-08 NOTE — Telephone Encounter (Signed)
See other encounter dated 04/08/20

## 2020-04-08 NOTE — Telephone Encounter (Signed)
Duplicate encounter regarding this issue was opened to report that patient called back.   Pt is requesting that we order a new cpap for her based on 12/21 sleep study to  DME: [facility].    CY please advise on cpap settings.  Thanks!

## 2020-04-08 NOTE — Telephone Encounter (Signed)
lmtcb for pt.  

## 2020-04-09 ENCOUNTER — Encounter: Payer: Self-pay | Admitting: Osteopathic Medicine

## 2020-04-09 DIAGNOSIS — G4733 Obstructive sleep apnea (adult) (pediatric): Secondary | ICD-10-CM

## 2020-04-09 NOTE — Telephone Encounter (Signed)
Please advise on patient mychart message   Hi Dr. Maple Hudson, Requesting Authorization for new Cpap machine with Insurance,  I use [facility] office. They would need the airflow measurement. I did see the Sleep Study info. I did not understand it all other that it does show I have Sleep Apnea. Thank you, Marin Shutter

## 2020-04-09 NOTE — Telephone Encounter (Signed)
Multiple encounters open for this patient about the same thing as well as a Clinical cytogeneticist message. I have responded to the patient through her mychart message with the following from Dr. Maple Hudson. Nothing further needed.  Jetty Duhamel D, MD to Me     04/08/20 8:37 PM Note Recent home sleep test was within normal limitss with AHI 4 apneas/ hour. Need at least 5/ hr to qualify for CPAP. Improvement since last study likely reflects weight loss.  Options- continue weight loss program, consider ordering an overnight sleep study at sleep center, since home test may be less accurate, or wait and discuss status as scheduled in February.

## 2020-04-09 NOTE — Telephone Encounter (Signed)
Dr. Maple Hudson please advise on message from patient  [facility], Yes, Please for Dr. Maple Hudson to Authorize [facility] in lab sleep study. I am hoping my Insurance will allow it. Thank you very Much.

## 2020-04-09 NOTE — Telephone Encounter (Signed)
Order- split [facility]).  Dx OSA (Recent home sleep test inconsistent with clinical expectations.)

## 2020-04-10 NOTE — Telephone Encounter (Signed)
Split night has been ordered. Nothing further needed at this time.

## 2020-04-10 NOTE — Addendum Note (Signed)
Addended by: Benjie Karvonen R on: 04/10/2020 08:53 AM   Modules accepted: Orders

## 2020-04-10 NOTE — Telephone Encounter (Signed)
Alexander patient Sleep study pended for [facility] Please sign if appropriate

## 2020-04-11 NOTE — Telephone Encounter (Signed)
Order sent.

## 2020-05-04 ENCOUNTER — Other Ambulatory Visit (HOSPITAL_COMMUNITY)
Admission: RE | Admit: 2020-05-04 | Discharge: 2020-05-04 | Disposition: A | Discharge status: Home / Self Care | Payer: BC Managed Care – PPO | Source: Ambulatory Visit | Attending: Family Medicine | Admitting: Family Medicine

## 2020-05-04 ENCOUNTER — Other Ambulatory Visit: Payer: Self-pay

## 2020-05-04 ENCOUNTER — Emergency Department (INDEPENDENT_AMBULATORY_CARE_PROVIDER_SITE_OTHER)
Admission: EM | Admit: 2020-05-04 | Discharge: 2020-05-04 | Disposition: A | Discharge status: Home / Self Care | Payer: BC Managed Care – PPO | Source: Home / Self Care | Attending: Family Medicine | Admitting: Family Medicine

## 2020-05-04 DIAGNOSIS — L299 Pruritus, unspecified: Secondary | ICD-10-CM | POA: Insufficient documentation

## 2020-05-04 DIAGNOSIS — R3989 Other symptoms and signs involving the genitourinary system: Secondary | ICD-10-CM

## 2020-05-04 DIAGNOSIS — N898 Other specified noninflammatory disorders of vagina: Secondary | ICD-10-CM

## 2020-05-04 LAB — POCT URINALYSIS DIP (MANUAL ENTRY)
Blood, UA: NEGATIVE
Glucose, UA: 100 mg/dL — AB
Nitrite, UA: NEGATIVE
Spec Grav, UA: >=1.03 — AB (ref 1.010–1.025)
Urobilinogen, UA: 1 E.U./dL
pH, UA: 5 (ref 5.0–8.0)

## 2020-05-04 MED ORDER — FEXOFENADINE HCL 60 MG PO TABS: 120.0000 mg | ORAL_TABLET | Freq: Two times a day (BID) | ORAL | 0 refills | Status: DC

## 2020-05-04 NOTE — ED Provider Notes (Addendum)
Morgan Duran CARE    CSN: [id number] Arrival date & time: 05/04/20  1142      History   Chief Complaint Chief Complaint  Patient presents with  . Pruritis    HPI Morgan Duran is a 55 y.o. female.   HPI  Patient is here for pruritus. She states it is getting worse. She states it has been her entire body that itches for a week. She states that she has scratched her skin to the point of causing a rash on both forearms due to the constant rubbing and scratching. No hives. No rash. No shortness of breath. She states that she has had no new foods, medicines, lotions, supplements, or exposure to anything that would cause an allergic reaction. She has not had any hives, difficulty speaking or swallowing, shortness of breath. She states that her health in general has been stable. She did have an upper respiratory infection about 2 weeks ago, some cough and runny nose. She did not have Covid testing. She has not had any Covid exposure. She has had Covid vaccinations. Patient has well-controlled diabetes. Her most recent hemoglobin A1c is 6.3. 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  No one in her family has had any rash or itching  Patient states she has been eating and drinking normally.  She has noticed that her urine is brown Past Medical History:  Diagnosis Date  . Anxiety   . Arthritis   . Benign cyst of left breast 04/09/2019  . Diabetes mellitus without complication ([place])    type 2  . Heart murmur    at birth, no problems  . Hyperlipidemia   . Hypertension   . Migraine   . Morbid obesity ([place])   . OSA on CPAP   . Sleep apnea    uses cpap nightly    Patient Active Problem List   Diagnosis Date Noted  . Benign cyst of left breast 04/09/2019  . Statin declined 12/24/2018  . History of osteoporosis 12/24/2018  . Colon cancer screening 05/29/2018  . Diabetic eye exam ([place]) 05/29/2018  . Diabetic nephropathy associated  with type 2 diabetes mellitus ([place]) 05/26/2018  . Diabetic polyneuropathy associated with type 2 diabetes mellitus ([place]) 05/26/2018  . Noncompliance with treatment plan 10/13/2017  . Perimenopausal menorrhagia 10/13/2017  . Migraine without aura and without status migrainosus, not intractable 09/30/2017  . Class 3 severe obesity due to excess calories with serious comorbidity and body mass index (BMI) of 40.0 to 44.9 in adult ([place]) 04/03/2017  . Hypertension associated with diabetes ([place]) 04/03/2017  . DJD (degenerative joint disease) 03/23/2017  . Aortic stenosis, mild 03/23/2017  . Dyslipidemia associated with type 2 diabetes mellitus ([place]) 01/21/2017  . OSA on CPAP 01/15/2017  . Systolic murmur [id number]  . Metrorrhagia 05/31/2016    Past Surgical History:  Procedure Laterality Date  . COLONOSCOPY  2003   [place] - Normal  . KNEE ARTHROSCOPY Right   . NASAL SEPTUM SURGERY    . SHOULDER ARTHROSCOPY Left   . TONSILLECTOMY    . UPPER GASTROINTESTINAL ENDOSCOPY  2003   in [place] - Normal    OB History    Gravida  2   Para  2   Term      Preterm      AB      Living        SAB      IAB  Ectopic      Multiple      Live Births               Home Medications    Prior to Admission medications   Medication Sig Start Date End Date Taking? Authorizing Provider  fexofenadine (ALLEGRA) 60 MG tablet Take 2 tablets (120 mg total) by mouth 2 (two) times daily. 05/04/20  Yes Raylene Everts, MD  glucose blood (ONE TOUCH ULTRA TEST) test strip USE AS INSTRUCTED 05/26/18  Yes Trixie Dredge, PA-C  metFORMIN (GLUCOPHAGE XR) 500 MG 24 hr tablet Take 1 tablet (500 mg total) by mouth daily with breakfast. 03/26/20  Yes Emeterio Reeve, DO  Multiple Vitamins-Minerals (MULTIVITAMIN WOMEN PO) Take by mouth.   Yes [provider]  acetaminophen (TYLENOL) 500 MG tablet Take 1,000 mg by mouth every 6 (six) hours as needed for mild pain, moderate pain  or headache.    [provider]  meloxicam (MOBIC) 7.5 MG tablet Take 7.5 mg by mouth 2 (two) times daily as needed. 10/20/19   [provider]  rizatriptan (MAXALT) 10 MG tablet Take 1 tablet (10 mg total) by mouth as needed for migraine. May repeat one dose in 2 hours if needed. Max 20 mg/day. Limit use to 2 days per week or less 03/26/20   Emeterio Reeve, DO  Semaglutide,0.25 or 0.5MG /DOS, (OZEMPIC, 0.25 OR 0.5 MG/DOSE,) 2 MG/1.5ML SOPN Inject 0.5 mg into the skin once a week. 03/26/20   Emeterio Reeve, DO  traMADol (ULTRAM) 50 MG tablet Take 50 mg by mouth every 8 (eight) hours as needed. 09/23/19   [provider]    Family History Family History  Problem Relation Age of Onset  . Emphysema Mother   . Diabetes Father   . Colon polyps Neg Hx   . Rectal cancer Neg Hx   . Stomach cancer Neg Hx     Social History Social History   Tobacco Use  . Smoking status: Never Smoker  . Smokeless tobacco: Never Used  Vaping Use  . Vaping Use: Never used  Substance Use Topics  . Alcohol use: Not Currently  . Drug use: Never     Allergies   Patient has no known allergies.   Review of Systems Review of Systems See HPI  Physical Exam Triage Vital Signs ED Triage Vitals  Enc Vitals Group     BP 05/04/20 1233 (!) 145/90     Pulse Rate 05/04/20 1233 89     Resp --      Temp 05/04/20 1233 98.5 F (36.9 C)     Temp Source 05/04/20 1233 Oral     SpO2 05/04/20 1233 96 %     Weight 05/04/20 1230 201 lb 12.8 oz (91.5 kg)     Height 05/04/20 1230 5' 4.5" (1.638 m)     Head Circumference --      Peak Flow --      Pain Score 05/04/20 1230 0     Pain Loc --      Pain Edu? --      Excl. in [place]? --    No data found.  Updated Vital Signs BP (!) 145/90 (BP Location: Right Arm)   Pulse 89   Temp 98.5 F (36.9 C) (Oral)   Ht 5' 4.5" (1.638 m)   Wt 91.5 kg   SpO2 96%   BMI 34.10 kg/m       Physical Exam Constitutional:      General:  She is not  in acute distress.    Appearance: Normal appearance. She is well-developed and well-nourished.     Comments: Constant rubbing and scratching of the skin  HENT:     Head: Normocephalic and atraumatic.     Right Ear: Tympanic membrane and ear canal normal.     Left Ear: Tympanic membrane and ear canal normal.     Nose: Nose normal. No congestion.     Mouth/Throat:     Mouth: Oropharynx is clear and moist. Mucous membranes are moist.     Comments: Mask is in place.  Oropharynx benign Eyes:     Conjunctiva/sclera: Conjunctivae normal.     Pupils: Pupils are equal, round, and reactive to light.  Cardiovascular:     Rate and Rhythm: Normal rate and regular rhythm.     Heart sounds: Murmur heard.    Pulmonary:     Effort: Pulmonary effort is normal. No respiratory distress.     Breath sounds: Normal breath sounds.  Abdominal:     General: There is no distension.     Palpations: Abdomen is soft.  Musculoskeletal:        General: No edema. Normal range of motion.     Cervical back: Normal range of motion and neck supple.  Skin:    General: Skin is warm and dry.     Comments: Skin is clear except for some minor excoriation.  There is no rash present  Neurological:     Mental Status: She is alert.  Psychiatric:        Behavior: Behavior normal.      UC Treatments / Results  Labs (all labs ordered are listed, but only abnormal results are displayed) Labs Reviewed  POCT URINALYSIS DIP (MANUAL ENTRY) - Abnormal; Notable for the following components:      Result Value   Color, UA other (*)    Clarity, UA cloudy (*)    Glucose, UA =100 (*)    Bilirubin, UA moderate (*)    Ketones, POC UA trace (5) (*)    Spec Grav, UA >=1.030 (*)    Protein Ur, POC trace (*)    Leukocytes, UA Small (1+) (*)    All other components within normal limits  URINE CULTURE  NOVEL CORONAVIRUS, NAA  COMPLETE METABOLIC PANEL WITH GFR  CBC WITH DIFFERENTIAL/PLATELET    EKG   Radiology No results  found.  Procedures Procedures (including critical care time)  Medications Ordered in UC Medications - No data to display  Initial Impression / Assessment and Plan / UC Course  I have reviewed the triage vital signs and the nursing notes.  Pertinent labs & imaging results that were available during my care of the patient were reviewed by me and considered in my medical decision making (see chart for details). I explained to the patient that pruritus is sometimes difficult to figure out.  Without any rash on the skin it could be a metabolic problem.  I can do some screening blood work to check her liver and kidney functions, and CBC.  I am a little worried about her bilirubin in the urine, dark urine, and itching.  These could indicate liver impairment.  We will treat her with antihistamines in the meantime, let her follow-up with her PCP   Final Clinical Impressions(s) / UC Diagnoses   Final diagnoses:  Urine discoloration  Pruritus     Discharge Instructions     Take the allegra 2 x a day Check  my chart for your test results Follow up with your primary care doctor [place]   ED Prescriptions    Medication Sig Dispense Auth. Provider   fexofenadine (ALLEGRA) 60 MG tablet Take 2 tablets (120 mg total) by mouth 2 (two) times daily. 40 tablet Raylene Everts, MD     PDMP not reviewed this encounter.   Raylene Everts, MD 05/04/20 1449    Raylene Everts, MD 05/04/20 [phone]

## 2020-05-04 NOTE — ED Triage Notes (Signed)
Pt states that she has some itching all over x1 week. Pt states that she also has some vaginal itching, dark urine. x1week

## 2020-05-04 NOTE — Discharge Instructions (Signed)
Take the allegra 2 x a day Check my chart for your test results Follow up with your primary care doctor [place]

## 2020-05-05 ENCOUNTER — Telehealth: Payer: Self-pay | Admitting: Emergency Medicine

## 2020-05-05 ENCOUNTER — Encounter: Payer: Self-pay | Admitting: Osteopathic Medicine

## 2020-05-05 LAB — COMPLETE METABOLIC PANEL WITH GFR
AG Ratio: 1.5 (calc) (ref 1.0–2.5)
ALT: 384 U/L — ABNORMAL HIGH (ref 6–29)
AST: 168 U/L — ABNORMAL HIGH (ref 10–35)
Albumin: 4.1 g/dL (ref 3.6–5.1)
Alkaline phosphatase (APISO): 257 U/L — ABNORMAL HIGH (ref 37–153)
BUN: 23 mg/dL (ref 7–25)
CO2: 23 mmol/L (ref 20–32)
Calcium: 9.8 mg/dL (ref 8.6–10.4)
Chloride: 100 mmol/L (ref 98–110)
Creat: 0.66 mg/dL (ref 0.50–1.05)
GFR, Est African American: 117 mL/min/{1.73_m2} (ref >=60)
GFR, Est Non African American: 101 mL/min/{1.73_m2} (ref >=60)
Globulin: 2.8 g/dL (calc) (ref 1.9–3.7)
Glucose, Bld: 199 mg/dL — ABNORMAL HIGH (ref 65–99)
Potassium: 4.5 mmol/L (ref 3.5–5.3)
Sodium: 135 mmol/L (ref 135–146)
Total Bilirubin: 4.3 mg/dL — ABNORMAL HIGH (ref 0.2–1.2)
Total Protein: 6.9 g/dL (ref 6.1–8.1)

## 2020-05-05 LAB — CBC WITH DIFFERENTIAL/PLATELET
Absolute Monocytes: 510 cells/uL (ref 200–950)
Basophils Absolute: 73 cells/uL (ref 0–200)
Basophils Relative: 1.3 %
Eosinophils Absolute: 302 cells/uL (ref 15–500)
Eosinophils Relative: 5.4 %
HCT: 38.8 % (ref 35.0–45.0)
Hemoglobin: 13.4 g/dL (ref 11.7–15.5)
Lymphs Abs: 1002 cells/uL (ref 850–3900)
MCH: 31.4 pg (ref 27.0–33.0)
MCHC: 34.5 g/dL (ref 32.0–36.0)
MCV: 90.9 fL (ref 80.0–100.0)
MPV: 11.8 fL (ref 7.5–12.5)
Monocytes Relative: 9.1 %
Neutro Abs: 3713 cells/uL (ref 1500–7800)
Neutrophils Relative %: 66.3 %
Platelets: 354 10*3/uL (ref 140–400)
RBC: 4.27 10*6/uL (ref 3.80–5.10)
RDW: 13.1 % (ref 11.0–15.0)
Total Lymphocyte: 17.9 %
WBC: 5.6 10*3/uL (ref 3.8–10.8)

## 2020-05-05 LAB — SARS-COV-2, NAA 2 DAY TAT

## 2020-05-05 LAB — NOVEL CORONAVIRUS, NAA: SARS-CoV-2, NAA: NOT DETECTED

## 2020-05-05 NOTE — Telephone Encounter (Signed)
CMP resulted, AST, ALT, Alkaline Phosphatase & glucose were all elevated. Pt's PCP is Dr Sheppard Coil, results walked over to PCP. Dr Sheppard Coil would like to see Gabriell today or tomorrow. RN confirmed with front desk at PCP that appointment was scheduled for tomorrow & they would call the patient and let her know that she needed to be seen tomorrow.

## 2020-05-05 NOTE — Telephone Encounter (Signed)
Attempted to call patient.  Left message  YSN

## 2020-05-06 ENCOUNTER — Other Ambulatory Visit: Payer: Self-pay

## 2020-05-06 ENCOUNTER — Ambulatory Visit (INDEPENDENT_AMBULATORY_CARE_PROVIDER_SITE_OTHER): Payer: BC Managed Care – PPO | Admitting: Osteopathic Medicine

## 2020-05-06 ENCOUNTER — Ambulatory Visit (INDEPENDENT_AMBULATORY_CARE_PROVIDER_SITE_OTHER): Payer: BC Managed Care – PPO

## 2020-05-06 ENCOUNTER — Encounter: Payer: Self-pay | Admitting: Osteopathic Medicine

## 2020-05-06 VITALS — BP 141/88 | HR 85 | Temp 97.5°F | Wt 213.0 lb

## 2020-05-06 DIAGNOSIS — R748 Abnormal levels of other serum enzymes: Secondary | ICD-10-CM

## 2020-05-06 DIAGNOSIS — K838 Other specified diseases of biliary tract: Secondary | ICD-10-CM

## 2020-05-06 DIAGNOSIS — K824 Cholesterolosis of gallbladder: Secondary | ICD-10-CM | POA: Diagnosis not present

## 2020-05-06 DIAGNOSIS — R945 Abnormal results of liver function studies: Secondary | ICD-10-CM | POA: Diagnosis not present

## 2020-05-06 LAB — CERVICOVAGINAL ANCILLARY ONLY
Bacterial Vaginitis (gardnerella): NEGATIVE
Candida Glabrata: NEGATIVE
Candida Vaginitis: NEGATIVE
Comment: NEGATIVE
Comment: NEGATIVE
Comment: NEGATIVE

## 2020-05-06 LAB — URINE CULTURE
MICRO NUMBER:: 11475559
Result:: NO GROWTH
SPECIMEN QUALITY:: ADEQUATE

## 2020-05-06 NOTE — Progress Notes (Signed)
HPI: Morgan Duran is a 55 y.o. female who  has a past medical history of Anxiety, Arthritis, Benign cyst of left breast (04/09/2019), Diabetes mellitus without complication ([place]), Heart murmur, Hyperlipidemia, Hypertension, Migraine, Morbid obesity ([place]), OSA on CPAP, and Sleep apnea.  she presents to [facility] today, 05/06/20,  for chief complaint of:  Abnormal labs in UC  Reports RUQ pain ongoing few weeks then subsequent development of significant itching. Labs in UC showed significantly abnormal liver enzymes. On exam, (+)RUQ tenderness, mild jaundice / scleral icterus.       ASSESSMENT/PLAN: The primary encounter diagnosis was Elevated liver enzymes. A diagnosis of Dilated bile duct was also pertinent to this visit.  See pt instructions below US(+) dilated bile duct may need ERCP/MRCP to evaluate No obvious GB stone, no GB infection, liver looks good Patient was called to review results   Orders Placed This Encounter  Procedures  . US ABDOMEN LIMITED RUQ (LIVER/GB)  . TSH  . CP4508-PT/INR AND PTT  . Hepatitis C antibody  . Hepatitis B surface antigen  . Gamma GT  . Serum protein electrophoresis with reflex  . Ceruloplasmin  . Fe+TIBC+Fer     No orders of the defined types were placed in this encounter.   Patient Instructions  STOP/HOLD:  Tylenol  Ozempic  Tramadol   PLAN: Something is going on with the liver, I think Gallbladder is most likely the problem. Will get ultrasound today to confirm. If [place] confirm gallbladder issue, will get you into surgeon ASAP. If US shows totally normal gallbladder / other liver problem, will need labs and maybe GI specialist consult depending what we find!       US ABDOMEN LIMITED RUQ (LIVER/GB)  Result Date: 05/06/2020 CLINICAL DATA:  Elevated liver enzymes. EXAM: ULTRASOUND ABDOMEN LIMITED RIGHT UPPER QUADRANT COMPARISON:  None. FINDINGS: Gallbladder: No gallstones or wall thickening  visualized. Small volume sludge in the gallbladder. 5 mm nonshadowing echogenic focus projecting from the gallbladder wall into the lumen compatible with a polyp. No sonographic Murphy sign noted by sonographer. Common bile duct: Diameter: 7 mm Liver: No focal lesion identified. Within normal limits in parenchymal echogenicity. Portal vein is patent on color Doppler imaging with normal direction of blood flow towards the liver. Other: None. IMPRESSION: 1. Slight common bile duct dilatation. Given the clinical history of abnormal laboratory results, consider further evaluation with MRCP or ERCP. 2. 5 mm gallbladder polyp. No imaging follow-up is recommended for a polyp of this size. No gallstones. 3. Unremarkable appearance of the liver. Electronically Signed   By: Logan Bores M.D.   On: 05/06/2020 15:14   --> referring to GI for further workup!      Follow-up plan: Return for RECHECK PENDING RESULTS / IF WORSE OR CHANGE.                                                 ################################################# ################################################# ################################################# #################################################    Current Meds  Medication Sig  . acetaminophen (TYLENOL) 500 MG tablet Take 1,000 mg by mouth every 6 (six) hours as needed for mild pain, moderate pain or headache.  . fexofenadine (ALLEGRA) 60 MG tablet Take 2 tablets (120 mg total) by mouth 2 (two) times daily.  Marland Kitchen glucose blood (ONE TOUCH ULTRA TEST) test strip USE AS INSTRUCTED  . meloxicam (MOBIC)  7.5 MG tablet Take 7.5 mg by mouth 2 (two) times daily as needed.  . metFORMIN (GLUCOPHAGE XR) 500 MG 24 hr tablet Take 1 tablet (500 mg total) by mouth daily with breakfast.  . Multiple Vitamins-Minerals (MULTIVITAMIN WOMEN PO) Take by mouth.  . rizatriptan (MAXALT) 10 MG tablet Take 1 tablet (10 mg total) by mouth as needed for  migraine. May repeat one dose in 2 hours if needed. Max 20 mg/day. Limit use to 2 days per week or less  . traMADol (ULTRAM) 50 MG tablet Take 50 mg by mouth every 8 (eight) hours as needed.    No Known Allergies     Review of Systems: Pertinent (+) and (-) ROS in HPI as above   Exam:  BP (!) 141/88 (BP Location: Left Arm, Patient Position: Sitting, Cuff Size: Large)   Pulse 85   Temp (!) 97.5 F (36.4 C) (Oral)   Wt 213 lb (96.6 kg)   BMI 36.00 kg/m   Constitutional: VS see above. General Appearance: alert, well-developed, well-nourished, NAD  Neck: No masses, trachea midline.   Respiratory: Normal respiratory effort. no wheeze, no rhonchi, no rales  Cardiovascular: S1/S2 normal, no murmur, no rub/gallop auscultated. RRR.   Musculoskeletal: Gait normal. Symmetric and independent movement of all extremities  Abdominal: significant RUQ tenderness limits exam, non-distended, no appreciable organomegaly, (+)Murphy's, BS WNLx4  Neurological: Normal balance/coordination. No tremor.  Skin: warm, dry, intact. Mild jaundice   Psychiatric: Normal judgment/insight. Normal mood and affect. Oriented x3.       Visit summary with medication list and pertinent instructions was printed for patient to review, patient was advised to alert [place] if any updates are needed. All questions at time of visit were answered - patient instructed to contact office with any additional concerns. ER/RTC precautions were reviewed with the patient and understanding verbalized.      Please note: voice recognition software was used to produce this document, and typos may escape review. Please contact Dr. Sheppard Coil for any needed clarifications.    Follow up plan: Return for RECHECK PENDING RESULTS / IF WORSE OR CHANGE.

## 2020-05-06 NOTE — Patient Instructions (Addendum)
STOP/HOLD:  Tylenol  Ozempic  Tramadol   PLAN: Something is going on with the liver, I think Gallbladder is most likely the problem. Will get ultrasound today to confirm. If [place] confirm gallbladder issue, will get you into surgeon ASAP. If US shows totally normal gallbladder / other liver problem, will need labs and maybe GI specialist consult depending what we find!

## 2020-05-07 ENCOUNTER — Other Ambulatory Visit: Payer: Self-pay | Admitting: Gastroenterology

## 2020-05-07 ENCOUNTER — Encounter: Payer: Self-pay | Admitting: Osteopathic Medicine

## 2020-05-07 ENCOUNTER — Telehealth: Payer: Self-pay | Admitting: Gastroenterology

## 2020-05-07 DIAGNOSIS — K838 Other specified diseases of biliary tract: Secondary | ICD-10-CM

## 2020-05-07 DIAGNOSIS — R935 Abnormal findings on diagnostic imaging of other abdominal regions, including retroperitoneum: Secondary | ICD-10-CM

## 2020-05-07 DIAGNOSIS — R1011 Right upper quadrant pain: Secondary | ICD-10-CM | POA: Diagnosis not present

## 2020-05-07 NOTE — Telephone Encounter (Signed)
This is a new urgent referral. Would you be able to see her one day this week as a work in?

## 2020-05-07 NOTE — Telephone Encounter (Signed)
Hi Morgan Duran, we received an urgent referral for this pt for elevated liver enzymes and dilated bile duct. I spoke with pt and offered Dr. Loletha Grayer first available on 2/10. However, she stated that she needs to be seen sooner than that, this week if possible. She said that she had an imaging test that revealed some blockage. Jaclyn Shaggy does not have anything sooner than 2/10 either. Thank you.

## 2020-05-07 NOTE — Telephone Encounter (Signed)
Spoke to patient to inform her of Dr Vivia Ewing recommendations. She does not have any symptoms of fever or abdominal pain at this time. She is taking Pos well. She has an appointment om 05/08/20 with Dr Bryan Lemma at 10 am. She knows to go to the ED for worsening symptoms. All questions answered. Patient voiced understanding

## 2020-05-07 NOTE — Telephone Encounter (Signed)
Chart reviewed. Was seen in the UC last week and significantly elevated AST/ALT/ALP and Tbili. RUQ [place] yesterday with mildly dilated CBD at 7 mm, small GB polyp (5 mm), but no cholecystitis. Recommend the following:  - If ongoing sxs, or if fever, not tolerating PO, increasing pain, or other concerns, recommend ER for expedited w/u and likely admission - MRCP ASAP - Repeat LAEs and check acute viral hepatitis panel - Ok to book appt with me this week

## 2020-05-08 ENCOUNTER — Other Ambulatory Visit: Payer: Self-pay

## 2020-05-08 ENCOUNTER — Encounter: Payer: Self-pay | Admitting: Gastroenterology

## 2020-05-08 ENCOUNTER — Ambulatory Visit (INDEPENDENT_AMBULATORY_CARE_PROVIDER_SITE_OTHER): Payer: BC Managed Care – PPO | Admitting: Gastroenterology

## 2020-05-08 ENCOUNTER — Other Ambulatory Visit (INDEPENDENT_AMBULATORY_CARE_PROVIDER_SITE_OTHER): Payer: BC Managed Care – PPO

## 2020-05-08 VITALS — BP 128/84 | HR 91 | Ht 64.5 in | Wt 213.1 lb

## 2020-05-08 DIAGNOSIS — K838 Other specified diseases of biliary tract: Secondary | ICD-10-CM | POA: Diagnosis not present

## 2020-05-08 DIAGNOSIS — K824 Cholesterolosis of gallbladder: Secondary | ICD-10-CM

## 2020-05-08 DIAGNOSIS — R933 Abnormal findings on diagnostic imaging of other parts of digestive tract: Secondary | ICD-10-CM

## 2020-05-08 DIAGNOSIS — R17 Unspecified jaundice: Secondary | ICD-10-CM

## 2020-05-08 DIAGNOSIS — R935 Abnormal findings on diagnostic imaging of other abdominal regions, including retroperitoneum: Secondary | ICD-10-CM

## 2020-05-08 DIAGNOSIS — K828 Other specified diseases of gallbladder: Secondary | ICD-10-CM

## 2020-05-08 DIAGNOSIS — R7401 Elevation of levels of liver transaminase levels: Secondary | ICD-10-CM

## 2020-05-08 DIAGNOSIS — R1011 Right upper quadrant pain: Secondary | ICD-10-CM | POA: Diagnosis not present

## 2020-05-08 MED ORDER — HYDROXYZINE HCL 25 MG PO TABS: 25.0000 mg | ORAL_TABLET | Freq: Every day | ORAL | 0 refills | Status: DC

## 2020-05-08 NOTE — Progress Notes (Signed)
Chief Complaint: Pruritus, hyperbilirubinemia, abnormal CT, nausea/vomiting   Referring Provider:     Emeterio Reeve, DO   HPI:     Morgan Duran is a 55 y.o. female with history of diabetes, HTN, HLD, obesity (BMI 36), OSA (on CPAP), anxiety, arthritis, referred to the Gastroenterology Clinic for evaluation of abnormal liver enzymes.   Sxs started acutely 2 weeks ago as abdominal pain "like I was punched in the stomach" and n/v. Pain has improved, but n/v continued followed by diffuse pruritis. No skin rash or excoriations. No clear preceding events, change in meds, detergents, infection, Abx, travel, sick contacts, etc. Did have gray colored stool a week ago, but improving. No fevers.   Does not drink. Monogomous relationship with husband of 30+ years. No hx of blood transfusions.   05/04/2020: Seen in the Urgent Care for pruritus evaluation notable for the following: -AST/ALT/ALP 168/304/257, T bili 4.5 (normal CMP on 03/26/2020) -Normal CBC  05/06/2020: Seen by PCM.  Ordered extended lab panel, ultrasound, and expedited GI referral -RUQ [place]: 7 mm CBD, small volume GB sludge and 5 mm GB polyp without cholecystitis.  Normal liver -Started Allegra  05/07/2020: Seen at [facility] ER -Normal CBC, lipase -AST/ALT/ALP 156/351/225, T bili 3.5 -CT abdomen/pelvis: Normal liver, 6 mm CBD, unremarkable GB.  Circumferential thickening of the distal esophagus, otherwise normal   Endoscopic History: -Colonoscopy (11/2019): 5 mm polyp, internal hemorrhoids, diverticulosis   Past Medical History:  Diagnosis Date  . Anxiety   . Arthritis   . Benign cyst of left breast 04/09/2019  . Diabetes mellitus without complication ([place])    type 2  . Heart murmur    at birth, no problems  . Hyperlipidemia   . Hypertension   . Migraine   . Morbid obesity ([place])   . OSA on CPAP   . Sleep apnea    uses cpap nightly     Past Surgical History:  Procedure Laterality Date  . COLONOSCOPY  2003    [place] - Normal  . KNEE ARTHROSCOPY Right   . NASAL SEPTUM SURGERY    . SHOULDER ARTHROSCOPY Left   . TONSILLECTOMY    . UPPER GASTROINTESTINAL ENDOSCOPY  2003   in [place] - Normal   Family History  Problem Relation Age of Onset  . Emphysema Mother   . Diabetes Father   . Colon polyps Neg Hx   . Rectal cancer Neg Hx   . Stomach cancer Neg Hx    Social History   Tobacco Use  . Smoking status: Never Smoker  . Smokeless tobacco: Never Used  Vaping Use  . Vaping Use: Never used  Substance Use Topics  . Alcohol use: Not Currently  . Drug use: Never   Current Outpatient Medications  Medication Sig Dispense Refill  . fexofenadine (ALLEGRA) 60 MG tablet Take 2 tablets (120 mg total) by mouth 2 (two) times daily. 40 tablet 0  . glucose blood (ONE TOUCH ULTRA TEST) test strip USE AS INSTRUCTED 100 each 1  . metFORMIN (GLUCOPHAGE XR) 500 MG 24 hr tablet Take 1 tablet (500 mg total) by mouth daily with breakfast. 90 tablet 3  . Multiple Vitamins-Minerals (MULTIVITAMIN WOMEN PO) Take by mouth.    . rizatriptan (MAXALT) 10 MG tablet Take 1 tablet (10 mg total) by mouth as needed for migraine. May repeat one dose in 2 hours if needed. Max 20 mg/day. Limit use to 2 days per week  or less 18 tablet 5  . meloxicam (MOBIC) 7.5 MG tablet Take 7.5 mg by mouth 2 (two) times daily as needed. (Patient not taking: Reported on 05/08/2020)     No current facility-administered medications for this visit.   No Known Allergies   Review of Systems: All systems reviewed and negative except where noted in HPI.     Physical Exam:    Wt Readings from Last 3 Encounters:  05/08/20 213 lb 2 oz (96.7 kg)  05/06/20 213 lb (96.6 kg)  05/04/20 201 lb 12.8 oz (91.5 kg)    BP 128/84   Pulse 91   Wt 213 lb 2 oz (96.7 kg)   BMI 36.02 kg/m  Constitutional:  Pleasant, in no acute distress. Psychiatric: Normal mood and affect. Behavior is normal. EENT: Icteric sclera.  Neck supple. No cervical  LAD. Cardiovascular: Normal rate, regular rhythm. No edema Pulmonary/chest: Effort normal and breath sounds normal. No wheezing, rales or rhonchi. Abdominal: Mild TTP in RUQ and epigastrium. No rebound or guarding. No peritoneal signs. Soft, nondistended. Bowel sounds active throughout. There are no masses palpable. No hepatomegaly. Neurological: Alert and oriented to person place and time. Skin: Skin is warm and dry. No rashes noted.   ASSESSMENT AND PLAN;   1) Hyperbilirubinemia 2) Elevated AST/ALT 3) Elevated alkaline phosphatase 4) Abdominal pain 5) GB polyp and GB sludge on ultrasound 6) Mildly dilated CBD (7 mm on ultrasound, 6 mm on CT)  - Check acute viral hepatitis panel along with EBV, CMV, HSV, IgG, ANA - Repeat liver enzymes tomorrow again - May eventually need referral to [facility] for GB polyp and GB sludge - Start Atarax 25 mg. Start qhs and can increase to prn Q8H as needed - MRCP already scheduled  7) Esophagitis on CT -Suspect related to recent nausea/vomiting -Can consider EGD after work-up of the more pressing hepatobiliary issues as above   Lavena Bullion, DO, FACG  05/08/2020, 10:43 AM   Emeterio Reeve, DO

## 2020-05-08 NOTE — Addendum Note (Signed)
Addended by: Kelle Darting A on: 05/08/2020 12:25 PM   Modules accepted: Orders

## 2020-05-08 NOTE — Patient Instructions (Signed)
If you are age 54 or older, your body mass index should be between 23-30. Your Body mass index is 36.02 kg/m. If this is out of the aforementioned range listed, please consider follow up with your Primary Care Provider.  If you are age 27 or younger, your body mass index should be between 19-25. Your Body mass index is 36.02 kg/m. If this is out of the aformentioned range listed, please consider follow up with your Primary Care Provider.   Please go to the lab today in our building on the second floor [address] and schedule your bloodwork. You will also need to return tomorrow to have your Liver function test completed.  We have sent the following medications to your pharmacy for you to pick up at your convenience:  Atarax 25mg  at bedtime  Due to recent changes in healthcare laws, you may see the results of your imaging and laboratory studies on MyChart before your provider has had a chance to review them.  We understand that in some cases there may be results that are confusing or concerning to you. Not all laboratory results come back in the same time frame and the provider may be waiting for multiple results in order to interpret others.  Please give [place] 48 hours in order for your provider to thoroughly review all the results before contacting the office for clarification of your results.   Thank you for choosing me and [facility].  Vito Cirigliano, D.O.

## 2020-05-08 NOTE — Telephone Encounter (Signed)
Patient is scheduled today at 10:00 in [place]

## 2020-05-09 ENCOUNTER — Other Ambulatory Visit (INDEPENDENT_AMBULATORY_CARE_PROVIDER_SITE_OTHER): Payer: BC Managed Care – PPO

## 2020-05-09 DIAGNOSIS — R7401 Elevation of levels of liver transaminase levels: Secondary | ICD-10-CM

## 2020-05-09 DIAGNOSIS — R17 Unspecified jaundice: Secondary | ICD-10-CM | POA: Diagnosis not present

## 2020-05-09 LAB — HEPATIC FUNCTION PANEL
ALT: 321 U/L — ABNORMAL HIGH (ref 0–35)
AST: 136 U/L — ABNORMAL HIGH (ref 0–37)
Albumin: 3.9 g/dL (ref 3.5–5.2)
Alkaline Phosphatase: 196 U/L — ABNORMAL HIGH (ref 39–117)
Bilirubin, Direct: 1.3 mg/dL — ABNORMAL HIGH (ref 0.0–0.3)
Total Bilirubin: 2.4 mg/dL — ABNORMAL HIGH (ref 0.2–1.2)
Total Protein: 7 g/dL (ref 6.0–8.3)

## 2020-05-10 LAB — HEPATITIS B SURFACE ANTIGEN: Hepatitis B Surface Ag: NONREACTIVE

## 2020-05-10 LAB — BUN/CREATININE RATIO
BUN: 20 mg/dL (ref 7–25)
Creat: 0.75 mg/dL (ref 0.50–1.05)
GFR, Est African American: 105 mL/min/{1.73_m2} (ref >=60)
GFR, Est Non African American: 91 mL/min/{1.73_m2} (ref >=60)

## 2020-05-10 LAB — IGM: IgM, Serum: 272 mg/dL (ref 50–300)

## 2020-05-10 LAB — HEPATITIS B SURFACE ANTIBODY,QUALITATIVE: Hep B S Ab: NONREACTIVE

## 2020-05-10 LAB — ANA: Anti Nuclear Antibody (ANA): POSITIVE — AB

## 2020-05-10 LAB — ANTI-NUCLEAR AB-TITER (ANA TITER)
ANA TITER: 1:80 {titer} — ABNORMAL HIGH
ANA Titer 1: 1:80 {titer} — ABNORMAL HIGH

## 2020-05-10 LAB — CMV IGM: CMV IgM: <30 AU/mL

## 2020-05-10 LAB — HSV(HERPES SMPLX)ABS-I+II(IGG+IGM)-BLD
HSV 1 Glycoprotein G Ab, IgG: 37.5 index — ABNORMAL HIGH (ref 0.00–0.90)
HSV 2 IgG, Type Spec: <0.91 index (ref 0.00–0.90)
HSVI/II Comb IgM: 2.33 Ratio — ABNORMAL HIGH (ref 0.00–0.90)

## 2020-05-10 LAB — HEPATITIS A ANTIBODY, TOTAL: Hepatitis A AB,Total: BORDERLINE — AB

## 2020-05-10 LAB — IGG: IgG (Immunoglobin G), Serum: 896 mg/dL (ref 600–1640)

## 2020-05-13 NOTE — Addendum Note (Signed)
Addended by: Lanny Hurst A on: 05/13/2020 12:12 PM   Modules accepted: Orders

## 2020-05-14 ENCOUNTER — Ambulatory Visit (HOSPITAL_COMMUNITY)
Admission: RE | Admit: 2020-05-14 | Discharge: 2020-05-14 | Disposition: A | Discharge status: Home / Self Care | Payer: BC Managed Care – PPO | Source: Ambulatory Visit | Attending: Gastroenterology | Admitting: Gastroenterology

## 2020-05-14 ENCOUNTER — Other Ambulatory Visit: Payer: Self-pay | Admitting: Gastroenterology

## 2020-05-14 ENCOUNTER — Other Ambulatory Visit: Payer: Self-pay

## 2020-05-14 DIAGNOSIS — K838 Other specified diseases of biliary tract: Secondary | ICD-10-CM

## 2020-05-14 DIAGNOSIS — R935 Abnormal findings on diagnostic imaging of other abdominal regions, including retroperitoneum: Secondary | ICD-10-CM | POA: Diagnosis not present

## 2020-05-14 MED ORDER — GADOBUTROL 1 MMOL/ML IV SOLN
10.0000 mL | Freq: Once | INTRAVENOUS | Status: AC | PRN
  Administered 2020-05-14: 10 mL via INTRAVENOUS

## 2020-05-16 ENCOUNTER — Other Ambulatory Visit (INDEPENDENT_AMBULATORY_CARE_PROVIDER_SITE_OTHER): Payer: BC Managed Care – PPO

## 2020-05-16 ENCOUNTER — Other Ambulatory Visit: Payer: Self-pay | Admitting: Gastroenterology

## 2020-05-16 DIAGNOSIS — R17 Unspecified jaundice: Secondary | ICD-10-CM

## 2020-05-16 DIAGNOSIS — R1011 Right upper quadrant pain: Secondary | ICD-10-CM

## 2020-05-16 DIAGNOSIS — R7401 Elevation of levels of liver transaminase levels: Secondary | ICD-10-CM

## 2020-05-16 LAB — HEPATIC FUNCTION PANEL
ALT: 112 U/L — ABNORMAL HIGH (ref 0–35)
AST: 36 U/L (ref 0–37)
Albumin: 3.9 g/dL (ref 3.5–5.2)
Alkaline Phosphatase: 134 U/L — ABNORMAL HIGH (ref 39–117)
Bilirubin, Direct: 0.6 mg/dL — ABNORMAL HIGH (ref 0.0–0.3)
Total Bilirubin: 1.4 mg/dL — ABNORMAL HIGH (ref 0.2–1.2)
Total Protein: 6.9 g/dL (ref 6.0–8.3)

## 2020-05-16 LAB — PROTIME-INR
INR: 1 ratio (ref 0.8–1.0)
Prothrombin Time: 11 s (ref 9.6–13.1)

## 2020-05-16 MED ORDER — ACYCLOVIR 400 MG PO TABS: 400.0000 mg | ORAL_TABLET | Freq: Three times a day (TID) | ORAL | 0 refills | Status: DC

## 2020-05-22 ENCOUNTER — Ambulatory Visit (HOSPITAL_BASED_OUTPATIENT_CLINIC_OR_DEPARTMENT_OTHER): Payer: BC Managed Care – PPO | Attending: Internal Medicine | Admitting: Internal Medicine

## 2020-05-22 ENCOUNTER — Other Ambulatory Visit: Payer: Self-pay

## 2020-05-22 VITALS — Ht 64.0 in | Wt 220.0 lb

## 2020-05-22 DIAGNOSIS — G4733 Obstructive sleep apnea (adult) (pediatric): Secondary | ICD-10-CM | POA: Insufficient documentation

## 2020-05-23 ENCOUNTER — Ambulatory Visit (INDEPENDENT_AMBULATORY_CARE_PROVIDER_SITE_OTHER): Payer: BC Managed Care – PPO | Admitting: Gastroenterology

## 2020-05-23 ENCOUNTER — Other Ambulatory Visit (INDEPENDENT_AMBULATORY_CARE_PROVIDER_SITE_OTHER): Payer: BC Managed Care – PPO

## 2020-05-23 ENCOUNTER — Encounter: Payer: Self-pay | Admitting: Gastroenterology

## 2020-05-23 VITALS — BP 120/84 | HR 80 | Ht 64.0 in | Wt 225.2 lb

## 2020-05-23 DIAGNOSIS — R7401 Elevation of levels of liver transaminase levels: Secondary | ICD-10-CM

## 2020-05-23 DIAGNOSIS — B199 Unspecified viral hepatitis without hepatic coma: Secondary | ICD-10-CM

## 2020-05-23 DIAGNOSIS — L299 Pruritus, unspecified: Secondary | ICD-10-CM | POA: Diagnosis not present

## 2020-05-23 DIAGNOSIS — R17 Unspecified jaundice: Secondary | ICD-10-CM

## 2020-05-23 DIAGNOSIS — R1011 Right upper quadrant pain: Secondary | ICD-10-CM

## 2020-05-23 LAB — HEPATITIS E VIRUS (HEV) ANTIBODIES (IGG, IGM)
Hepatitis E Antibody Igm: NOT DETECTED
Hepatitis E IgG Abs: NOT DETECTED

## 2020-05-23 LAB — ANTI-SMOOTH MUSCLE ANTIBODY, IGG: Actin (Smooth Muscle) Antibody (IGG): <20 U (ref <20)

## 2020-05-23 LAB — IRON,TIBC AND FERRITIN PANEL
%SAT: 25 % (calc) (ref 16–45)
Ferritin: 70 ng/mL (ref 16–232)
Iron: 119 ug/dL (ref 45–160)
TIBC: 476 mcg/dL (calc) — ABNORMAL HIGH (ref 250–450)

## 2020-05-23 LAB — ANTI-MICROSOMAL ANTIBODY LIVER / KIDNEY: LKM1 Ab: <=20 U (ref <=20.0)

## 2020-05-23 LAB — EPSTEIN-BARR VIRUS VCA, IGM: EBV VCA IgM: <36 U/mL

## 2020-05-23 LAB — MITOCHONDRIAL ANTIBODIES: Mitochondrial M2 Ab, IgG: <=20 U

## 2020-05-23 LAB — CERULOPLASMIN: Ceruloplasmin: 40 mg/dL (ref 18–53)

## 2020-05-23 NOTE — Progress Notes (Signed)
P  Chief Complaint:    Elevated liver enzymes, abdominal pain  GI History: 55 y.o. female with history of diabetes, HTN, HLD, obesity (BMI 36), OSA (on CPAP), anxiety, arthritis, initially seen in the GI clinic on 05/08/2020 for expedited evaluation of elevated liver enzymes and pruritus.  05/04/2020: Seen in the Urgent Care for pruritus evaluation notable for the following: -AST/ALT/ALP 168/304/257, T bili 4.5 (normal CMP on 03/26/2020) -Normal CBC  05/06/2020: Seen by PCM.  Ordered extended lab panel, ultrasound, and expedited GI referral -RUQ [place]: 7 mm CBD, small volume GB sludge and 5 mm GB polyp without cholecystitis.  Normal liver -Started Allegra  05/07/2020: Seen at [facility] -Normal CBC, lipase -AST/ALT/ALP 156/351/225, T bili 3.5 -CT abdomen/pelvis: Normal liver, 6 mm CBD, unremarkable GB.  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  Negative HSV-2 IgG. Started Acyclovir and was referred to Infectious Disease -HBsAb-, HBsAg-.  Needs vaccination eventually -HAV Ab borderline positive.  Can repeat in the future -Negative/normal EBV, CMV, IgM, IgG, IgA, ceruloplasmin, iron panel, AMA, ASMA, anti-LKM Ab, INR, -Hepatitis E still pending.  HCV never drawn -MRCP: Normal  05/09/2020: -AST/ALT/ALP 136/321/196, T bili 2.4  05/16/2020: -AST/ALT/ALP 36/112/134, T bili 1.4   Endoscopic History: -Colonoscopy (11/2019): 5 mm polyp, internal hemorrhoids, diverticulosis  HPI:     Patient is a 55 y.o. female presenting to the Gastroenterology Clinic for follow-up.  Initially seen by me on 05/08/2020 for elevated liver enzymes and pruritus.  Extended evaluation as outlined above.  Pruritis improved since starting Atarax.   Today, she states she feels better.   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 She never started the Acyclovir.   Review of systems:     No chest pain, no SOB, no fevers, no urinary sx   Past Medical History:  Diagnosis Date  . Anxiety   . Arthritis   . Benign cyst of left breast 04/09/2019  . Diabetes mellitus without complication ([place])    type 2  . Heart murmur    at birth, no problems  . Hyperlipidemia   . Hypertension   . Migraine   . Morbid obesity ([place])   . OSA on CPAP   . Sleep apnea    uses cpap nightly    Patient's surgical history, family medical history, social history, medications and allergies were all reviewed in Epic    Current Outpatient Medications  Medication Sig Dispense Refill  . acyclovir (ZOVIRAX) 400 MG tablet Take 1 tablet (400 mg total) by mouth 3 (three) times daily for 7 days. 21 tablet 0  . fexofenadine (ALLEGRA) 60 MG tablet Take 2 tablets (120 mg total) by mouth 2 (two) times daily. 40 tablet 0  . glucose blood (ONE TOUCH ULTRA TEST) test strip USE AS INSTRUCTED 100 each 1  . hydrOXYzine (ATARAX/VISTARIL) 25 MG tablet Take 1 tablet (25 mg total) by mouth at bedtime. 30 tablet 0  . meloxicam (MOBIC) 7.5 MG tablet Take 7.5 mg by mouth 2 (two) times daily as needed. (Patient not taking: Reported on 05/08/2020)    . metFORMIN (GLUCOPHAGE XR) 500 MG 24 hr tablet Take 1 tablet (500 mg total) by mouth daily with breakfast. 90 tablet 3  . Multiple Vitamins-Minerals (MULTIVITAMIN WOMEN PO) Take by mouth.    . rizatriptan (MAXALT)  10 MG tablet Take 1 tablet (10 mg total) by mouth as needed for migraine. May repeat one dose in 2 hours if needed. Max 20 mg/day. Limit use to 2 days per week or less 18 tablet 5   No current facility-administered medications for this visit.    Physical Exam:     Ht 5\' 4"  (1.626 m)   Wt 225 lb 4 oz (102.2 kg)   BMI 38.66 kg/m   GENERAL:  Pleasant female in NAD PSYCH: : Cooperative, normal affect NEURO: Alert and oriented x 3, no focal neurologic deficits   IMPRESSION and  PLAN:    1) Acute hepatitis 2) Pruritus-resolved 3) Abdominal pain-resolved 4) Elevated liver enzymes-improving 5) Hyperbilirubinemia-improving  Acute onset abdominal pain, nausea, pruritus in the setting of significantly elevated liver enzymes and cholestasis.  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  Otherwise work-up unremarkable.  Discussed all these findings at length today with patient.  Overall clinical presentation consistent with acute onset viral hepatitis.  The HSV panel is interesting, and I suspect most consistent with prior exposure to HSV 1 (does report a prior history of oral ulcers many years ago) but more recent exposure to HSV-2.  She has not yet started acyclovir.  - Check HCV to complete viral work-up - Will f/u on HEV when resulted -Can discontinue Atarax given resolution of pruritus - Repeat liver panel in 4 weeks to ensure normalization -Borderline elevated ANA, but no other serologic hallmarks of Autoimmune Hepatitis.  Given overall clinical and serologic improvement, no plan for liver biopsy at this juncture -Encourage patient to keep appointment with Infectious Disease scheduled for early next week - RTC in 3-6 months or sooner as needed     I spent 35 minutes of time, including in depth chart review, independent review of results as outlined above, communicating results with the patient directly, face-to-face time with the patient, coordinating care, and ordering studies and medications as appropriate, and documentation.   Glen Ellen ,DO, FACG 05/23/2020, 3:00 PM

## 2020-05-23 NOTE — Patient Instructions (Addendum)
If you are age 54 or older, your body mass index should be between 23-30. Your Body mass index is 38.66 kg/m. If this is out of the aforementioned range listed, please consider follow up with your Primary Care Provider.  If you are age 21 or younger, your body mass index should be between 19-25. Your Body mass index is 38.66 kg/m. If this is out of the aformentioned range listed, please consider follow up with your Primary Care Provider.   Please go to the lab on the 2nd floor [address] before you leave the office today.    We will also need for you to have blood work in 4 weeks, this will be around 06/20/2020 or after. We will contact you with the results.    Due to recent changes in healthcare laws, you may see the results of your imaging and laboratory studies on MyChart before your provider has had a chance to review them.  We understand that in some cases there may be results that are confusing or concerning to you. Not all laboratory results come back in the same time frame and the provider may be waiting for multiple results in order to interpret others.  Please give [place] 48 hours in order for your provider to thoroughly review all the results before contacting the office for clarification of your results.    Follow up as needed.  Thank you for choosing me and [facility].  Vito Cirigliano, D.O.

## 2020-05-26 ENCOUNTER — Telehealth: Payer: Self-pay | Admitting: General Surgery

## 2020-05-26 LAB — HEPATIC FUNCTION PANEL
AG Ratio: 1.6 (calc) (ref 1.0–2.5)
ALT: 46 U/L — ABNORMAL HIGH (ref 6–29)
AST: 24 U/L (ref 10–35)
Albumin: 3.9 g/dL (ref 3.6–5.1)
Alkaline phosphatase (APISO): 114 U/L (ref 37–153)
Bilirubin, Direct: 0.4 mg/dL — ABNORMAL HIGH (ref 0.0–0.2)
Globulin: 2.4 g/dL (calc) (ref 1.9–3.7)
Indirect Bilirubin: 0.5 mg/dL (calc) (ref 0.2–1.2)
Total Bilirubin: 0.9 mg/dL (ref 0.2–1.2)
Total Protein: 6.3 g/dL (ref 6.1–8.1)

## 2020-05-26 LAB — HEPATITIS C ANTIBODY
Hepatitis C Ab: NONREACTIVE
SIGNAL TO CUT-OFF: 0.01 (ref <1.00)

## 2020-05-26 NOTE — Telephone Encounter (Signed)
Left a detailed voicemail for the patient regarding her labs. Pt was told to have labs repeated in 4-6 weeks to ensure normalization. Pt instructed to call with any questions.

## 2020-05-26 NOTE — Telephone Encounter (Signed)
-----  Message from [place], DO sent at 05/26/2020  2:33 PM EST ----- The Liver enzymes continue to improve, with now normal bilirubin, Alk Phos, and AST, and just mildly elevated ALT (46), all consistent with resolving acute hepatic injury. Additionally, HCV and HEV negative. Will plan for repeat LAEs in 4-6 weeks to ensure complete normalization.

## 2020-05-27 ENCOUNTER — Other Ambulatory Visit: Payer: Self-pay

## 2020-05-27 ENCOUNTER — Encounter: Payer: Self-pay | Admitting: Internal Medicine

## 2020-05-27 ENCOUNTER — Ambulatory Visit (INDEPENDENT_AMBULATORY_CARE_PROVIDER_SITE_OTHER): Payer: BC Managed Care – PPO | Admitting: Internal Medicine

## 2020-05-27 ENCOUNTER — Telehealth: Payer: Self-pay | Admitting: Internal Medicine

## 2020-05-27 VITALS — BP 132/82 | HR 80 | Temp 97.6°F | Ht 64.0 in | Wt 230.0 lb

## 2020-05-27 DIAGNOSIS — B009 Herpesviral infection, unspecified: Secondary | ICD-10-CM | POA: Diagnosis not present

## 2020-05-27 DIAGNOSIS — R7989 Other specified abnormal findings of blood chemistry: Secondary | ICD-10-CM | POA: Diagnosis not present

## 2020-05-27 DIAGNOSIS — Z23 Encounter for immunization: Secondary | ICD-10-CM

## 2020-05-27 NOTE — Telephone Encounter (Signed)
Spoke with patient. She was calling to see if her recent sleep study showed that she qualified for a CPAP machine. She recently completed a split night study. I advised her I would send a message over to Dr. Annamaria Boots to advise about her sleep study results. She verbalized understanding.    Dr. Annamaria Boots, can you please advise on her sleep study results? Thanks!

## 2020-05-27 NOTE — Telephone Encounter (Signed)
This study hasn't been released by technicians for me to read yet. I will probably get it next weekend.

## 2020-05-27 NOTE — Patient Instructions (Signed)
Thank you for coming to see me today. It was a pleasure seeing you.  To Do: Marland Kitchen Follow up with your primary doctor and GI doctor . You can stop the acyclovir . Follow up with [place] as needed  If you have any questions or concerns, please do not hesitate to call the office at [phone].  Take Care,   Jule Ser, DO

## 2020-05-27 NOTE — Progress Notes (Signed)
[facility] for Infectious Disease  Reason for Consult: HSV  Referring Provider: Dr Sheppard Coil   HPI:    Morgan Duran is a 55 y.o. female with PMHx as below who presents to the clinic for further evaluation of positive HSV serologies in the setting of elevated liver enzymes.  She has a history of diabetes mellitus type 2, hypertension, hyperlipidemia, obesity, obstructive sleep apnea, anxiety.  She was seen on May 04, 2020 at urgent care for pruritus, abdominal pain, and nausea/vomiting.  At that visit her AST and ALT were elevated at 168 and 304 respectively.  Alk phos 257.  T bili 4.5.  Her CBC was normal.  She then followed up with her primary care doctor on February 1 who ordered an abdominal ultrasound which showed 7 mm slightly dilated common bile duct, small volume gallbladder sludge and a 5 mm polyp without cholecystitis.  There were no stones.  Her liver appeared normal.  She was then seen at the [facility] ER on 05/07/2020 where CBC and lipase were normal.  AST 156, ALT 351, alk phos 225, T bili 3.5.  CT abdomen/pelvis showed normal liver, 6 mm common bile duct, unremarkable gallbladder.  Seen for an expedited GI work-up in their clinic on 05/08/2020 where further lab work-up revealed ANA positive at 1: 80 titer.  HSV 1/2 IgM and HSV 1 IgG were positive.  HSV-2 IgG negative.  She was prescribed acyclovir, however, she did not start this medication.  She underwent MRCP which was normal.  Repeat labs 05/09/2020 with AST 136, ALT 321, alk phos 196, T bili 2.4.  Labs 05/16/2020 with improving liver enzymes.  She followed up most recently on 2/18 with gastroenterology where she was noted to be feeling much better with improved pruritus while on Atarax.  Her repeat labs showed that her bilirubin had now normalized, AST 24, ALT slightly elevated at 46, alk phos 114.  She started the acyclovir a couple days ago after this GI visit and has been taking it since with some reported fatigue.  Her  other viral hepatitis work-up at this time has been negative for hepatitis A, hepatitis B, or hepatitis C.  She did receive her Moderna vaccine at the end of December the few weeks prior to the symptoms developing.  She additionally has no history of biliary colic or cholelithiasis.   Patient's Medications  New Prescriptions   No medications on file  Previous Medications   FEXOFENADINE (ALLEGRA) 60 MG TABLET    Take 2 tablets (120 mg total) by mouth 2 (two) times daily.   GLUCOSE BLOOD (ONE TOUCH ULTRA TEST) TEST STRIP    USE AS INSTRUCTED   HYDROXYZINE (ATARAX/VISTARIL) 25 MG TABLET    Take 1 tablet (25 mg total) by mouth at bedtime.   MELOXICAM (MOBIC) 7.5 MG TABLET    Take 7.5 mg by mouth 2 (two) times daily as needed.   METFORMIN (GLUCOPHAGE XR) 500 MG 24 HR TABLET    Take 1 tablet (500 mg total) by mouth daily with breakfast.   MULTIPLE VITAMINS-MINERALS (MULTIVITAMIN WOMEN PO)    Take by mouth.   OMEGA-3 FATTY ACIDS (FISH OIL PO)    Take by mouth daily.   RIZATRIPTAN (MAXALT) 10 MG TABLET    Take 1 tablet (10 mg total) by mouth as needed for migraine. May repeat one dose in 2 hours if needed. Max 20 mg/day. Limit use to 2 days per week or less  Modified Medications  No medications on file  Discontinued Medications   ACYCLOVIR (ZOVIRAX) 400 MG TABLET    Take 400 mg by mouth 3 (three) times daily.      Past Medical History:  Diagnosis Date  . Anxiety   . Arthritis   . Benign cyst of left breast 04/09/2019  . Diabetes mellitus without complication ([place])    type 2  . Heart murmur    at birth, no problems  . Hyperlipidemia   . Hypertension   . Migraine   . Morbid obesity ([place])   . OSA on CPAP   . Sleep apnea    uses cpap nightly    Social History   Tobacco Use  . Smoking status: Never Smoker  . Smokeless tobacco: Never Used  Vaping Use  . Vaping Use: Never used  Substance Use Topics  . Alcohol use: Not Currently  . Drug use: Never    Family History  Problem  Relation Age of Onset  . Emphysema Mother   . Diabetes Father   . Colon polyps Neg Hx   . Rectal cancer Neg Hx   . Stomach cancer Neg Hx     No Known Allergies  Review of Systems  Constitutional: Negative for fever.  Gastrointestinal: Negative for abdominal pain, nausea and vomiting.  Skin: Negative for itching.  Psychiatric/Behavioral: The patient is nervous/anxious.       OBJECTIVE:    Vitals:   05/27/20 1114  BP: 132/82  Pulse: 80  Temp: 97.6 F (36.4 C)  TempSrc: Oral  SpO2: 98%  Weight: 230 lb (104.3 kg)  Height: $Remove'5\' 4"'HFFsvGj$  (1.626 m)     Body mass index is 39.48 kg/m.  Physical Exam Constitutional:      General: She is not in acute distress.    Appearance: Normal appearance.  HENT:     Head: Normocephalic and atraumatic.  Pulmonary:     Effort: Pulmonary effort is normal. No respiratory distress.  Abdominal:     General: There is no distension.     Palpations: Abdomen is soft.  Skin:    General: Skin is warm and dry.     Coloration: Skin is not jaundiced.     Findings: No rash.  Neurological:     General: No focal deficit present.     Mental Status: She is alert and oriented to person, place, and time.  Psychiatric:        Mood and Affect: Mood normal.        Behavior: Behavior normal.      Labs and Microbiology:  CBC Latest Ref Rng & Units 05/04/2020 03/26/2020 09/21/2019  WBC 3.8 - 10.8 Thousand/uL 5.6 8.0 7.2  Hemoglobin 11.7 - 15.5 g/dL 13.4 13.2 12.9  Hematocrit 35.0 - 45.0 % 38.8 38.7 37.8  Platelets 140 - 400 Thousand/uL 354 302 335   CMP Latest Ref Rng & Units 05/23/2020 05/16/2020 05/09/2020  Glucose 65 - 99 mg/dL - - -  BUN 7 - 25 mg/dL - - -  Creatinine 0.50 - 1.05 mg/dL - - -  Sodium 135 - 146 mmol/L - - -  Potassium 3.5 - 5.3 mmol/L - - -  Chloride 98 - 110 mmol/L - - -  CO2 20 - 32 mmol/L - - -  Calcium 8.6 - 10.4 mg/dL - - -  Total Protein 6.1 - 8.1 g/dL 6.3 6.9 7.0  Total Bilirubin 0.2 - 1.2 mg/dL 0.9 1.4(H) 2.4(H)  Alkaline  Phos 39 - 117 U/L - 134(H) 196(H)  AST 10 - 35 U/L 24 36 136(H)  ALT 6 - 29 U/L 46(H) 112(H) 321(H)     No results found for this or any previous visit (from the past 240 hour(s)).  Imaging: IMPRESSION: 1. Normal biliary tree, gallbladder, and liver. 2. Normal pancreas.   ASSESSMENT & PLAN:    1. Elevated liver enzymes 2.  Positive HSV serology 3.  Recent Covid vaccination  Discussed with patient that her presentation would not be typical for HSV hepatitis which causes more of a fulminant liver failure type picture.  Additionally she is not someone with advanced immunosuppression or at risk for this type of infection.  I would not have expected her to have near normalization of her LFTs without antiviral therapy if this were the case.  Discussed that her HSV antibodies were indicative of prior exposure to HSV 1 but unlikely to be of diagnostic value for an acute infection as these may persist for many years after exposure.  Discussed that her symptoms seemed potentially consistent with a transient biliary stone that may have passed.  Also discussed that she may have had some sort of immune mediated response to her Covid vaccine, however, there is really no way to tell this for sure.  I advised her to continue to follow-up with her primary doctor and gastroenterology as scheduled.  I believe she can stop her acyclovir and follow-up with [place] as needed.  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

## 2020-05-29 DIAGNOSIS — G4733 Obstructive sleep apnea (adult) (pediatric): Secondary | ICD-10-CM

## 2020-05-30 ENCOUNTER — Ambulatory Visit: Payer: BC Managed Care – PPO | Admitting: Internal Medicine

## 2020-05-31 DIAGNOSIS — G4733 Obstructive sleep apnea (adult) (pediatric): Secondary | ICD-10-CM | POA: Diagnosis not present

## 2020-05-31 NOTE — Procedures (Signed)
Patient Name: Morgan Duran, Morgan Duran Date: 05/22/2020 Gender: Female D.O.B: Apr 22, 1965 Age (years): 13 Referring Provider: Baird Lyons MD, ABSM Height (inches): 64 Interpreting Physician: Baird Lyons MD, ABSM Weight (lbs): 220 RPSGT: Laren Everts BMI: 29 MRN: [id number] Neck Size: 15.25  CLINICAL INFORMATION Sleep Study Type: NPSG Indication for sleep study: Diabetes, Excessive Daytime Sleepiness, Fatigue, Morning Headaches, Obesity, OSA Epworth Sleepiness Score: 14  Most recent polysomnogram dated 03/25/2020 revealed an AHI of 4/h.  SLEEP STUDY TECHNIQUE As per the AASM Manual for the Scoring of Sleep and Associated Events v2.3 (April 2016) with a hypopnea requiring 4% desaturations.  The channels recorded and monitored were frontal, central and occipital EEG, electrooculogram (EOG), submentalis EMG (chin), nasal and oral airflow, thoracic and abdominal wall motion, anterior tibialis EMG, snore microphone, electrocardiogram, and pulse oximetry.  MEDICATIONS Medications self-administered by patient taken the night of the study : MELATONIN, HYDROXYZINE, [place] The study was initiated at 10:05:00 PM and ended at 5:09:06 AM.  Sleep onset time was 13.2 minutes and the sleep efficiency was 56.1%%. The total sleep time was 238 minutes.  Stage REM latency was 141.5 minutes.  The patient spent 6.9%% of the night in stage N1 sleep, 75.4%% in stage N2 sleep, 0.0%% in stage N3 and 17.7% in REM.  Alpha intrusion was absent.  Supine sleep was 23.11%.  RESPIRATORY PARAMETERS The overall apnea/hypopnea index (AHI) was 8.1 per hour. There were 0 total apneas, including 0 obstructive, 0 central and 0 mixed apneas. There were 32 hypopneas and 23 RERAs.  The AHI during Stage REM sleep was 21.4 per hour.  AHI while supine was 30.5 per hour.  The mean oxygen saturation was 91.3%. The minimum SpO2 during sleep was 86.0%.  soft snoring was noted during this  study.  CARDIAC DATA The 2 lead EKG demonstrated sinus rhythm. The mean heart rate was 71.2 beats per minute. Other EKG findings include: None.  LEG MOVEMENT DATA The total PLMS were 0 with a resulting PLMS index of 0.0. Associated arousal with leg movement index was 0.0 .  IMPRESSIONS - Mild obstructive sleep apnea occurred during this study (AHI = 8.1/h). - Insufficient early sleep and events to meet Split CPAP titration protocol requirement. - No significant central sleep apnea occurred during this study (CAI = 0.0/h). - Mild oxygen desaturation was noted during this study (Min O2 = 86.0%). Mean O2 saturation 92.4%. - The patient snored with soft snoring volume. - No cardiac abnormalities were noted during this study. - Clinically significant periodic limb movements did not occur during sleep. No significant associated arousals.  DIAGNOSIS - Obstructive Sleep Apnea (G47.33)  RECOMMENDATIONS - Treatment for mild OSA is directed at symptoms. Conservative measures may include observation, weight loss and sleep position  off back. Other options, including CPAP, a fitted oral appliance, or ENT evaluation, would be based on clinical judgment. - Be careful with alcohol, sedatives and other CNS depressants that may worsen sleep apnea and disrupt normal sleep architecture. - Sleep hygiene should be reviewed to assess factors that may improve sleep quality. - Weight management and regular exercise should be initiated or continued if appropriate.  [Electronically signed] 05/31/2020 12:22 PM  Baird Lyons MD, [place], American Board of Sleep Medicine   NPI: [id number]                          [place], [place] [facility]  ELECTRONICALLY SIGNED ON:  05/31/2020,  12:18 PM [facility] PH: [phone]   FX: [phone] [place]

## 2020-06-06 NOTE — Telephone Encounter (Signed)
Patient is returning phone call. Patient phone number is [phone]. May leave detailed message on voicemail and mychart.

## 2020-06-06 NOTE — Telephone Encounter (Signed)
Patient is returning phone call. Patient phone number is [phone].

## 2020-06-06 NOTE — Telephone Encounter (Signed)
I have called and LM on VM for the pt to call back to discuss results.

## 2020-06-06 NOTE — Telephone Encounter (Signed)
Her sleep study showed mild obstructive sleep apnea, averaging 8 apneas/ hour with drops in blood oxygen level.  Options for treating this include  1) replacing her old CPAP- this may take a few months due to supply chain problems, but we can place order if that's what she wants to do. 2) We can wait to discuss options in person when I see her next, if she would rather do that. Ok to get her an appointment with me earlier than scheduled if she wants.

## 2020-06-06 NOTE — Telephone Encounter (Signed)
Dr Annamaria Boots, have you had a chance to review her study yet? Thanks!

## 2020-06-06 NOTE — Telephone Encounter (Signed)
Left message for patient to call back  

## 2020-06-06 NOTE — Telephone Encounter (Signed)
Responding to pt via mychart per her request- see mychart msg dated 06/06/20

## 2020-06-27 DIAGNOSIS — M25521 Pain in right elbow: Secondary | ICD-10-CM | POA: Diagnosis not present

## 2020-06-27 DIAGNOSIS — M25522 Pain in left elbow: Secondary | ICD-10-CM | POA: Diagnosis not present

## 2020-06-27 DIAGNOSIS — M1711 Unilateral primary osteoarthritis, right knee: Secondary | ICD-10-CM | POA: Diagnosis not present

## 2020-06-30 NOTE — Telephone Encounter (Signed)
Please order DME current or new- replace old CPAP machine auto 4-15, mask of choice, humidifier, supplies,  Airview/ card  We will need to see for ROV 31-90 days after she gets her machine per insurance rules, so she will need to help with communication about that.

## 2020-06-30 NOTE — Telephone Encounter (Signed)
Please see mychart message from patient  Hi Dr. Annamaria Boots, Yes, I would like to replace my old CPAP machine with a New one. While I was at the Sleep Study I was shown a machine that auto set to the air levels needed. I thought that machine would be best. I would like to have my next appointment moved to when the machine comes in . Thank you for your help, Morgan Duran

## 2020-07-11 ENCOUNTER — Ambulatory Visit: Payer: BC Managed Care – PPO | Admitting: Internal Medicine

## 2020-08-25 ENCOUNTER — Other Ambulatory Visit: Payer: Self-pay

## 2020-08-25 DIAGNOSIS — G43009 Migraine without aura, not intractable, without status migrainosus: Secondary | ICD-10-CM

## 2020-08-25 MED ORDER — RIZATRIPTAN BENZOATE 10 MG PO TABS: 10.0000 mg | ORAL_TABLET | ORAL | 3 refills | Status: DC | PRN

## 2020-09-24 ENCOUNTER — Other Ambulatory Visit: Payer: Self-pay

## 2020-09-24 DIAGNOSIS — G43009 Migraine without aura, not intractable, without status migrainosus: Secondary | ICD-10-CM

## 2020-09-24 MED ORDER — RIZATRIPTAN BENZOATE 10 MG PO TABS: 10.0000 mg | ORAL_TABLET | ORAL | 3 refills | Status: DC | PRN

## 2020-09-26 ENCOUNTER — Ambulatory Visit: Payer: BC Managed Care – PPO | Admitting: Osteopathic Medicine

## 2020-10-02 ENCOUNTER — Encounter: Payer: Self-pay | Admitting: Osteopathic Medicine

## 2020-10-02 ENCOUNTER — Telehealth (INDEPENDENT_AMBULATORY_CARE_PROVIDER_SITE_OTHER): Payer: BC Managed Care – PPO | Admitting: Osteopathic Medicine

## 2020-10-02 DIAGNOSIS — E1169 Type 2 diabetes mellitus with other specified complication: Secondary | ICD-10-CM

## 2020-10-02 DIAGNOSIS — U071 COVID-19: Secondary | ICD-10-CM

## 2020-10-02 DIAGNOSIS — R110 Nausea: Secondary | ICD-10-CM | POA: Diagnosis not present

## 2020-10-02 DIAGNOSIS — R0981 Nasal congestion: Secondary | ICD-10-CM

## 2020-10-02 DIAGNOSIS — G4733 Obstructive sleep apnea (adult) (pediatric): Secondary | ICD-10-CM

## 2020-10-02 DIAGNOSIS — E785 Hyperlipidemia, unspecified: Secondary | ICD-10-CM

## 2020-10-02 DIAGNOSIS — Z9989 Dependence on other enabling machines and devices: Secondary | ICD-10-CM

## 2020-10-02 MED ORDER — NIRMATRELVIR/RITONAVIR (PAXLOVID)TABLET: 3.0000 | ORAL_TABLET | Freq: Two times a day (BID) | ORAL | 0 refills | Status: AC

## 2020-10-02 MED ORDER — PROMETHAZINE HCL 25 MG PO TABS: 25.0000 mg | ORAL_TABLET | Freq: Four times a day (QID) | ORAL | 2 refills | Status: DC | PRN

## 2020-10-02 MED ORDER — ALBUTEROL SULFATE HFA 108 (90 BASE) MCG/ACT IN AERS: 2.0000 | INHALATION_SPRAY | Freq: Four times a day (QID) | RESPIRATORY_TRACT | 11 refills | Status: DC | PRN

## 2020-10-02 MED ORDER — GUAIFENESIN-CODEINE 100-10 MG/5ML PO SYRP: 5.0000 mL | ORAL_SOLUTION | Freq: Four times a day (QID) | ORAL | 0 refills | Status: DC | PRN

## 2020-10-02 NOTE — Patient Instructions (Addendum)
Meds sent to pharmacy:  Meds ordered this encounter  Medications   albuterol (VENTOLIN HFA) 108 (90 Base) MCG/ACT inhaler    Sig: Inhale 2 puffs into the lungs every 6 (six) hours as needed for wheezing.    Dispense:  2 each    Refill:  11   promethazine (PHENERGAN) 25 MG tablet    Sig: Take 1 tablet (25 mg total) by mouth every 6 (six) hours as needed for nausea or vomiting - can also use to help migraine.    Dispense:  30 tablet    Refill:  2   nirmatrelvir/ritonavir EUA (PAXLOVID) TABS antiviral for COVID    Sig: Take 3 tablets by mouth 2 (two) times daily for 5 days. (Take nirmatrelvir 150 mg two tablets twice daily for 5 days and ritonavir 100 mg one tablet twice daily for 5 days)    Dispense:  30 tablet    Refill:  0   guaiFENesin-codeine (ROBITUSSIN AC) 100-10 MG/5ML syrup    Sig: Take 5-10 mLs by mouth 4 (four) times daily as needed for cough or congestion.    Dispense:  180 mL    Refill:  0   Can add antihistamine / decongestant (Claritin-D or Sudafed or similar) Can add Tylenol    Since you're vaccinated - stay isolated for at least 5 days from positive test, and for 5 days after that wear a well-fitted mask anywhere you go, do not socialize without a mask on!   Close contacts should test themselves 3-5 days after last exposure to you.   Can probably defer booster shot for a few weeks, but I still encourage folks to get boosted even if they've been sick!   Would recommend pneumonia vaccine at your next office visit given your diabetes history.      Medications & Home Remedies for Upper Respiratory Illness   Note: the following list assumes no pregnancy, normal liver & kidney function and no other drug interactions. Dr. Sheppard Coil has highlighted medications which are safe for you to use, but these may not be appropriate for everyone. Always ask a pharmacist or qualified medical provider if you have any questions!    Aches/Pains, Fever, Headache OTC Acetaminophen  (Tylenol) 500 mg tablets - take max 2 tablets (1000 mg) every 6 hours (4 times per day)  OTC Ibuprofen (Motrin) 200 mg tablets - take max 4 tablets (800 mg) every 6 hours*   Sinus Congestion OTC Nasal Saline if desired to rinse OTC Oxymetolazone (Afrin, others) sparing use due to rebound congestion, NEVER use in kids OTC Phenylephrine (Sudafed) 10 mg tablets every 4 hours (or the 12-hour formulation)* OTC Diphenhydramine (Benadryl) 25 mg tablets - take max 2 tablets every 4 hours   Cough & Sore Throat Prescription cough pills or syrups as directed OTC Dextromethorphan (Robitussin, others) - cough suppressant OTC Guaifenesin (Robitussin, Mucinex, others) - expectorant (helps cough up mucus) (Dextromethorphan and Guaifenesin also come in a combination tablet/syrup) OTC Lozenges w/ Benzocaine + Menthol (Cepacol) Honey - as much as you want! Teas which "coat the throat" - look for ingredients Elm Bark, Licorice Root, Marshmallow Root   Other  OTC Zinc Lozenges within 24 hours of symptoms onset - mixed evidence this shortens the duration of the common cold Don't waste your money on Vitamin C or Echinacea in acute illness - it's already too late!    *Caution in patients with high blood pressure

## 2020-10-02 NOTE — Progress Notes (Signed)
Telemedicine Visit via  Audio only - telephone (patient preference /  technical difficulty with MyChart video application)  I connected with Morgan Duran on 10/02/20 at 7:09 AM  by phone or  telemedicine application as noted above  I verified that I am speaking with or regarding  the correct patient using two identifiers.  Participants: Myself, Dr Emeterio Reeve DO Patient: Morgan Duran Patient proxy if applicable: none Other, if applicable: none  Patient is at home I am in office at [facility]    I discussed the limitations of evaluation and management  by telemedicine and the availability of in person appointments.  The participant(s) above expressed understanding and  agreed to proceed with this appointment via telemedicine.       History of Present Illness: Morgan Duran is a 55 y.o. 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 BP Readings from Last 3 Encounters:  05/27/20 132/82  05/23/20 120/84  05/08/20 128/84   Exam: Normal Speech.  NAD  Lab and Radiology Results No results found for this or any previous visit (from the past 72 hour(s)). No results found.     Assessment and Plan: 55 y.o. female with The primary encounter diagnosis was COVID-19. Diagnoses of Nausea, Sinus congestion, OSA on CPAP, Type 2 diabetes mellitus  with other specified complication, without long-term current use of insulin ([place]), and Dyslipidemia associated with type 2 diabetes mellitus ([place]) were also pertinent to this visit.   PDMP not reviewed this encounter. No orders of the defined types were placed in this encounter.   Patient Instructions  Meds sent to pharmacy:  Meds ordered this encounter  Medications   albuterol (VENTOLIN HFA) 108 (90 Base) MCG/ACT inhaler    Sig: Inhale 2 puffs into the lungs every 6 (six) hours as needed for wheezing.    Dispense:  2 each    Refill:  11   promethazine (PHENERGAN) 25 MG tablet    Sig: Take 1 tablet (25 mg total) by mouth every 6 (six) hours as needed for nausea or vomiting - can also use to help migraine.    Dispense:  30 tablet    Refill:  2   nirmatrelvir/ritonavir EUA (PAXLOVID) TABS antiviral for COVID    Sig: Take 3 tablets by mouth 2 (two) times daily for 5 days. (Take nirmatrelvir 150 mg two tablets twice daily for 5 days and ritonavir 100 mg one tablet twice daily for 5 days)    Dispense:  30 tablet    Refill:  0   guaiFENesin-codeine (ROBITUSSIN AC) 100-10 MG/5ML syrup    Sig: Take 5-10 mLs by mouth 4 (four) times daily as needed for cough or congestion.    Dispense:  180 mL    Refill:  0   Can add antihistamine / decongestant (Claritin-D or Sudafed or similar) Can add Tylenol    Since you're vaccinated - stay isolated for at least 5 days  from positive test, and for 5 days after that wear a well-fitted mask anywhere you go, do not socialize without a mask on!   Close contacts should test themselves 3-5 days after last exposure to you.   Can probably defer booster shot for a few weeks, but I still encourage folks to get boosted even if they've been sick!     Medications & Home Remedies for Upper Respiratory Illness   Note: the following list assumes no pregnancy, normal liver & kidney function and no other drug interactions. Dr. Sheppard Coil has highlighted medications  which are safe for you to use, but these may not be appropriate for everyone. Always ask a pharmacist or qualified medical provider if you have any questions!    Aches/Pains, Fever, Headache OTC Acetaminophen (Tylenol) 500 mg tablets - take max 2 tablets (1000 mg) every 6 hours (4 times per day)  OTC Ibuprofen (Motrin) 200 mg tablets - take max 4 tablets (800 mg) every 6 hours*   Sinus Congestion OTC Nasal Saline if desired to rinse OTC Oxymetolazone (Afrin, others) sparing use due to rebound congestion, NEVER use in kids OTC Phenylephrine (Sudafed) 10 mg tablets every 4 hours (or the 12-hour formulation)* OTC Diphenhydramine (Benadryl) 25 mg tablets - take max 2 tablets every 4 hours   Cough & Sore Throat Prescription cough pills or syrups as directed OTC Dextromethorphan (Robitussin, others) - cough suppressant OTC Guaifenesin (Robitussin, Mucinex, others) - expectorant (helps cough up mucus) (Dextromethorphan and Guaifenesin also come in a combination tablet/syrup) OTC Lozenges w/ Benzocaine + Menthol (Cepacol) Honey - as much as you want! Teas which "coat the throat" - look for ingredients Elm Bark, Licorice Root, Marshmallow Root   Other  OTC Zinc Lozenges within 24 hours of symptoms onset - mixed evidence this shortens the duration of the common cold Don't waste your money on Vitamin C or Echinacea in acute illness - it's already too late!    *Caution in patients with high blood pressure     Instructions sent via MyChart.   Follow Up Instructions: Return if symptoms worsen or fail to improve.    I discussed the assessment and treatment plan with the patient. The patient was provided an opportunity to ask questions and all were answered. The patient agreed with the plan and demonstrated an understanding of the instructions.   The patient was advised to call back or seek an in-person evaluation if any new concerns, if symptoms worsen or if the condition fails to improve as  anticipated.  21 minutes of non-face-to-face time was provided during this encounter.      . . . . . . . . . . . . . Marland Kitchen                   Historical information moved to improve visibility of documentation.  Past Medical History:  Diagnosis Date   Anxiety    Arthritis    Benign cyst of left breast 04/09/2019   Diabetes mellitus without complication (HCC)    type 2   Heart murmur    at birth, no problems   Hyperlipidemia    Hypertension    Migraine    Morbid obesity ([place])    OSA on CPAP    Sleep apnea    uses cpap nightly   Past Surgical History:  Procedure Laterality Date   COLONOSCOPY  2003   [place] - Normal   KNEE ARTHROSCOPY Right    NASAL SEPTUM SURGERY  SHOULDER ARTHROSCOPY Left    TONSILLECTOMY     UPPER GASTROINTESTINAL ENDOSCOPY  2003   in [place] - Normal   Social History   Tobacco Use   Smoking status: Never   Smokeless tobacco: Never  Substance Use Topics   Alcohol use: Not Currently   family history includes Diabetes in her father; Emphysema in her mother.  Medications: Current Outpatient Medications  Medication Sig Dispense Refill   glucose blood (ONE TOUCH ULTRA TEST) test strip USE AS INSTRUCTED 100 each 1   hydrOXYzine (ATARAX/VISTARIL) 25 MG tablet Take 1 tablet (25 mg total) by mouth at bedtime. 30 tablet 0   Multiple Vitamins-Minerals (MULTIVITAMIN WOMEN PO) Take by mouth.     Omega-3 Fatty Acids (FISH OIL PO) Take by mouth daily.     rizatriptan (MAXALT) 10 MG tablet Take 1 tablet (10 mg total) by mouth as needed for migraine. May repeat one dose in 2 hours if needed. Max 20 mg/day. Limit use to 2 days per week or less 18 tablet 3   albuterol (VENTOLIN HFA) 108 (90 Base) MCG/ACT inhaler Inhale 2 puffs into the lungs every 6 (six) hours as needed for wheezing. 2 each 11   fexofenadine (ALLEGRA) 60 MG tablet Take 2 tablets (120 mg total) by mouth 2 (two) times daily. (Patient not taking: No sig reported) 40 tablet  0   guaiFENesin-codeine (ROBITUSSIN AC) 100-10 MG/5ML syrup Take 5-10 mLs by mouth 4 (four) times daily as needed for cough or congestion. 180 mL 0   meloxicam (MOBIC) 7.5 MG tablet Take 7.5 mg by mouth 2 (two) times daily as needed. (Patient not taking: No sig reported)     metFORMIN (GLUCOPHAGE XR) 500 MG 24 hr tablet Take 1 tablet (500 mg total) by mouth daily with breakfast. (Patient not taking: No sig reported) 90 tablet 3   nirmatrelvir/ritonavir EUA (PAXLOVID) TABS Take 3 tablets by mouth 2 (two) times daily for 5 days. (Take nirmatrelvir 150 mg two tablets twice daily for 5 days and ritonavir 100 mg one tablet twice daily for 5 days) 30 tablet 0   promethazine (PHENERGAN) 25 MG tablet Take 1 tablet (25 mg total) by mouth every 6 (six) hours as needed for nausea or vomiting. 30 tablet 2   No current facility-administered medications for this visit.   No Known Allergies   If phone visit, billing and coding can please add appropriate modifier if needed

## 2020-10-03 ENCOUNTER — Encounter: Payer: Self-pay | Admitting: Osteopathic Medicine

## 2020-10-10 DIAGNOSIS — M7702 Medial epicondylitis, left elbow: Secondary | ICD-10-CM | POA: Diagnosis not present

## 2020-10-10 DIAGNOSIS — M1711 Unilateral primary osteoarthritis, right knee: Secondary | ICD-10-CM | POA: Diagnosis not present

## 2020-10-10 DIAGNOSIS — M7701 Medial epicondylitis, right elbow: Secondary | ICD-10-CM | POA: Diagnosis not present

## 2020-10-11 ENCOUNTER — Emergency Department
Admission: EM | Admit: 2020-10-11 | Discharge: 2020-10-11 | Discharge status: Absent without leave | Payer: BC Managed Care – PPO | Source: Home / Self Care

## 2020-10-17 DIAGNOSIS — G4733 Obstructive sleep apnea (adult) (pediatric): Secondary | ICD-10-CM | POA: Diagnosis not present

## 2020-11-17 DIAGNOSIS — G4733 Obstructive sleep apnea (adult) (pediatric): Secondary | ICD-10-CM | POA: Diagnosis not present

## 2020-12-03 ENCOUNTER — Encounter: Payer: Self-pay | Admitting: Family Medicine

## 2020-12-16 ENCOUNTER — Ambulatory Visit: Payer: BC Managed Care – PPO | Admitting: Osteopathic Medicine

## 2020-12-16 DIAGNOSIS — M25522 Pain in left elbow: Secondary | ICD-10-CM | POA: Diagnosis not present

## 2020-12-16 DIAGNOSIS — M25552 Pain in left hip: Secondary | ICD-10-CM | POA: Diagnosis not present

## 2020-12-16 DIAGNOSIS — M542 Cervicalgia: Secondary | ICD-10-CM | POA: Diagnosis not present

## 2020-12-16 DIAGNOSIS — M25559 Pain in unspecified hip: Secondary | ICD-10-CM | POA: Diagnosis not present

## 2020-12-17 ENCOUNTER — Encounter: Payer: Self-pay | Admitting: Internal Medicine

## 2020-12-18 DIAGNOSIS — G4733 Obstructive sleep apnea (adult) (pediatric): Secondary | ICD-10-CM | POA: Diagnosis not present

## 2020-12-18 NOTE — Progress Notes (Signed)
02/05/20- 63 yoF never smoker for sleep evaluation courtesy of Emeterio Reeve, DO to update CPAP. Medical problem list includes Migraine, HTN, Aortic Stenosis, OSA, DM2/ polyneuropathy, Dyslipidemia, Degenerative Joint Disease, Morbid Obesity, Anxiety,  Initial dx OSA 2007 for snoring and EDS. CPAP has helped.  Married mother of 2 grown children and working as nanny/ housework Current CPAP machine is old- 2012. This year she has been noting more daytime tiredness. Using 2-3 monster/ energy drinks/ day. Wants to get off these.  Knee pain and nocturia wake her 3-4x/ night.  Denies parasomnias.  Hs lost 70 lbs with diet in last few years.  ENT surgery+tonsils as child, septoplasty. 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  Epworth score- 7 CPAP download- Body weight today- 219 lbs Covid vax- J&J Flu vax- had   12/19/20- 55 yoF never smoker followed for OSA, complicated by Migraine, HTN, Aortic Stenosis, OSA, DM2/ polyneuropathy, Dyslipidemia, Degenerative Joint Disease, Morbid Obesity, Anxiety, Covid infection June 2022,  HST 03/25/20- AHI 4/ hr, desaturation to 88%, body weight 219 lbs NPSG 05/22/20- AHI 8.1/ hr, desaturation to 86%, body weight 220 lbs CPAP auto 4-15/ [facility]- replacement ordered 06/30/20 Download- compliance 100%, AHI 1.6/ hr Body weight today- Covid vax-1 J&J, 2 Moderna Very pleased with her replacement. Sleeps better, feels better. No concerns. Reviewed download.  ROS-see HPI  + = positive Constitutional:    weight loss, night sweats, fevers, chills, +fatigue, lassitude. HEENT:   + headaches, difficulty swallowing, tooth/dental problems, sore throat,       sneezing, itching, ear ache, nasal congestion, post nasal drip, snoring CV:    chest pain, orthopnea, PND, swelling in lower extremities, anasarca,                                   dizziness, +palpitations Resp:   shortness of breath with exertion or at rest.                productive cough,   non-productive cough,  coughing up of blood.              change in color of mucus.  wheezing.   Skin:    rash or lesions. GI:  No-   heartburn, indigestion, abdominal pain, nausea, vomiting, diarrhea,                 change in bowel habits, loss of appetite GU: dysuria, change in color of urine, no urgency or frequency.   flank pain. MS:   +joint pain, stiffness, decreased range of motion, back pain. Neuro-     nothing unusual Psych:  change in mood or affect.  depression or anxiety.   memory loss.  OBJ- Physical Exam General- Alert, Oriented, Affect-appropriate, Distress- none acute, + obese Skin- rash-none, lesions- none, excoriation- none Lymphadenopathy- none Head- atraumatic            Eyes- Gross vision intact, PERRLA, conjunctivae and secretions clear            Ears- Hearing, canals-normal            Nose- Clear, no-Septal dev, mucus, polyps, erosion, perforation             Throat- Mallampati III-IV , mucosa clear , drainage- none, tonsils-absent, + teeth Neck- flexible , trachea midline, no stridor , thyroid nl, carotid no bruit Chest - symmetrical excursion , unlabored           Heart/CV- RRR ,  no murmur heard , no gallop  , no rub, nl s1 s2                           - JVD- none , edema- none, stasis changes- none, varices- none           Lung- clear to P&A, wheeze- none, cough- none , dullness-none, rub- none           Chest wall-  Abd-  Br/ Gen/ Rectal- Not done, not indicated Extrem- cyanosis- none, clubbing, none, atrophy- none, strength- nl Neuro- grossly intact to observation

## 2020-12-19 ENCOUNTER — Encounter: Payer: Self-pay | Admitting: Internal Medicine

## 2020-12-19 ENCOUNTER — Other Ambulatory Visit: Payer: Self-pay

## 2020-12-19 ENCOUNTER — Ambulatory Visit (INDEPENDENT_AMBULATORY_CARE_PROVIDER_SITE_OTHER): Payer: BC Managed Care – PPO | Admitting: Internal Medicine

## 2020-12-19 DIAGNOSIS — Z6841 Body Mass Index (BMI) 40.0 and over, adult: Secondary | ICD-10-CM | POA: Diagnosis not present

## 2020-12-19 DIAGNOSIS — G4733 Obstructive sleep apnea (adult) (pediatric): Secondary | ICD-10-CM | POA: Diagnosis not present

## 2020-12-19 DIAGNOSIS — Z9989 Dependence on other enabling machines and devices: Secondary | ICD-10-CM

## 2020-12-19 NOTE — Patient Instructions (Signed)
We can continue CPAP auto 4-15  Please call if we can help

## 2020-12-19 NOTE — Assessment & Plan Note (Signed)
Benefits from CPAP with good compliance and control. Pleased with [employer]. Plan- continue CPAP auto 4-15

## 2020-12-19 NOTE — Assessment & Plan Note (Signed)
Encourage diet/ exercise. Consider referral to [facility].

## 2021-01-17 DIAGNOSIS — G4733 Obstructive sleep apnea (adult) (pediatric): Secondary | ICD-10-CM | POA: Diagnosis not present

## 2021-01-22 DIAGNOSIS — M25561 Pain in right knee: Secondary | ICD-10-CM | POA: Diagnosis not present

## 2021-01-22 DIAGNOSIS — M25562 Pain in left knee: Secondary | ICD-10-CM | POA: Diagnosis not present

## 2021-02-17 DIAGNOSIS — G4733 Obstructive sleep apnea (adult) (pediatric): Secondary | ICD-10-CM | POA: Diagnosis not present

## 2021-03-19 DIAGNOSIS — G4733 Obstructive sleep apnea (adult) (pediatric): Secondary | ICD-10-CM | POA: Diagnosis not present

## 2021-04-17 DIAGNOSIS — E119 Type 2 diabetes mellitus without complications: Secondary | ICD-10-CM | POA: Diagnosis not present

## 2021-04-17 DIAGNOSIS — H35363 Drusen (degenerative) of macula, bilateral: Secondary | ICD-10-CM | POA: Diagnosis not present

## 2021-04-19 DIAGNOSIS — G4733 Obstructive sleep apnea (adult) (pediatric): Secondary | ICD-10-CM | POA: Diagnosis not present

## 2021-05-07 DIAGNOSIS — M170 Bilateral primary osteoarthritis of knee: Secondary | ICD-10-CM | POA: Diagnosis not present

## 2021-05-20 DIAGNOSIS — G4733 Obstructive sleep apnea (adult) (pediatric): Secondary | ICD-10-CM | POA: Diagnosis not present

## 2021-06-17 DIAGNOSIS — G4733 Obstructive sleep apnea (adult) (pediatric): Secondary | ICD-10-CM | POA: Diagnosis not present

## 2021-06-30 ENCOUNTER — Other Ambulatory Visit: Payer: Self-pay

## 2021-06-30 ENCOUNTER — Emergency Department (INDEPENDENT_AMBULATORY_CARE_PROVIDER_SITE_OTHER)
Admission: EM | Admit: 2021-06-30 | Discharge: 2021-06-30 | Disposition: A | Discharge status: Home / Self Care | Payer: BC Managed Care – PPO | Source: Home / Self Care | Attending: Family Medicine | Admitting: Family Medicine

## 2021-06-30 DIAGNOSIS — G43919 Migraine, unspecified, intractable, without status migrainosus: Secondary | ICD-10-CM | POA: Diagnosis not present

## 2021-06-30 DIAGNOSIS — B349 Viral infection, unspecified: Secondary | ICD-10-CM

## 2021-06-30 MED ORDER — ONDANSETRON 8 MG PO TBDP
8.0000 mg | ORAL_TABLET | Freq: Once | ORAL | Status: AC
  Administered 2021-06-30: 8 mg via ORAL

## 2021-06-30 MED ORDER — ONDANSETRON 8 MG PO TBDP: 8.0000 mg | ORAL_TABLET | Freq: Three times a day (TID) | ORAL | 0 refills | Status: DC | PRN

## 2021-06-30 MED ORDER — KETOROLAC TROMETHAMINE 60 MG/2ML IM SOLN
60.0000 mg | Freq: Once | INTRAMUSCULAR | Status: AC
  Administered 2021-06-30: 60 mg via INTRAMUSCULAR

## 2021-06-30 NOTE — ED Provider Notes (Signed)
?[facility] ? ? ? ?CSN: [id number] ?Arrival date & time: 06/30/21  1927 ? ? ?  ? ?History   ?Chief Complaint ?Chief Complaint  ?Patient presents with  ? Fatigue  ? Cough  ? ? ?HPI ?Morgan Duran is a 56 y.o. female.  ? ?HPI ? ?Patient states she currently has a migraine headache.  Severe headache with vomiting.  She states that she started off with fatigue cough and cold on Saturday.  Her home COVID test on Sunday was negative.  She states that the headache, vomiting, and worsening fatigue came on today.  Her cough is improving.  She does not have any fever or chills.  She does not think she has COVID.  He has been staying home from work. ? ?Past Medical History:  ?Diagnosis Date  ? Anxiety   ? Arthritis   ? Benign cyst of left breast 04/09/2019  ? Diabetes mellitus without complication ([place])   ? type 2  ? Heart murmur   ? at birth, no problems  ? Hyperlipidemia   ? Hypertension   ? Migraine   ? Morbid obesity ([place])   ? OSA on CPAP   ? Sleep apnea   ? uses cpap nightly  ? ? ?Patient Active Problem List  ? Diagnosis Date Noted  ? Benign cyst of left breast 04/09/2019  ? Statin declined 12/24/2018  ? History of osteoporosis 12/24/2018  ? Colon cancer screening 05/29/2018  ? Diabetic eye exam ([place]) 05/29/2018  ? Diabetic nephropathy associated with type 2 diabetes mellitus ([place]) 05/26/2018  ? Diabetic polyneuropathy associated with type 2 diabetes mellitus ([place]) 05/26/2018  ? Noncompliance with treatment plan 10/13/2017  ? Perimenopausal menorrhagia 10/13/2017  ? Migraine without aura and without status migrainosus, not intractable 09/30/2017  ? Class 3 severe obesity due to excess calories with serious comorbidity and body mass index (BMI) of 40.0 to 44.9 in adult [facility]) 04/03/2017  ? Hypertension associated with diabetes ([place]) 04/03/2017  ? DJD (degenerative joint disease) 03/23/2017  ? Aortic stenosis, mild 03/23/2017  ? Dyslipidemia associated with type 2 diabetes mellitus ([place]) 01/21/2017  ? OSA on CPAP  01/15/2017  ? Systolic murmur 66/44/0347  ? Metrorrhagia 05/31/2016  ? ? ?Past Surgical History:  ?Procedure Laterality Date  ? COLONOSCOPY  2003  ? [place] - Normal  ? KNEE ARTHROSCOPY Right   ? NASAL SEPTUM SURGERY    ? SHOULDER ARTHROSCOPY Left   ? TONSILLECTOMY    ? UPPER GASTROINTESTINAL ENDOSCOPY  2003  ? in [place] - Normal  ? ? ?OB History   ? ? Gravida  ?2  ? Para  ?2  ? Term  ?   ? Preterm  ?   ? AB  ?   ? Living  ?   ?  ? ? SAB  ?   ? IAB  ?   ? Ectopic  ?   ? Multiple  ?   ? Live Births  ?   ?   ?  ?  ? ? ? ?Home Medications   ? ?Prior to Admission medications   ?Medication Sig Start Date End Date Taking? Authorizing Provider  ?ondansetron (ZOFRAN-ODT) 8 MG disintegrating tablet Take 1 tablet (8 mg total) by mouth every 8 (eight) hours as needed for nausea or vomiting. 06/30/21  Yes Raylene Everts, MD  ?albuterol (VENTOLIN HFA) 108 (90 Base) MCG/ACT inhaler Inhale 2 puffs into the lungs every 6 (six) hours as needed for wheezing. 10/02/20   Emeterio Reeve,  DO  ?glucose blood (ONE TOUCH ULTRA TEST) test strip USE AS INSTRUCTED 05/26/18   Trixie Dredge, PA-C  ?meloxicam (MOBIC) 7.5 MG tablet Take 7.5 mg by mouth 2 (two) times daily as needed. 10/20/19   [provider]  ?metFORMIN (GLUCOPHAGE XR) 500 MG 24 hr tablet Take 1 tablet (500 mg total) by mouth daily with breakfast. 03/26/20   Emeterio Reeve, DO  ?Multiple Vitamins-Minerals (MULTIVITAMIN WOMEN PO) Take by mouth.    [provider]  ?Omega-3 Fatty Acids (FISH OIL PO) Take by mouth daily.    [provider]  ?rizatriptan (MAXALT) 10 MG tablet Take 1 tablet (10 mg total) by mouth as needed for migraine. May repeat one dose in 2 hours if needed. Max 20 mg/day. Limit use to 2 days per week or less 09/24/20   Emeterio Reeve, DO  ? ? ?Family History ?Family History  ?Problem Relation Age of Onset  ? Emphysema Mother   ? Diabetes Father   ? Colon polyps Neg Hx   ? Rectal cancer Neg Hx   ? Stomach cancer Neg  Hx   ? ? ?Social History ?Social History  ? ?Tobacco Use  ? Smoking status: Never  ? Smokeless tobacco: Never  ?Vaping Use  ? Vaping Use: Never used  ?Substance Use Topics  ? Alcohol use: Not Currently  ? Drug use: Never  ? ? ? ?Allergies   ?Patient has no known allergies. ? ? ?Review of Systems ?Review of Systems ?See HPI ? ?Physical Exam ?Triage Vital Signs ?ED Triage Vitals  ?Enc Vitals Group  ?   BP 06/30/21 1934 (!) 164/88  ?   Pulse Rate 06/30/21 1934 86  ?   Resp 06/30/21 1934 18  ?   Temp 06/30/21 1934 98 ?F (36.7 ?C)  ?   Temp Source 06/30/21 1934 Oral  ?   SpO2 06/30/21 1934 100 %  ?   Weight --   ?   Height --   ?   Head Circumference --   ?   Peak Flow --   ?   Pain Score 06/30/21 1937 8  ?   Pain Loc --   ?   Pain Edu? --   ?   Excl. in [place]? --   ? ?No data found. ? ?Updated Vital Signs ?BP (!) 164/88 (BP Location: Left Arm)   Pulse 86   Temp 98 ?F (36.7 ?C) (Oral)   Resp 18   SpO2 100%  ? ?   ? ?Physical Exam ?Constitutional:   ?   General: She is not in acute distress. ?   Appearance: She is well-developed. She is obese. She is ill-appearing.  ?HENT:  ?   Head: Normocephalic and atraumatic.  ?   Mouth/Throat:  ?   Comments: mask ?Eyes:  ?   Conjunctiva/sclera: Conjunctivae normal.  ?   Pupils: Pupils are equal, round, and reactive to light.  ?Cardiovascular:  ?   Rate and Rhythm: Normal rate and regular rhythm.  ?   Heart sounds: Normal heart sounds.  ?Pulmonary:  ?   Effort: Pulmonary effort is normal. No respiratory distress.  ?   Breath sounds: Normal breath sounds.  ?Abdominal:  ?   General: There is no distension.  ?   Palpations: Abdomen is soft.  ?Musculoskeletal:     ?   General: Normal range of motion.  ?   Cervical back: Normal range of motion.  ?Skin: ?   General: Skin is warm  and dry.  ?Neurological:  ?   General: No focal deficit present.  ?   Mental Status: She is alert.  ?Psychiatric:     ?   Mood and Affect: Mood normal.     ?   Behavior: Behavior normal.  ? ? ? ?UC Treatments /  Results  ?Labs ?(all labs ordered are listed, but only abnormal results are displayed) ?Labs Reviewed - No data to display ? ?EKG ? ? ?Radiology ?No results found. ? ?Procedures ?Procedures (including critical care time) ? ?Medications Ordered in UC ?Medications  ?ketorolac (TORADOL) injection 60 mg (has no administration in time range)  ?ondansetron (ZOFRAN-ODT) disintegrating tablet 8 mg (has no administration in time range)  ? ? ?Initial Impression / Assessment and Plan / UC Course  ?I have reviewed the triage vital signs and the nursing notes. ? ?Pertinent labs & imaging results that were available during my care of the patient were reviewed by me and considered in my medical decision making (see chart for details). ? ?  ? ?Patient was concerned that her headache was caused by dehydration because she had had vomiting.  Her vital signs are stable and she does not appear to have dehydration.  I think that her illness is triggered a migraine headache and we will treat her accordingly with antiemetics and a shot of Toradol.  I am also going to prescribe Zofran so she has medication at home.  Follow-up with primary care ?Final Clinical Impressions(s) / UC Diagnoses  ? ?Final diagnoses:  ?Intractable migraine without status migrainosus, unspecified migraine type  ?Viral infection  ? ? ? ?Discharge Instructions   ? ?  ?Home to rest ?Drink lots of fluids ?I have prescribed Zofran (ondansetron) for vomiting. ?Schedule an appointment for primary care follow-up with Samuel Bouche as soon as you are feeling better ? ? ?ED Prescriptions   ? ? Medication Sig Dispense Auth. Provider  ? ondansetron (ZOFRAN-ODT) 8 MG disintegrating tablet Take 1 tablet (8 mg total) by mouth every 8 (eight) hours as needed for nausea or vomiting. 20 tablet Raylene Everts, MD  ? ?  ? ?PDMP not reviewed this encounter. ?  ?Raylene Everts, MD ?06/30/21 1958 ? ?

## 2021-06-30 NOTE — ED Triage Notes (Signed)
Pt presents with fatigue and cough that began on Saturday. Pt states she did a home covid test that was negative. Pt stated she had emesis, poor appetite, and HA that began today. ?

## 2021-06-30 NOTE — Discharge Instructions (Signed)
Home to rest ?Drink lots of fluids ?I have prescribed Zofran (ondansetron) for vomiting. ?Schedule an appointment for primary care follow-up with Samuel Bouche as soon as you are feeling better ?

## 2021-07-18 DIAGNOSIS — G4733 Obstructive sleep apnea (adult) (pediatric): Secondary | ICD-10-CM | POA: Diagnosis not present

## 2021-08-07 DIAGNOSIS — M7671 Peroneal tendinitis, right leg: Secondary | ICD-10-CM | POA: Diagnosis not present

## 2021-08-07 DIAGNOSIS — M7672 Peroneal tendinitis, left leg: Secondary | ICD-10-CM | POA: Diagnosis not present

## 2021-08-20 DIAGNOSIS — M170 Bilateral primary osteoarthritis of knee: Secondary | ICD-10-CM | POA: Diagnosis not present

## 2021-08-30 ENCOUNTER — Emergency Department (INDEPENDENT_AMBULATORY_CARE_PROVIDER_SITE_OTHER)
Admission: EM | Admit: 2021-08-30 | Discharge: 2021-08-30 | Disposition: A | Discharge status: Home / Self Care | Payer: BC Managed Care – PPO | Source: Home / Self Care

## 2021-08-30 DIAGNOSIS — G43009 Migraine without aura, not intractable, without status migrainosus: Secondary | ICD-10-CM

## 2021-08-30 MED ORDER — DEXAMETHASONE SODIUM PHOSPHATE 10 MG/ML IJ SOLN
10.0000 mg | Freq: Once | INTRAMUSCULAR | Status: AC
  Administered 2021-08-30: 10 mg via INTRAMUSCULAR

## 2021-08-30 MED ORDER — METOCLOPRAMIDE HCL 5 MG/ML IJ SOLN
5.0000 mg | Freq: Once | INTRAMUSCULAR | Status: AC
  Administered 2021-08-30: 5 mg via INTRAMUSCULAR

## 2021-08-30 MED ORDER — KETOROLAC TROMETHAMINE 30 MG/ML IJ SOLN
30.0000 mg | Freq: Once | INTRAMUSCULAR | Status: AC
  Administered 2021-08-30: 30 mg via INTRAMUSCULAR

## 2021-08-30 MED ORDER — RIZATRIPTAN BENZOATE 10 MG PO TBDP: ORAL_TABLET | ORAL | 0 refills | Status: DC

## 2021-08-30 NOTE — Discharge Instructions (Addendum)
You were given the migraine cocktail in our office (decadron, toradol and reglan). This can make you sleepy. Given how often you are having migraines, you would likely benefit from Nurtec. This medication is taken every other day to prevent migraines. Please have your PCP order this for you as it will require a prior auth through your insurance company. I have called in Maxalt-MLT for you in place of your normal maxalt. This medication can be taken without food and placed under your tongue at first onset of migraine symptoms.

## 2021-08-30 NOTE — ED Provider Notes (Signed)
[facility]    CSN: [id number] Arrival date & time: 08/30/21  1542      History   Chief Complaint Chief Complaint  Patient presents with   Migraine    Migraine, nausea, and vomiting. X1 day    HPI Morgan Duran is a 56 y.o. female.   Pleasant 56yo female presents today due to a migraine headache that started today around noon. Pt has a longstanding hx of recurrent migraines and states her sx are consistent with her normal ones. No new sx, denies worst headache of life. States at the start, it was just nausea. She didn't take her normal migraine medication because it was so mild. States however it progressed rapidly and was then too nauseated to eat. She has been instructed previously not to take her PO maxalt without a meal. She was seen here two months ago and given an injection which was effective, she was hoping to have this again today since she cannot tolerate PO medications at the moment. She denies fever, nuchal rigidity or any additional red flag neurological symptoms. Pt believes the change in the barometric pressure is what caused symptoms today. Pt admits to migraines as often as three times a week.    Migraine   Past Medical History:  Diagnosis Date   Anxiety    Arthritis    Benign cyst of left breast 04/09/2019   Diabetes mellitus without complication ([place])    type 2   Heart murmur    at birth, no problems   Hyperlipidemia    Hypertension    Migraine    Morbid obesity ([place])    OSA on CPAP    Sleep apnea    uses cpap nightly    Patient Active Problem List   Diagnosis Date Noted   Benign cyst of left breast 04/09/2019   Statin declined 12/24/2018   History of osteoporosis 12/24/2018   Colon cancer screening 05/29/2018   Diabetic eye exam ([place]) 05/29/2018   Diabetic nephropathy associated with type 2 diabetes mellitus ([place]) 05/26/2018   Diabetic polyneuropathy associated with type 2 diabetes mellitus ([place]) 05/26/2018   Noncompliance with  treatment plan 10/13/2017   Perimenopausal menorrhagia 10/13/2017   Migraine without aura and without status migrainosus, not intractable 09/30/2017   Class 3 severe obesity due to excess calories with serious comorbidity and body mass index (BMI) of 40.0 to 44.9 in adult ([place]) 04/03/2017   Hypertension associated with diabetes ([place]) 04/03/2017   DJD (degenerative joint disease) 03/23/2017   Aortic stenosis, mild 03/23/2017   Dyslipidemia associated with type 2 diabetes mellitus ([place]) 01/21/2017   OSA on CPAP 09/38/1829   Systolic murmur 93/71/6967   Metrorrhagia 05/31/2016    Past Surgical History:  Procedure Laterality Date   COLONOSCOPY  2003   [place] - Normal   KNEE ARTHROSCOPY Right    NASAL SEPTUM SURGERY     SHOULDER ARTHROSCOPY Left    TONSILLECTOMY     UPPER GASTROINTESTINAL ENDOSCOPY  2003   in [place] - Normal    OB History     Gravida  2   Para  2   Term      Preterm      AB      Living         SAB      IAB      Ectopic      Multiple      Live Births  Home Medications    Prior to Admission medications   Medication Sig Start Date End Date Taking? Authorizing Provider  rizatriptan (MAXALT-MLT) 10 MG disintegrating tablet Place one tab under tongue at first sign of migraine. May repeat every 2 hours as needed x 2. Do not exceed 3 tabs in 24 hours. 08/30/21  Yes Monserratt Knezevic L, PA  albuterol (VENTOLIN HFA) 108 (90 Base) MCG/ACT inhaler Inhale 2 puffs into the lungs every 6 (six) hours as needed for wheezing. 10/02/20   Emeterio Reeve, DO  glucose blood (ONE TOUCH ULTRA TEST) test strip USE AS INSTRUCTED 05/26/18   Trixie Dredge, PA-C  meloxicam (MOBIC) 7.5 MG tablet Take 7.5 mg by mouth 2 (two) times daily as needed. 10/20/19   [provider]  metFORMIN (GLUCOPHAGE XR) 500 MG 24 hr tablet Take 1 tablet (500 mg total) by mouth daily with breakfast. 03/26/20   Emeterio Reeve, DO  Multiple Vitamins-Minerals  (MULTIVITAMIN WOMEN PO) Take by mouth.    [provider]  Omega-3 Fatty Acids (FISH OIL PO) Take by mouth daily.    [provider]  ondansetron (ZOFRAN-ODT) 8 MG disintegrating tablet Take 1 tablet (8 mg total) by mouth every 8 (eight) hours as needed for nausea or vomiting. 06/30/21   Raylene Everts, MD    Family History Family History  Problem Relation Age of Onset   Emphysema Mother    Diabetes Father    Colon polyps Neg Hx    Rectal cancer Neg Hx    Stomach cancer Neg Hx     Social History Social History   Tobacco Use   Smoking status: Never   Smokeless tobacco: Never  Vaping Use   Vaping Use: Never used  Substance Use Topics   Alcohol use: Not Currently   Drug use: Never     Allergies   Patient has no known allergies.   Review of Systems Review of Systems As per HPI  Physical Exam Triage Vital Signs ED Triage Vitals  Enc Vitals Group     BP 08/30/21 1549 (!) 153/92     Pulse Rate 08/30/21 1549 69     Resp 08/30/21 1549 18     Temp 08/30/21 1549 97.6 F (36.4 C)     Temp Source 08/30/21 1549 Oral     SpO2 08/30/21 1549 98 %     Weight 08/30/21 1548 270 lb (122.5 kg)     Height 08/30/21 1548 '5\' 4"'$  (1.626 m)     Head Circumference --      Peak Flow --      Pain Score 08/30/21 1548 10     Pain Loc --      Pain Edu? --      Excl. in [place]? --    No data found.  Updated Vital Signs BP (!) 153/92 (BP Location: Left Arm)   Pulse 69   Temp 97.6 F (36.4 C) (Oral)   Resp 18   Ht '5\' 4"'$  (1.626 m)   Wt 270 lb (122.5 kg)   SpO2 98%   BMI 46.35 kg/m   Visual Acuity Right Eye Distance:   Left Eye Distance:   Bilateral Distance:    Right Eye Near:   Left Eye Near:    Bilateral Near:     Physical Exam Vitals and nursing note reviewed.  Constitutional:      General: She is not in acute distress.    Appearance: She is well-developed. She is obese. She is not  ill-appearing or toxic-appearing.  HENT:     Head: Normocephalic and  atraumatic.     Mouth/Throat:     Mouth: Mucous membranes are moist.     Pharynx: No oropharyngeal exudate or posterior oropharyngeal erythema.  Eyes:     Extraocular Movements: Extraocular movements intact.     Conjunctiva/sclera: Conjunctivae normal.     Pupils: Pupils are equal, round, and reactive to light.  Cardiovascular:     Rate and Rhythm: Normal rate and regular rhythm.     Heart sounds: No murmur heard. Pulmonary:     Effort: Pulmonary effort is normal. No respiratory distress.     Breath sounds: Normal breath sounds.  Abdominal:     Palpations: Abdomen is soft.     Tenderness: There is no abdominal tenderness.  Musculoskeletal:        General: No swelling.     Cervical back: Normal range of motion and neck supple. No rigidity or tenderness.  Lymphadenopathy:     Cervical: No cervical adenopathy.  Skin:    General: Skin is warm and dry.     Capillary Refill: Capillary refill takes less than 2 seconds.     Findings: No erythema or rash.  Neurological:     General: No focal deficit present.     Mental Status: She is alert and oriented to person, place, and time. Mental status is at baseline.     Cranial Nerves: Cranial nerves 2-12 are intact. No cranial nerve deficit, dysarthria or facial asymmetry.     Sensory: Sensation is intact.     Motor: Motor function is intact. No weakness, tremor or atrophy.     Coordination: Coordination normal.     Gait: Gait is intact. Gait normal.     Deep Tendon Reflexes: Reflexes are normal and symmetric.  Psychiatric:        Mood and Affect: Mood normal.     UC Treatments / Results  Labs (all labs ordered are listed, but only abnormal results are displayed) Labs Reviewed - No data to display  EKG   Radiology No results found.  Procedures Procedures (including critical care time)  Medications Ordered in UC Medications  ketorolac (TORADOL) 30 MG/ML injection 30 mg (30 mg Intramuscular Given 08/30/21 1610)  metoCLOPramide  (REGLAN) injection 5 mg (5 mg Intramuscular Given 08/30/21 1611)  dexamethasone (DECADRON) injection 10 mg (10 mg Intramuscular Given 08/30/21 1611)    Initial Impression / Assessment and Plan / UC Course  I have reviewed the triage vital signs and the nursing notes.  Pertinent labs & imaging results that were available during my care of the patient were reviewed by me and considered in my medical decision making (see chart for details).     Migraine - pt with her normal migraine symptoms, no red flag s/sx to warrant further workup. Pt unforunately has the PO maxalt which she cannot take during episodes of n/v during her migraines. Pt was given the migraine cocktail in office and monitored for adverse reactions. Recommended pt STOP her PO maxalt and switch to Maxalt MLT which can dissolve under tongue during first sign of migraine. Also recommended she discuss possible nurtec with PCP due to the frequency of her migraine. RTC/ ER precautions discussed.  Final Clinical Impressions(s) / UC Diagnoses   Final diagnoses:  Migraine without aura and without status migrainosus, not intractable     Discharge Instructions      You were given the migraine cocktail in our office (decadron, toradol and  reglan). This can make you sleepy. Given how often you are having migraines, you would likely benefit from Nurtec. This medication is taken every other day to prevent migraines. Please have your PCP order this for you as it will require a prior auth through your insurance company. I have called in Maxalt-MLT for you in place of your normal maxalt. This medication can be taken without food and placed under your tongue at first onset of migraine symptoms.     ED Prescriptions     Medication Sig Dispense Auth. Provider   rizatriptan (MAXALT-MLT) 10 MG disintegrating tablet Place one tab under tongue at first sign of migraine. May repeat every 2 hours as needed x 2. Do not exceed 3 tabs in 24 hours. 20  tablet Shalana Jardin L, [place]      PDMP not reviewed this encounter.   Chaney Malling, [place] 08/31/21 1946

## 2021-08-30 NOTE — ED Triage Notes (Signed)
Pt states that she has a migraine, nausea and vomiting. X1 day

## 2021-09-24 DIAGNOSIS — M722 Plantar fascial fibromatosis: Secondary | ICD-10-CM | POA: Diagnosis not present

## 2021-10-08 ENCOUNTER — Ambulatory Visit: Payer: BC Managed Care – PPO | Admitting: Medical-Surgical

## 2021-10-20 ENCOUNTER — Emergency Department
Admission: EM | Admit: 2021-10-20 | Discharge: 2021-10-20 | Disposition: A | Discharge status: Home / Self Care | Payer: BC Managed Care – PPO | Attending: Family Medicine | Admitting: Family Medicine

## 2021-10-20 DIAGNOSIS — G43019 Migraine without aura, intractable, without status migrainosus: Secondary | ICD-10-CM

## 2021-10-20 MED ORDER — ONDANSETRON 8 MG PO TBDP: 8.0000 mg | ORAL_TABLET | Freq: Three times a day (TID) | ORAL | 0 refills | Status: DC | PRN

## 2021-10-20 MED ORDER — KETOROLAC TROMETHAMINE 60 MG/2ML IM SOLN
60.0000 mg | Freq: Once | INTRAMUSCULAR | Status: AC
Start: 2021-10-20 — End: 2021-10-20
  Administered 2021-10-20: 60 mg via INTRAMUSCULAR

## 2021-10-20 MED ORDER — RIZATRIPTAN BENZOATE 10 MG PO TBDP: ORAL_TABLET | ORAL | 0 refills | Status: DC

## 2021-10-20 NOTE — ED Triage Notes (Signed)
Pt states that she has a migraine and some nausea. X1 day

## 2021-10-20 NOTE — Discharge Instructions (Signed)
See your PCP for discussion of migraine prevention

## 2021-10-20 NOTE — ED Provider Notes (Signed)
[facility]    CSN: [id number] Arrival date & time: 10/20/21  1319      History   Chief Complaint Chief Complaint  Patient presents with   Migraine    Migraine and nausea. X1 day    HPI Morgan Duran is a 56 y.o. female.   HPI Patient has a long history of migraines.  She woke up this morning with a typical migraine.  Has a severe headache.  Nausea but no vomiting.  Not responding to home medications.  Is an appointment with her primary care doctor to discuss prevention. No fever, signs of infection, neck stiffness No trauma Past Medical History:  Diagnosis Date   Anxiety    Arthritis    Benign cyst of left breast 04/09/2019   Diabetes mellitus without complication ([place])    type 2   Heart murmur    at birth, no problems   Hyperlipidemia    Hypertension    Migraine    Morbid obesity ([place])    OSA on CPAP    Sleep apnea    uses cpap nightly    Patient Active Problem List   Diagnosis Date Noted   Benign cyst of left breast 04/09/2019   Statin declined 12/24/2018   History of osteoporosis 12/24/2018   Colon cancer screening 05/29/2018   Diabetic eye exam ([place]) 05/29/2018   Diabetic nephropathy associated with type 2 diabetes mellitus ([place]) 05/26/2018   Diabetic polyneuropathy associated with type 2 diabetes mellitus ([place]) 05/26/2018   Noncompliance with treatment plan 10/13/2017   Perimenopausal menorrhagia 10/13/2017   Migraine without aura and without status migrainosus, not intractable 09/30/2017   Class 3 severe obesity due to excess calories with serious comorbidity and body mass index (BMI) of 40.0 to 44.9 in adult ([place]) 04/03/2017   Hypertension associated with diabetes ([place]) 04/03/2017   DJD (degenerative joint disease) 03/23/2017   Aortic stenosis, mild 03/23/2017   Dyslipidemia associated with type 2 diabetes mellitus ([place]) 01/21/2017   OSA on CPAP 01/60/1093   Systolic murmur 23/55/7322   Metrorrhagia 05/31/2016    Past Surgical History:   Procedure Laterality Date   COLONOSCOPY  2003   [place] - Normal   KNEE ARTHROSCOPY Right    NASAL SEPTUM SURGERY     SHOULDER ARTHROSCOPY Left    TONSILLECTOMY     UPPER GASTROINTESTINAL ENDOSCOPY  2003   in [place] - Normal    OB History     Gravida  2   Para  2   Term      Preterm      AB      Living         SAB      IAB      Ectopic      Multiple      Live Births               Home Medications    Prior to Admission medications   Medication Sig Start Date End Date Taking? Authorizing Provider  albuterol (VENTOLIN HFA) 108 (90 Base) MCG/ACT inhaler Inhale 2 puffs into the lungs every 6 (six) hours as needed for wheezing. 10/02/20  Yes Emeterio Reeve, DO  glucose blood (ONE TOUCH ULTRA TEST) test strip USE AS INSTRUCTED 05/26/18  Yes Trixie Dredge, PA-C  meloxicam (MOBIC) 7.5 MG tablet Take 7.5 mg by mouth 2 (two) times daily as needed. 10/20/19  Yes [provider]  metFORMIN (GLUCOPHAGE XR) 500 MG 24 hr tablet Take 1  tablet (500 mg total) by mouth daily with breakfast. 03/26/20  Yes Emeterio Reeve, DO  Multiple Vitamins-Minerals (MULTIVITAMIN WOMEN PO) Take by mouth.   Yes [provider]  Omega-3 Fatty Acids (FISH OIL PO) Take by mouth daily.   Yes [provider]  ondansetron (ZOFRAN-ODT) 8 MG disintegrating tablet Take 1 tablet (8 mg total) by mouth every 8 (eight) hours as needed for nausea or vomiting. 10/20/21   Raylene Everts, MD  rizatriptan (MAXALT-MLT) 10 MG disintegrating tablet Place one tab under tongue at first sign of migraine. May repeat every 2 hours as needed x 2. Do not exceed 3 tabs in 24 hours. 10/20/21   Raylene Everts, MD    Family History Family History  Problem Relation Age of Onset   Emphysema Mother    Diabetes Father    Colon polyps Neg Hx    Rectal cancer Neg Hx    Stomach cancer Neg Hx     Social History Social History   Tobacco Use   Smoking status: Never    Smokeless tobacco: Never  Vaping Use   Vaping Use: Never used  Substance Use Topics   Alcohol use: Not Currently   Drug use: Never     Allergies   Patient has no known allergies.   Review of Systems Review of Systems See HPI  Physical Exam Triage Vital Signs ED Triage Vitals  Enc Vitals Group     BP 10/20/21 1329 131/87     Pulse Rate 10/20/21 1329 97     Resp 10/20/21 1329 20     Temp 10/20/21 1329 98.6 F (37 C)     Temp Source 10/20/21 1329 Oral     SpO2 10/20/21 1329 95 %     Weight 10/20/21 1327 280 lb (127 kg)     Height 10/20/21 1327 '5\' 4"'$  (1.626 m)     Head Circumference --      Peak Flow --      Pain Score 10/20/21 1327 10     Pain Loc --      Pain Edu? --      Excl. in [place]? --    No data found.  Updated Vital Signs BP 131/87 (BP Location: Left Arm)   Pulse 97   Temp 98.6 F (37 C) (Oral)   Resp 20   Ht '5\' 4"'$  (1.626 m)   Wt 127 kg   SpO2 95%   BMI 48.06 kg/m       Physical Exam Constitutional:      General: She is in acute distress.     Appearance: She is well-developed. She is obese.     Comments: Patient acutely uncomfortable.  She is in darkened room.  HENT:     Head: Normocephalic and atraumatic.  Eyes:     Conjunctiva/sclera: Conjunctivae normal.     Pupils: Pupils are equal, round, and reactive to light.  Cardiovascular:     Rate and Rhythm: Normal rate.  Pulmonary:     Effort: Pulmonary effort is normal. No respiratory distress.  Abdominal:     General: There is no distension.     Palpations: Abdomen is soft.  Musculoskeletal:        General: Normal range of motion.     Cervical back: Normal range of motion.  Skin:    General: Skin is warm and dry.  Neurological:     General: No focal deficit present.     Mental Status: She is alert.  Psychiatric:        Mood and Affect: Mood normal.        Behavior: Behavior normal.      UC Treatments / Results  Labs (all labs ordered are listed, but only abnormal results are  displayed) Labs Reviewed - No data to display  EKG   Radiology No results found.  Procedures Procedures (including critical care time)  Medications Ordered in UC Medications  ketorolac (TORADOL) injection 60 mg (60 mg Intramuscular Given 10/20/21 1345)    Initial Impression / Assessment and Plan / UC Course  I have reviewed the triage vital signs and the nursing notes.  Pertinent labs & imaging results that were available during my care of the patient were reviewed by me and considered in my medical decision making (see chart for details).     Final Clinical Impressions(s) / UC Diagnoses   Final diagnoses:  Intractable migraine without aura and without status migrainosus     Discharge Instructions      See your PCP for discussion of migraine prevention   ED Prescriptions     Medication Sig Dispense Auth. Provider   ondansetron (ZOFRAN-ODT) 8 MG disintegrating tablet Take 1 tablet (8 mg total) by mouth every 8 (eight) hours as needed for nausea or vomiting. 20 tablet Raylene Everts, MD   rizatriptan (MAXALT-MLT) 10 MG disintegrating tablet Place one tab under tongue at first sign of migraine. May repeat every 2 hours as needed x 2. Do not exceed 3 tabs in 24 hours. 20 tablet Raylene Everts, MD      PDMP not reviewed this encounter.   Raylene Everts, MD 10/20/21 1400

## 2021-10-21 ENCOUNTER — Telehealth: Payer: Self-pay | Admitting: Emergency Medicine

## 2021-10-21 NOTE — Telephone Encounter (Signed)
Follow call regarding visit on 10/20/21. Voice mail left for call back if patient had any concerns or questions.

## 2021-10-22 ENCOUNTER — Ambulatory Visit: Payer: BC Managed Care – PPO | Admitting: Medical-Surgical

## 2021-11-04 NOTE — Progress Notes (Unsigned)
     Established patient visit   Patient: Morgan Duran   DOB: 10-24-1965   56 y.o. Female  MRN: [id number] Visit Date: 11/05/2021  Today's healthcare provider: Owens Loffler, DO   No chief complaint on file.   SUBJECTIVE   No chief complaint on file.  Pt presents to clinic for surgical clearance for knee replacement. Pt has pmh of HTN, T2DM, aortic stenosis, obesity, and dyslipidemia.   Pt needs updated labs. Upon Epic chart review she has not had a cholesterol or A1C check since 2021. She is not currently on statin therapy. I also do not see any blood pressure medication on her list. Records also indicate she needs a tetanus vaccine.   Can you walk up a flight of stairs without difficulty?        Review of Systems     No outpatient medications have been marked as taking for the 11/05/21 encounter (Appointment) with Owens Loffler, DO.    OBJECTIVE    There were no vitals taken for this visit.  Physical Exam   {Show previous labs (optional):23736}    ASSESSMENT & PLAN    Problem List Items Addressed This Visit   None   No follow-ups on file.      No orders of the defined types were placed in this encounter.   No orders of the defined types were placed in this encounter.    Owens Loffler, DO  [facility] [phone] (phone) [phone] (fax)  [place]

## 2021-11-05 ENCOUNTER — Ambulatory Visit (INDEPENDENT_AMBULATORY_CARE_PROVIDER_SITE_OTHER): Payer: BC Managed Care – PPO | Admitting: Family Medicine

## 2021-11-05 ENCOUNTER — Ambulatory Visit: Payer: BC Managed Care – PPO | Admitting: Family Medicine

## 2021-11-05 VITALS — BP 146/87 | HR 87 | Ht 63.0 in | Wt 261.0 lb

## 2021-11-05 DIAGNOSIS — E1169 Type 2 diabetes mellitus with other specified complication: Secondary | ICD-10-CM | POA: Diagnosis not present

## 2021-11-05 DIAGNOSIS — Z6841 Body Mass Index (BMI) 40.0 and over, adult: Secondary | ICD-10-CM

## 2021-11-05 DIAGNOSIS — I152 Hypertension secondary to endocrine disorders: Secondary | ICD-10-CM | POA: Diagnosis not present

## 2021-11-05 DIAGNOSIS — Z01810 Encounter for preprocedural cardiovascular examination: Secondary | ICD-10-CM

## 2021-11-05 DIAGNOSIS — E1159 Type 2 diabetes mellitus with other circulatory complications: Secondary | ICD-10-CM | POA: Diagnosis not present

## 2021-11-05 DIAGNOSIS — I10 Essential (primary) hypertension: Secondary | ICD-10-CM | POA: Diagnosis not present

## 2021-11-05 DIAGNOSIS — E1121 Type 2 diabetes mellitus with diabetic nephropathy: Secondary | ICD-10-CM

## 2021-11-05 DIAGNOSIS — M1711 Unilateral primary osteoarthritis, right knee: Secondary | ICD-10-CM

## 2021-11-05 MED ORDER — METFORMIN HCL ER 500 MG PO TB24: 500.0000 mg | ORAL_TABLET | Freq: Every day | ORAL | 3 refills | Status: DC

## 2021-11-05 MED ORDER — GLUCOSE BLOOD VI STRP: ORAL_STRIP | 1 refills | Status: AC

## 2021-11-05 NOTE — Assessment & Plan Note (Signed)
-  BMI elevated to 46  - can't walk up two flights of stairs without becoming short of breath (<1 MET) - unclear what A1C is, repeating today - high risk for non-cardiac surgery at this time due to BMI and METs

## 2021-11-05 NOTE — Assessment & Plan Note (Signed)
-   have ordered aquatic therapy for patient to see if we can get her exercise in the midst of her OA pain

## 2021-11-05 NOTE — Assessment & Plan Note (Addendum)
-   have reordered A1C for monitoring as well as kidney function - continue metformin and ozempic - instructed pt to take ozempic 0.25 for one month and then go up to 0.5 - will follow up in 3 months for A1C follow up - recommended diabetic diet

## 2021-11-05 NOTE — Assessment & Plan Note (Signed)
-   pt currently on ozempic for diabetes will give [place] added benefit of weight loss  - does understand her BMI needs to be down to 40 for surgery - encouraged exercise with aquatic therapy to see if we can get pt moving

## 2021-11-06 ENCOUNTER — Other Ambulatory Visit: Payer: Self-pay | Admitting: Family Medicine

## 2021-11-06 LAB — COMPLETE METABOLIC PANEL WITH GFR
AG Ratio: 1.7 (calc) (ref 1.0–2.5)
ALT: 30 U/L — ABNORMAL HIGH (ref 6–29)
AST: 27 U/L (ref 10–35)
Albumin: 4.4 g/dL (ref 3.6–5.1)
Alkaline phosphatase (APISO): 100 U/L (ref 37–153)
BUN: 14 mg/dL (ref 7–25)
CO2: 27 mmol/L (ref 20–32)
Calcium: 9.7 mg/dL (ref 8.6–10.4)
Chloride: 101 mmol/L (ref 98–110)
Creat: 0.84 mg/dL (ref 0.50–1.03)
Globulin: 2.6 g/dL (calc) (ref 1.9–3.7)
Glucose, Bld: 187 mg/dL — ABNORMAL HIGH (ref 65–99)
Potassium: 4 mmol/L (ref 3.5–5.3)
Sodium: 139 mmol/L (ref 135–146)
Total Bilirubin: 0.5 mg/dL (ref 0.2–1.2)
Total Protein: 7 g/dL (ref 6.1–8.1)
eGFR: 82 mL/min/{1.73_m2} (ref >=60)

## 2021-11-06 LAB — MICROALBUMIN / CREATININE URINE RATIO
Creatinine, Urine: 215 mg/dL (ref 20–275)
Microalb Creat Ratio: 13 mcg/mg creat (ref <30)
Microalb, Ur: 2.8 mg/dL

## 2021-11-06 LAB — CBC
HCT: 41 % (ref 35.0–45.0)
Hemoglobin: 13.7 g/dL (ref 11.7–15.5)
MCH: 30 pg (ref 27.0–33.0)
MCHC: 33.4 g/dL (ref 32.0–36.0)
MCV: 89.9 fL (ref 80.0–100.0)
MPV: 10.4 fL (ref 7.5–12.5)
Platelets: 339 10*3/uL (ref 140–400)
RBC: 4.56 10*6/uL (ref 3.80–5.10)
RDW: 12.4 % (ref 11.0–15.0)
WBC: 6.6 10*3/uL (ref 3.8–10.8)

## 2021-11-06 LAB — HEMOGLOBIN A1C
Hgb A1c MFr Bld: 10.2 % of total Hgb — ABNORMAL HIGH (ref <5.7)
Mean Plasma Glucose: 246 mg/dL
eAG (mmol/L): 13.6 mmol/L

## 2021-11-06 LAB — LIPID PANEL
Cholesterol: 200 mg/dL — ABNORMAL HIGH (ref <200)
HDL: 63 mg/dL (ref >=50)
LDL Cholesterol (Calc): 121 mg/dL (calc) — ABNORMAL HIGH
Non-HDL Cholesterol (Calc): 137 mg/dL (calc) — ABNORMAL HIGH (ref <130)
Total CHOL/HDL Ratio: 3.2 (calc) (ref <5.0)
Triglycerides: 64 mg/dL (ref <150)

## 2021-11-09 ENCOUNTER — Other Ambulatory Visit: Payer: Self-pay | Admitting: Family Medicine

## 2021-11-09 ENCOUNTER — Telehealth: Payer: Self-pay | Admitting: Family Medicine

## 2021-11-09 MED ORDER — ATORVASTATIN CALCIUM 40 MG PO TABS: 40.0000 mg | ORAL_TABLET | Freq: Every day | ORAL | 3 refills | Status: DC

## 2021-11-09 NOTE — Telephone Encounter (Signed)
Pt called.  Her pharmacy has not gotten the script fot the higher dose of Metformin

## 2021-11-10 ENCOUNTER — Other Ambulatory Visit: Payer: Self-pay | Admitting: Family Medicine

## 2021-11-10 DIAGNOSIS — E1121 Type 2 diabetes mellitus with diabetic nephropathy: Secondary | ICD-10-CM

## 2021-11-10 MED ORDER — METFORMIN HCL 1000 MG PO TABS: 1000.0000 mg | ORAL_TABLET | Freq: Two times a day (BID) | ORAL | 3 refills | Status: DC

## 2021-11-10 NOTE — Telephone Encounter (Signed)
Morgan Duran states she was expecting an increase of Metformin. She states she was taking Metformin 500 mg ER once daily and was expecting Metformin 500 mg twice daily. Please advise.

## 2021-11-10 NOTE — Telephone Encounter (Signed)
Patient advised the new prescription has been sent to the pharmacy.

## 2021-11-24 DIAGNOSIS — R2689 Other abnormalities of gait and mobility: Secondary | ICD-10-CM | POA: Diagnosis not present

## 2021-11-24 DIAGNOSIS — M25561 Pain in right knee: Secondary | ICD-10-CM | POA: Diagnosis not present

## 2021-11-26 DIAGNOSIS — E1142 Type 2 diabetes mellitus with diabetic polyneuropathy: Secondary | ICD-10-CM | POA: Diagnosis not present

## 2021-11-26 DIAGNOSIS — M25561 Pain in right knee: Secondary | ICD-10-CM | POA: Diagnosis not present

## 2021-11-26 DIAGNOSIS — M1711 Unilateral primary osteoarthritis, right knee: Secondary | ICD-10-CM | POA: Diagnosis not present

## 2021-11-26 DIAGNOSIS — M25461 Effusion, right knee: Secondary | ICD-10-CM | POA: Diagnosis not present

## 2021-11-30 ENCOUNTER — Telehealth: Payer: Self-pay | Admitting: Family Medicine

## 2021-11-30 DIAGNOSIS — R2689 Other abnormalities of gait and mobility: Secondary | ICD-10-CM | POA: Diagnosis not present

## 2021-11-30 DIAGNOSIS — M25561 Pain in right knee: Secondary | ICD-10-CM | POA: Diagnosis not present

## 2021-11-30 NOTE — Telephone Encounter (Signed)
Pt called in this afternoon asking about insulin injections. She stated that sugar is currently at a 10. It needs to be at a 7 by10/11 for her surgery consultation. Pt is not sure what she can do to bring it down and would like some suggestions. She is asking if someone could give her a call.

## 2021-12-01 ENCOUNTER — Ambulatory Visit (INDEPENDENT_AMBULATORY_CARE_PROVIDER_SITE_OTHER): Payer: BC Managed Care – PPO | Admitting: Family Medicine

## 2021-12-01 ENCOUNTER — Telehealth: Payer: Self-pay | Admitting: Family Medicine

## 2021-12-01 VITALS — BP 148/69 | HR 92

## 2021-12-01 DIAGNOSIS — Z01810 Encounter for preprocedural cardiovascular examination: Secondary | ICD-10-CM

## 2021-12-01 DIAGNOSIS — E1165 Type 2 diabetes mellitus with hyperglycemia: Secondary | ICD-10-CM

## 2021-12-01 DIAGNOSIS — M25561 Pain in right knee: Secondary | ICD-10-CM | POA: Diagnosis not present

## 2021-12-01 DIAGNOSIS — R42 Dizziness and giddiness: Secondary | ICD-10-CM | POA: Diagnosis not present

## 2021-12-01 DIAGNOSIS — R2689 Other abnormalities of gait and mobility: Secondary | ICD-10-CM | POA: Diagnosis not present

## 2021-12-01 LAB — GLUCOSE, POCT (MANUAL RESULT ENTRY): POC Glucose: 121 mg/dl — AB (ref 70–99)

## 2021-12-01 MED ORDER — INSULIN DETEMIR 100 UNIT/ML ~~LOC~~ SOLN: 5.0000 [IU] | Freq: Every day | SUBCUTANEOUS | 11 refills | Status: DC

## 2021-12-01 NOTE — Telephone Encounter (Signed)
Spoke with pt when she showed up at counter

## 2021-12-01 NOTE — Telephone Encounter (Signed)
Patient came in to office and stated she has left multiple messages and needs a prescription for  Levemir - this is the one her insurance covers. Patient stated this is the "something extra" added due to her high sugar Per convo at last visit with PCP - She would like it sent to [facility] [address]. lmr

## 2021-12-01 NOTE — Progress Notes (Signed)
Acute Office Visit  Subjective:     Patient ID: Morgan Duran, female    DOB: Jun 09, 1965, 56 y.o.   MRN: [id number]  Chief Complaint  Patient presents with   Dizziness    Dizziness Pertinent negatives include no chest pain, chills, coughing, fever or headaches.   Patient is in today for acute visit. She came to the office without an appointment feeling faint. POC glucose checked and was 121. She said she ate lettuce and cheese at lunch and took her metformin at lunch. She does take the metformin BID but does not eat breakfast. She has greatly reduced her caloric intake in preparation for her surgery in October. Her A1C is 10.2 and she needs it at a 7 to have surgery. She says she has been exercising more frequently and trying to eat healthier.   Review of Systems  Constitutional:  Negative for chills and fever.  Respiratory:  Negative for cough and shortness of breath.   Cardiovascular:  Negative for chest pain.  Neurological:  Positive for dizziness. Negative for headaches.        Objective:    BP (!) 148/69   Pulse 92   SpO2 100% Comment: on RA   Physical Exam Vitals reviewed.  Constitutional:      Appearance: She is well-developed.     Comments: Pt looks pale   HENT:     Head: Normocephalic and atraumatic.  Eyes:     Conjunctiva/sclera: Conjunctivae normal.  Cardiovascular:     Rate and Rhythm: Normal rate.  Pulmonary:     Effort: Pulmonary effort is normal.  Skin:    General: Skin is dry.     Coloration: Skin is not pale.  Neurological:     Mental Status: She is alert and oriented to person, place, and time.  Psychiatric:        Behavior: Behavior normal.     Results for orders placed or performed in visit on 12/01/21  POCT glucose (manual entry)  Result Value Ref Range   POC Glucose 121 (A) 70 - 99 mg/dl       Assessment & Plan:   Problem List Items Addressed This Visit       Endocrine   Uncontrolled type 2 diabetes mellitus with hyperglycemia  ([place]) - Primary    - pt taking metformin '1000mg'$  BID  - takes her ozempic on Fridays. She tells me she has increased her dose to 0.'5mg'$   - will add levemir 5u nightly - pt says she has a glucometer and we discussed the parameters for taking her sugars. She has a follow up with me in three days and she will bring back her numbers to report back and see if adjustments need to be made with the levemir. Pt verbalized understanding.  - also discussed with her that she needs to be eating adequate meals and not starving herself  - pt says she has been on levemir in the past and understands how it works.       Relevant Medications   insulin detemir (LEVEMIR) 100 UNIT/ML injection   Other Relevant Orders   POCT glucose (manual entry) (Completed)     Other   Lightheadedness    - vitals stable. Possibly due to decreased food intake. Advised pt to eat and keep snacks with her - she is wanting a work excuse for today.         Meds ordered this encounter  Medications   insulin detemir (LEVEMIR) 100 UNIT/ML injection  Sig: Inject 0.05 mLs (5 Units total) into the skin at bedtime.    Dispense:  10 mL    Refill:  11    No follow-ups on file.  Owens Loffler, DO

## 2021-12-01 NOTE — Progress Notes (Signed)
Pt here for POC glucose per Dr. Mel Almond.  Charyl Bigger, CMA

## 2021-12-01 NOTE — Assessment & Plan Note (Signed)
-   vitals stable. Possibly due to decreased food intake. Advised pt to eat and keep snacks with her - she is wanting a work excuse for today.

## 2021-12-01 NOTE — Assessment & Plan Note (Addendum)
-   pt taking metformin '1000mg'$  BID  - takes her ozempic on Fridays. She tells me she has increased her dose to 0.'5mg'$   - will add levemir 5u nightly - pt says she has a glucometer and we discussed the parameters for taking her sugars. She has a follow up with me in three days and she will bring back her numbers to report back and see if adjustments need to be made with the levemir. Pt verbalized understanding.  - also discussed with her that she needs to be eating adequate meals and not starving herself  - pt says she has been on levemir in the past and understands how it works.

## 2021-12-02 DIAGNOSIS — M25561 Pain in right knee: Secondary | ICD-10-CM | POA: Diagnosis not present

## 2021-12-02 DIAGNOSIS — R2689 Other abnormalities of gait and mobility: Secondary | ICD-10-CM | POA: Diagnosis not present

## 2021-12-04 ENCOUNTER — Ambulatory Visit (INDEPENDENT_AMBULATORY_CARE_PROVIDER_SITE_OTHER): Payer: BC Managed Care – PPO | Admitting: Family Medicine

## 2021-12-04 ENCOUNTER — Encounter: Payer: Self-pay | Admitting: Family Medicine

## 2021-12-04 VITALS — BP 116/81 | HR 91 | Temp 98.2°F | Ht 64.5 in | Wt 252.1 lb

## 2021-12-04 DIAGNOSIS — E1165 Type 2 diabetes mellitus with hyperglycemia: Secondary | ICD-10-CM | POA: Diagnosis not present

## 2021-12-04 MED ORDER — "INSULIN SYRINGE 31G X 5/16"" 0.3 ML MISC": 1.0000 | Freq: Every day | 3 refills | Status: DC | PRN

## 2021-12-04 NOTE — Assessment & Plan Note (Addendum)
-   continue levemir 5u, ozempic at 0.'5mg'$   - discussed that we will not increase to '1mg'$  of ozempic unless we need better glycemic control. If sugars are where they are today no ozempic adjustment needed - follow up in 3 months - continue diet and exercise - re-ordered syringes for insulin management.

## 2021-12-04 NOTE — Progress Notes (Signed)
   Acute Office Visit  Subjective:     Patient ID: Morgan Duran, female    DOB: 1965-12-14, 56 y.o.   MRN: [id number]  Chief Complaint  Patient presents with   Diabetes   Weight Check    HPI Patient is in today for diabetes visit. She reports home sugars: 158 pre-prandial, 131 pre-prandial, 121 pre-prandial. She started taking her Levemir last night at 5u. Vitals are stable at 116/81. Her weight is 252lbs and she was told that she needs to weigh 245lbs. 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 She is also on ozempic 0.'5mg'$   Review of Systems  Constitutional:  Negative for chills and fever.  Respiratory:  Negative for cough and shortness of breath.   Cardiovascular:  Negative for chest pain.  Neurological:  Negative for headaches.        Objective:    BP 116/81 (BP Location: Left Arm, Patient Position: Sitting, Cuff Size: Large)   Pulse 91   Temp 98.2 F (36.8 C) (Oral)   Ht 5' 4.5" (1.638 m)   Wt 252 lb 1.9 oz (114.4 kg)   SpO2 96%   BMI 42.61 kg/m    Physical Exam Vitals and nursing note reviewed.  Constitutional:      General: She is not in acute distress.    Appearance: Normal appearance.  HENT:     Head: Normocephalic and atraumatic.     Right Ear: External ear normal.     Left Ear: External ear normal.     Nose: Nose normal.  Eyes:     Conjunctiva/sclera: Conjunctivae normal.  Cardiovascular:     Rate and Rhythm: Normal rate and regular rhythm.  Pulmonary:     Effort: Pulmonary effort is normal.     Breath sounds: Normal breath sounds.  Neurological:     General: No focal deficit present.     Mental Status: She is alert and oriented to person, place, and time.  Psychiatric:        Mood and Affect: Mood normal.        Behavior: Behavior normal.        Thought Content: Thought content normal.        Judgment: Judgment normal.     No results found for any visits on 12/04/21.      Assessment & Plan:   Problem List Items Addressed This Visit        Endocrine   Uncontrolled type 2 diabetes mellitus with hyperglycemia ([place]) - Primary    - continue levemir 5u, ozempic at 0.'5mg'$   - discussed that we will not increase to '1mg'$  of ozempic unless we need better glycemic control. If sugars are where they are today no ozempic adjustment needed - follow up in 3 months - continue diet and exercise - re-ordered syringes for insulin management.       Relevant Medications   Insulin Syringe-Needle U-100 (INSULIN SYRINGE .3CC/31GX5/16") 31G X 5/16" 0.3 ML MISC    Meds ordered this encounter  Medications   Insulin Syringe-Needle U-100 (INSULIN SYRINGE .3CC/31GX5/16") 31G X 5/16" 0.3 ML MISC    Sig: 1 Syringe by Does not apply route daily as needed.    Dispense:  90 each    Refill:  3    Return in about 9 weeks (around 02/05/2022).  Owens Loffler, DO

## 2021-12-08 ENCOUNTER — Encounter (INDEPENDENT_AMBULATORY_CARE_PROVIDER_SITE_OTHER): Payer: BC Managed Care – PPO | Admitting: Family Medicine

## 2021-12-08 DIAGNOSIS — R112 Nausea with vomiting, unspecified: Secondary | ICD-10-CM

## 2021-12-08 DIAGNOSIS — M25561 Pain in right knee: Secondary | ICD-10-CM | POA: Diagnosis not present

## 2021-12-08 DIAGNOSIS — R2689 Other abnormalities of gait and mobility: Secondary | ICD-10-CM | POA: Diagnosis not present

## 2021-12-09 DIAGNOSIS — M25561 Pain in right knee: Secondary | ICD-10-CM | POA: Diagnosis not present

## 2021-12-09 DIAGNOSIS — R2689 Other abnormalities of gait and mobility: Secondary | ICD-10-CM | POA: Diagnosis not present

## 2021-12-15 ENCOUNTER — Ambulatory Visit: Payer: BC Managed Care – PPO | Admitting: Medical-Surgical

## 2021-12-30 ENCOUNTER — Other Ambulatory Visit: Payer: Self-pay

## 2021-12-30 MED ORDER — RIZATRIPTAN BENZOATE 10 MG PO TBDP: ORAL_TABLET | ORAL | 0 refills | Status: DC

## 2022-01-01 ENCOUNTER — Other Ambulatory Visit: Payer: Self-pay | Admitting: Family Medicine

## 2022-01-01 MED ORDER — ONDANSETRON 8 MG PO TBDP: 8.0000 mg | ORAL_TABLET | Freq: Three times a day (TID) | ORAL | 0 refills | Status: DC | PRN

## 2022-01-01 NOTE — Telephone Encounter (Signed)
Please see the MyChart message reply(ies) for my assessment and plan.    This patient gave consent for this Medical Advice Message and is aware that it may result in a bill to [employer], as well as the possibility of receiving a bill for a co-payment or deductible. They are an established patient, but are not seeking medical advice exclusively about a problem treated during an in person or video visit in the last seven days. I did not recommend an in person or video visit within seven days of my reply.    I spent a total of 10 minutes cumulative time within 7 days through [employer].  Please see mychart message for further information on the encounter.   Owens Loffler, DO

## 2022-02-05 ENCOUNTER — Encounter: Payer: Self-pay | Admitting: Family Medicine

## 2022-02-05 ENCOUNTER — Ambulatory Visit (INDEPENDENT_AMBULATORY_CARE_PROVIDER_SITE_OTHER): Payer: BC Managed Care – PPO | Admitting: Family Medicine

## 2022-02-05 VITALS — BP 134/84 | HR 98 | Ht 64.5 in | Wt 237.0 lb

## 2022-02-05 DIAGNOSIS — E1165 Type 2 diabetes mellitus with hyperglycemia: Secondary | ICD-10-CM

## 2022-02-05 LAB — POCT GLYCOSYLATED HEMOGLOBIN (HGB A1C): Hemoglobin A1C: 6.9 % — AB (ref 4.0–5.6)

## 2022-02-05 NOTE — Progress Notes (Signed)
Established patient visit   Patient: Morgan Duran   DOB: December 15, 1965   56 y.o. Female  MRN: [id number] Visit Date: 02/05/2022  Today's healthcare provider: Owens Loffler, DO   Chief Complaint  Patient presents with   Follow-up    SUBJECTIVE    Chief Complaint  Patient presents with   Follow-up   HPI  Pt presents for diabetes follow up. She is on ozempic, levemir, and metformin. She has been working out more regularly and is down in weight about 20lbs. She is on ozempic '1mg'$  and levemir 10u. She is increasing protein and is eating healthier. She has limited sugar and is only drinking water. Today her sugar was 150 before breakfast and she did not get good sleep last night. Sugars have been in the low 100s-120s.   Review of Systems  Constitutional:  Negative for activity change, fatigue and fever.  Respiratory:  Negative for cough and shortness of breath.   Cardiovascular:  Negative for chest pain.  Gastrointestinal:  Negative for abdominal pain.  Genitourinary:  Negative for difficulty urinating.       Current Meds  Medication Sig   atorvastatin (LIPITOR) 40 MG tablet Take 1 tablet (40 mg total) by mouth daily.   glucose blood (ONE TOUCH ULTRA TEST) test strip USE AS INSTRUCTED   insulin detemir (LEVEMIR) 100 UNIT/ML injection Inject 0.05 mLs (5 Units total) into the skin at bedtime.   Insulin Syringe-Needle U-100 (INSULIN SYRINGE .3CC/31GX5/16") 31G X 5/16" 0.3 ML MISC 1 Syringe by Does not apply route daily as needed.   metFORMIN (GLUCOPHAGE) 1000 MG tablet Take 1 tablet (1,000 mg total) by mouth 2 (two) times daily with a meal.   Multiple Vitamins-Minerals (MULTIVITAMIN WOMEN PO) Take by mouth.   Omega-3 Fatty Acids (FISH OIL PO) Take by mouth daily.   ondansetron (ZOFRAN-ODT) 8 MG disintegrating tablet Take 1 tablet (8 mg total) by mouth every 8 (eight) hours as needed for nausea or vomiting.   rizatriptan (MAXALT-MLT) 10 MG disintegrating tablet Place one tab under  tongue at first sign of migraine. May repeat every 2 hours as needed x 2. Do not exceed 3 tabs in 24 hours.   Semaglutide,0.25 or 0.'5MG'$ /DOS, (OZEMPIC, 0.25 OR 0.5 MG/DOSE,) 2 MG/3ML SOPN Inject into the skin.    OBJECTIVE    BP 134/84   Pulse 98   Ht 5' 4.5" (1.638 m)   Wt 237 lb (107.5 kg)   SpO2 100%   BMI 40.05 kg/m   Physical Exam Vitals and nursing note reviewed.  Constitutional:      General: She is not in acute distress.    Appearance: Normal appearance.  HENT:     Head: Normocephalic and atraumatic.     Right Ear: External ear normal.     Left Ear: External ear normal.     Nose: Nose normal.  Eyes:     Conjunctiva/sclera: Conjunctivae normal.  Cardiovascular:     Rate and Rhythm: Normal rate and regular rhythm.  Pulmonary:     Effort: Pulmonary effort is normal.     Breath sounds: Normal breath sounds.  Neurological:     General: No focal deficit present.     Mental Status: She is alert and oriented to person, place, and time.  Psychiatric:        Mood and Affect: Mood normal.        Behavior: Behavior normal.        Thought Content: Thought content normal.  Judgment: Judgment normal.          ASSESSMENT & PLAN    Problem List Items Addressed This Visit       Endocrine   Uncontrolled type 2 diabetes mellitus with hyperglycemia ([place]) - Primary    - POC A1C is at 6.9%, down from 10.2 in August - pt is doing excellent - continue diet and exercise - she is scheduled for surgery in December  - we will follow up in 4 months       Relevant Orders   POCT HgB A1C (Completed)    Return in about 5 months (around 07/07/2022).      No orders of the defined types were placed in this encounter.   Orders Placed This Encounter  Procedures   POCT HgB A1C     Owens Loffler, [place] [phone] (phone) [phone] (fax)  [place]

## 2022-02-05 NOTE — Assessment & Plan Note (Signed)
-   POC A1C is at 6.9%, down from 10.2 in August - pt is doing excellent - continue diet and exercise - she is scheduled for surgery in December  - we will follow up in 4 months

## 2022-02-12 ENCOUNTER — Encounter: Payer: Self-pay | Admitting: Family Medicine

## 2022-02-12 DIAGNOSIS — E1165 Type 2 diabetes mellitus with hyperglycemia: Secondary | ICD-10-CM

## 2022-02-18 ENCOUNTER — Telehealth: Payer: Self-pay

## 2022-02-18 ENCOUNTER — Other Ambulatory Visit: Payer: Self-pay | Admitting: Family Medicine

## 2022-02-18 MED ORDER — SEMAGLUTIDE (1 MG/DOSE) 4 MG/3ML ~~LOC~~ SOPN: 1.0000 mg | PEN_INJECTOR | SUBCUTANEOUS | 0 refills | Status: DC

## 2022-02-18 MED ORDER — OZEMPIC (0.25 OR 0.5 MG/DOSE) 2 MG/3ML ~~LOC~~ SOPN: 0.2500 mg | PEN_INJECTOR | SUBCUTANEOUS | 3 refills | Status: DC

## 2022-02-18 NOTE — Telephone Encounter (Signed)
Initiated Prior authorization [id number] 1 mg  Via: Covermymeds Case/Key:[id number] Status: approved  as of 02/18/22 Reason:Coverage Start Date:01/19/2022;Coverage End Date:02/18/2023; Notified Pt via: Mychart,called pt

## 2022-02-19 ENCOUNTER — Other Ambulatory Visit: Payer: Self-pay | Admitting: Family Medicine

## 2022-02-24 ENCOUNTER — Other Ambulatory Visit: Payer: Self-pay

## 2022-02-24 MED ORDER — SEMAGLUTIDE (1 MG/DOSE) 4 MG/3ML ~~LOC~~ SOPN: 1.0000 mg | PEN_INJECTOR | SUBCUTANEOUS | 0 refills | Status: DC

## 2022-03-02 ENCOUNTER — Other Ambulatory Visit: Payer: Self-pay

## 2022-03-02 DIAGNOSIS — G43009 Migraine without aura, not intractable, without status migrainosus: Secondary | ICD-10-CM

## 2022-03-02 MED ORDER — RIZATRIPTAN BENZOATE 10 MG PO TBDP: ORAL_TABLET | ORAL | 0 refills | Status: DC

## 2022-03-10 DIAGNOSIS — R634 Abnormal weight loss: Secondary | ICD-10-CM | POA: Diagnosis not present

## 2022-03-10 DIAGNOSIS — M25561 Pain in right knee: Secondary | ICD-10-CM | POA: Diagnosis not present

## 2022-03-10 DIAGNOSIS — E1159 Type 2 diabetes mellitus with other circulatory complications: Secondary | ICD-10-CM | POA: Diagnosis not present

## 2022-03-10 DIAGNOSIS — E1169 Type 2 diabetes mellitus with other specified complication: Secondary | ICD-10-CM | POA: Diagnosis not present

## 2022-03-10 DIAGNOSIS — I350 Nonrheumatic aortic (valve) stenosis: Secondary | ICD-10-CM | POA: Diagnosis not present

## 2022-03-10 DIAGNOSIS — E785 Hyperlipidemia, unspecified: Secondary | ICD-10-CM | POA: Diagnosis not present

## 2022-03-10 DIAGNOSIS — Z7985 Long-term (current) use of injectable non-insulin antidiabetic drugs: Secondary | ICD-10-CM | POA: Diagnosis not present

## 2022-03-10 DIAGNOSIS — M1711 Unilateral primary osteoarthritis, right knee: Secondary | ICD-10-CM | POA: Diagnosis not present

## 2022-03-10 DIAGNOSIS — G4733 Obstructive sleep apnea (adult) (pediatric): Secondary | ICD-10-CM | POA: Diagnosis not present

## 2022-03-10 DIAGNOSIS — Z6841 Body Mass Index (BMI) 40.0 and over, adult: Secondary | ICD-10-CM | POA: Diagnosis not present

## 2022-03-10 DIAGNOSIS — Z79899 Other long term (current) drug therapy: Secondary | ICD-10-CM | POA: Diagnosis not present

## 2022-03-18 ENCOUNTER — Other Ambulatory Visit: Payer: Self-pay | Admitting: Family Medicine

## 2022-03-18 ENCOUNTER — Telehealth: Payer: Self-pay | Admitting: Family Medicine

## 2022-03-18 DIAGNOSIS — G43009 Migraine without aura, not intractable, without status migrainosus: Secondary | ICD-10-CM

## 2022-03-18 DIAGNOSIS — E1165 Type 2 diabetes mellitus with hyperglycemia: Secondary | ICD-10-CM

## 2022-03-18 NOTE — Telephone Encounter (Signed)
Pt called and has a f/u scheduled with Dr Mel Almond in April but needs her Levemir, rizatripton, and [facility]fran [facility] and enough to last her until April.

## 2022-03-19 ENCOUNTER — Other Ambulatory Visit: Payer: Self-pay | Admitting: Family Medicine

## 2022-03-19 DIAGNOSIS — G43009 Migraine without aura, not intractable, without status migrainosus: Secondary | ICD-10-CM

## 2022-03-19 DIAGNOSIS — E1165 Type 2 diabetes mellitus with hyperglycemia: Secondary | ICD-10-CM

## 2022-03-19 MED ORDER — ONDANSETRON 8 MG PO TBDP: ORAL_TABLET | ORAL | 5 refills | Status: DC

## 2022-03-19 MED ORDER — INSULIN DETEMIR 100 UNIT/ML ~~LOC~~ SOLN: 5.0000 [IU] | Freq: Every day | SUBCUTANEOUS | 6 refills | Status: DC

## 2022-03-19 MED ORDER — INSULIN DETEMIR 100 UNIT/ML ~~LOC~~ SOLN: 5.0000 [IU] | Freq: Every day | SUBCUTANEOUS | 1 refills | Status: DC

## 2022-03-19 MED ORDER — RIZATRIPTAN BENZOATE 10 MG PO TBDP: ORAL_TABLET | ORAL | 3 refills | Status: DC

## 2022-03-19 NOTE — Telephone Encounter (Signed)
Refills sent

## 2022-03-23 DIAGNOSIS — G8918 Other acute postprocedural pain: Secondary | ICD-10-CM | POA: Diagnosis not present

## 2022-03-23 DIAGNOSIS — Z794 Long term (current) use of insulin: Secondary | ICD-10-CM | POA: Diagnosis not present

## 2022-03-23 DIAGNOSIS — E1169 Type 2 diabetes mellitus with other specified complication: Secondary | ICD-10-CM | POA: Diagnosis not present

## 2022-03-23 DIAGNOSIS — E119 Type 2 diabetes mellitus without complications: Secondary | ICD-10-CM | POA: Diagnosis not present

## 2022-03-23 DIAGNOSIS — M1711 Unilateral primary osteoarthritis, right knee: Secondary | ICD-10-CM | POA: Diagnosis not present

## 2022-03-23 DIAGNOSIS — E785 Hyperlipidemia, unspecified: Secondary | ICD-10-CM | POA: Diagnosis not present

## 2022-03-23 DIAGNOSIS — Z96651 Presence of right artificial knee joint: Secondary | ICD-10-CM | POA: Diagnosis not present

## 2022-03-23 DIAGNOSIS — Z471 Aftercare following joint replacement surgery: Secondary | ICD-10-CM | POA: Diagnosis not present

## 2022-03-24 DIAGNOSIS — E1169 Type 2 diabetes mellitus with other specified complication: Secondary | ICD-10-CM | POA: Diagnosis not present

## 2022-03-24 DIAGNOSIS — E785 Hyperlipidemia, unspecified: Secondary | ICD-10-CM | POA: Diagnosis not present

## 2022-03-24 DIAGNOSIS — M1711 Unilateral primary osteoarthritis, right knee: Secondary | ICD-10-CM | POA: Diagnosis not present

## 2022-03-25 DIAGNOSIS — M25561 Pain in right knee: Secondary | ICD-10-CM | POA: Diagnosis not present

## 2022-03-25 DIAGNOSIS — R2689 Other abnormalities of gait and mobility: Secondary | ICD-10-CM | POA: Diagnosis not present

## 2022-04-01 DIAGNOSIS — M2351 Chronic instability of knee, right knee: Secondary | ICD-10-CM | POA: Diagnosis not present

## 2022-04-01 DIAGNOSIS — Z96651 Presence of right artificial knee joint: Secondary | ICD-10-CM | POA: Diagnosis not present

## 2022-04-01 DIAGNOSIS — Z471 Aftercare following joint replacement surgery: Secondary | ICD-10-CM | POA: Diagnosis not present

## 2022-04-01 DIAGNOSIS — M25461 Effusion, right knee: Secondary | ICD-10-CM | POA: Diagnosis not present

## 2022-04-02 DIAGNOSIS — R2689 Other abnormalities of gait and mobility: Secondary | ICD-10-CM | POA: Diagnosis not present

## 2022-04-02 DIAGNOSIS — M25561 Pain in right knee: Secondary | ICD-10-CM | POA: Diagnosis not present

## 2022-04-06 DIAGNOSIS — M25561 Pain in right knee: Secondary | ICD-10-CM | POA: Diagnosis not present

## 2022-04-06 DIAGNOSIS — R2689 Other abnormalities of gait and mobility: Secondary | ICD-10-CM | POA: Diagnosis not present

## 2022-04-08 DIAGNOSIS — R2689 Other abnormalities of gait and mobility: Secondary | ICD-10-CM | POA: Diagnosis not present

## 2022-04-08 DIAGNOSIS — M25561 Pain in right knee: Secondary | ICD-10-CM | POA: Diagnosis not present

## 2022-04-13 DIAGNOSIS — M25561 Pain in right knee: Secondary | ICD-10-CM | POA: Diagnosis not present

## 2022-04-13 DIAGNOSIS — R2689 Other abnormalities of gait and mobility: Secondary | ICD-10-CM | POA: Diagnosis not present

## 2022-04-15 DIAGNOSIS — M25561 Pain in right knee: Secondary | ICD-10-CM | POA: Diagnosis not present

## 2022-04-15 DIAGNOSIS — R2689 Other abnormalities of gait and mobility: Secondary | ICD-10-CM | POA: Diagnosis not present

## 2022-04-17 LAB — HM DIABETES EYE EXAM

## 2022-04-20 DIAGNOSIS — M25561 Pain in right knee: Secondary | ICD-10-CM | POA: Diagnosis not present

## 2022-04-20 DIAGNOSIS — R2689 Other abnormalities of gait and mobility: Secondary | ICD-10-CM | POA: Diagnosis not present

## 2022-05-03 ENCOUNTER — Other Ambulatory Visit: Payer: Self-pay | Admitting: Family Medicine

## 2022-05-04 DIAGNOSIS — R2689 Other abnormalities of gait and mobility: Secondary | ICD-10-CM | POA: Diagnosis not present

## 2022-05-04 DIAGNOSIS — M25561 Pain in right knee: Secondary | ICD-10-CM | POA: Diagnosis not present

## 2022-05-06 DIAGNOSIS — Z96651 Presence of right artificial knee joint: Secondary | ICD-10-CM | POA: Diagnosis not present

## 2022-05-06 DIAGNOSIS — M7989 Other specified soft tissue disorders: Secondary | ICD-10-CM | POA: Diagnosis not present

## 2022-05-06 DIAGNOSIS — Z471 Aftercare following joint replacement surgery: Secondary | ICD-10-CM | POA: Diagnosis not present

## 2022-05-17 DIAGNOSIS — R2689 Other abnormalities of gait and mobility: Secondary | ICD-10-CM | POA: Diagnosis not present

## 2022-05-17 DIAGNOSIS — M25561 Pain in right knee: Secondary | ICD-10-CM | POA: Diagnosis not present

## 2022-05-21 ENCOUNTER — Other Ambulatory Visit: Payer: Self-pay | Admitting: Family Medicine

## 2022-05-21 ENCOUNTER — Other Ambulatory Visit: Payer: Self-pay

## 2022-05-21 DIAGNOSIS — R2689 Other abnormalities of gait and mobility: Secondary | ICD-10-CM | POA: Diagnosis not present

## 2022-05-21 DIAGNOSIS — M25561 Pain in right knee: Secondary | ICD-10-CM | POA: Diagnosis not present

## 2022-05-21 MED ORDER — TRESIBA FLEXTOUCH 100 UNIT/ML ~~LOC~~ SOPN: 5.0000 [IU] | PEN_INJECTOR | Freq: Every day | SUBCUTANEOUS | 0 refills | Status: DC

## 2022-05-24 DIAGNOSIS — M25561 Pain in right knee: Secondary | ICD-10-CM | POA: Diagnosis not present

## 2022-05-24 DIAGNOSIS — R2689 Other abnormalities of gait and mobility: Secondary | ICD-10-CM | POA: Diagnosis not present

## 2022-05-31 DIAGNOSIS — R2689 Other abnormalities of gait and mobility: Secondary | ICD-10-CM | POA: Diagnosis not present

## 2022-05-31 DIAGNOSIS — M25561 Pain in right knee: Secondary | ICD-10-CM | POA: Diagnosis not present

## 2022-06-04 DIAGNOSIS — M25561 Pain in right knee: Secondary | ICD-10-CM | POA: Diagnosis not present

## 2022-06-04 DIAGNOSIS — R2689 Other abnormalities of gait and mobility: Secondary | ICD-10-CM | POA: Diagnosis not present

## 2022-06-07 DIAGNOSIS — R2689 Other abnormalities of gait and mobility: Secondary | ICD-10-CM | POA: Diagnosis not present

## 2022-06-07 DIAGNOSIS — M25561 Pain in right knee: Secondary | ICD-10-CM | POA: Diagnosis not present

## 2022-06-11 DIAGNOSIS — M25561 Pain in right knee: Secondary | ICD-10-CM | POA: Diagnosis not present

## 2022-06-11 DIAGNOSIS — R2689 Other abnormalities of gait and mobility: Secondary | ICD-10-CM | POA: Diagnosis not present

## 2022-06-14 DIAGNOSIS — R2689 Other abnormalities of gait and mobility: Secondary | ICD-10-CM | POA: Diagnosis not present

## 2022-06-14 DIAGNOSIS — M25561 Pain in right knee: Secondary | ICD-10-CM | POA: Diagnosis not present

## 2022-06-18 DIAGNOSIS — M25561 Pain in right knee: Secondary | ICD-10-CM | POA: Diagnosis not present

## 2022-06-18 DIAGNOSIS — R2689 Other abnormalities of gait and mobility: Secondary | ICD-10-CM | POA: Diagnosis not present

## 2022-07-07 ENCOUNTER — Other Ambulatory Visit: Payer: Self-pay | Admitting: Family Medicine

## 2022-07-07 ENCOUNTER — Encounter: Payer: Self-pay | Admitting: Family Medicine

## 2022-07-07 ENCOUNTER — Ambulatory Visit (INDEPENDENT_AMBULATORY_CARE_PROVIDER_SITE_OTHER): Payer: BC Managed Care – PPO | Admitting: Family Medicine

## 2022-07-07 VITALS — BP 120/81 | HR 97 | Ht 64.5 in | Wt 248.0 lb

## 2022-07-07 DIAGNOSIS — Z1231 Encounter for screening mammogram for malignant neoplasm of breast: Secondary | ICD-10-CM | POA: Diagnosis not present

## 2022-07-07 DIAGNOSIS — G43009 Migraine without aura, not intractable, without status migrainosus: Secondary | ICD-10-CM

## 2022-07-07 DIAGNOSIS — Z23 Encounter for immunization: Secondary | ICD-10-CM

## 2022-07-07 DIAGNOSIS — R011 Cardiac murmur, unspecified: Secondary | ICD-10-CM | POA: Diagnosis not present

## 2022-07-07 DIAGNOSIS — E1165 Type 2 diabetes mellitus with hyperglycemia: Secondary | ICD-10-CM

## 2022-07-07 DIAGNOSIS — R110 Nausea: Secondary | ICD-10-CM

## 2022-07-07 LAB — POCT GLYCOSYLATED HEMOGLOBIN (HGB A1C): HbA1c, POC (controlled diabetic range): 8.8 % — AB (ref 0.0–7.0)

## 2022-07-07 MED ORDER — RIZATRIPTAN BENZOATE 10 MG PO TBDP: ORAL_TABLET | ORAL | 3 refills | Status: DC

## 2022-07-07 MED ORDER — ONDANSETRON 8 MG PO TBDP
ORAL_TABLET | ORAL | 0 refills | Status: DC
Start: 2022-07-07 — End: 2022-10-08

## 2022-07-07 MED ORDER — SEMAGLUTIDE (2 MG/DOSE) 8 MG/3ML ~~LOC~~ SOPN: 2.0000 mg | PEN_INJECTOR | SUBCUTANEOUS | 3 refills | Status: DC

## 2022-07-07 NOTE — Progress Notes (Signed)
Established patient visit   Patient: Morgan Duran   DOB: 03-05-1966   57 y.o. Female  MRN: [id number] Visit Date: 07/07/2022  Today's healthcare provider: Owens Loffler, DO   Chief Complaint  Patient presents with   Diabetes    SUBJECTIVE    Chief Complaint  Patient presents with   Diabetes   HPI  T2DM - A1C 6.9% in November (6.4% from outside source in December) - diet and exercise   Migraines  - refill on meds; zofran, maxalt  - happening more frequently   Heart murmur - pt has heart murmur and needs follow up echo per chart review  Review of Systems  Constitutional:  Negative for activity change, fatigue and fever.  Respiratory:  Negative for cough and shortness of breath.   Cardiovascular:  Negative for chest pain.  Gastrointestinal:  Negative for abdominal pain.  Genitourinary:  Negative for difficulty urinating.       Current Meds  Medication Sig   albuterol (VENTOLIN HFA) 108 (90 Base) MCG/ACT inhaler Inhale 2 puffs into the lungs every 6 (six) hours as needed.   atorvastatin (LIPITOR) 40 MG tablet Take 1 tablet (40 mg total) by mouth daily.   celecoxib (CELEBREX) 200 MG capsule Take 200 mg by mouth 2 (two) times daily.   glucose blood (ONE TOUCH ULTRA TEST) test strip USE AS INSTRUCTED   insulin detemir (LEVEMIR) 100 UNIT/ML injection Inject 5 Units into the skin daily.   Insulin Syringe-Needle U-100 (INSULIN SYRINGE .3CC/31GX5/16") 31G X 5/16" 0.3 ML MISC 1 Syringe by Does not apply route daily as needed.   metFORMIN (GLUCOPHAGE) 1000 MG tablet Take 1 tablet (1,000 mg total) by mouth 2 (two) times daily with a meal.   methocarbamol (ROBAXIN) 500 MG tablet Take 500 mg by mouth 4 (four) times daily.   Multiple Vitamins-Minerals (MULTIVITAMIN WOMEN PO) Take by mouth.   Omega-3 Fatty Acids (FISH OIL PO) Take by mouth daily.   oxyCODONE-acetaminophen (PERCOCET/ROXICET) 5-325 MG tablet 1-2 tablets every 6 (six) hours as needed for moderate pain.    Semaglutide, 2 MG/DOSE, 8 MG/3ML SOPN Inject 2 mg as directed once a week.   senna-docusate (SENOKOT-S) 8.6-50 MG tablet Take by mouth.   [DISCONTINUED] ondansetron (ZOFRAN-ODT) 8 MG disintegrating tablet PLACE 1 TABLET ON THE TONGUE EVERY 8 HOURS AS NEEDED FOR NAUSEA OR VOMITING   [DISCONTINUED] rizatriptan (MAXALT-MLT) 10 MG disintegrating tablet PLACE 1 TABLET ON TONGUE AT 1ST SIGN OF MIGRAINE, MAY REPEAT EVERY 2 HOURS AS NEEDED 2 TIMES. DO NOT EXCEED 3 TABS. IN 24 HOURS   [DISCONTINUED] Semaglutide, 1 MG/DOSE, (OZEMPIC, 1 MG/DOSE,) 4 MG/3ML SOPN INJECT 1 MG WEEKLY AS DIRECTED    OBJECTIVE    BP 120/81 (BP Location: Left Arm, Patient Position: Sitting, Cuff Size: Large)   Pulse 97   Ht 5' 4.5" (1.638 m)   Wt 248 lb (112.5 kg)   SpO2 97%   BMI 41.91 kg/m   Physical Exam Vitals and nursing note reviewed.  Constitutional:      General: She is not in acute distress.    Appearance: Normal appearance.  HENT:     Head: Normocephalic and atraumatic.     Right Ear: External ear normal.     Left Ear: External ear normal.     Nose: Nose normal.  Eyes:     Conjunctiva/sclera: Conjunctivae normal.  Cardiovascular:     Rate and Rhythm: Normal rate.  Pulmonary:     Effort: Pulmonary effort is normal.  Breath sounds: Normal breath sounds.  Neurological:     General: No focal deficit present.     Mental Status: She is alert and oriented to person, place, and time.  Psychiatric:        Mood and Affect: Mood normal.        Behavior: Behavior normal.        Thought Content: Thought content normal.        Judgment: Judgment normal.       ASSESSMENT & PLAN    Problem List Items Addressed This Visit       Cardiovascular and Mediastinum   Migraine without aura and without status migrainosus, not intractable    - refill zofran for nausea - refill maxalt  - referral to neurology to discuss injections vs botox      Relevant Medications   celecoxib (CELEBREX) 200 MG capsule    oxyCODONE-acetaminophen (PERCOCET/ROXICET) 5-325 MG tablet   methocarbamol (ROBAXIN) 500 MG tablet   rizatriptan (MAXALT-MLT) 10 MG disintegrating tablet   Other Relevant Orders   Ambulatory referral to Neurology     Endocrine   Uncontrolled type 2 diabetes mellitus with hyperglycemia - Primary    - POC A1C 8.8 have increased ozempic to 2mg  dosing - she has gained weight since surgery which is understandable with limited movement. We discussed increasing exercise      Relevant Medications   Semaglutide, 2 MG/DOSE, 8 MG/3ML SOPN   insulin detemir (LEVEMIR) 100 UNIT/ML injection   Other Relevant Orders   POCT HgB A1C (Completed)   COMPLETE METABOLIC PANEL WITH GFR   Microalbumin / creatinine urine ratio   Other Visit Diagnoses     Encounter for screening mammogram for malignant neoplasm of breast       Relevant Orders   MM DIGITAL SCREENING BILATERAL   Murmur, cardiac       Relevant Orders   ECHOCARDIOGRAM COMPLETE   Nausea       Relevant Medications   ondansetron (ZOFRAN-ODT) 8 MG disintegrating tablet   Need for Tdap vaccination       Relevant Orders   Tdap vaccine greater than or equal to 7yo IM (Completed)       Return in about 3 months (around 10/06/2022).      Meds ordered this encounter  Medications   Semaglutide, 2 MG/DOSE, 8 MG/3ML SOPN    Sig: Inject 2 mg as directed once a week.    Dispense:  3 mL    Refill:  3   ondansetron (ZOFRAN-ODT) 8 MG disintegrating tablet    Sig: PLACE 1 TABLET ON THE TONGUE EVERY 8 HOURS AS NEEDED FOR NAUSEA OR VOMITING    Dispense:  30 tablet    Refill:  0   rizatriptan (MAXALT-MLT) 10 MG disintegrating tablet    Sig: PLACE 1 TABLET ON TONGUE AT 1ST SIGN OF MIGRAINE, MAY REPEAT EVERY 2 HOURS AS NEEDED 2 TIMES. DO NOT EXCEED 3 TABS. IN 24 HOURS    Dispense:  20 tablet    Refill:  3    Orders Placed This Encounter  Procedures   MM DIGITAL SCREENING BILATERAL    Standing Status:   Future    Standing Expiration Date:    07/07/2023    Order Specific Question:   Is the patient pregnant?    Answer:   No    Order Specific Question:   Preferred imaging location?    Answer:   [facility]    Order Specific Question:  Reason for exam:    Answer:   screening for breast cancer    Order Specific Question:   Release to patient    Answer:   Immediate   Tdap vaccine greater than or equal to 7yo IM   COMPLETE METABOLIC PANEL WITH GFR   Microalbumin / creatinine urine ratio   Ambulatory referral to Neurology    Referral Priority:   Routine    Referral Type:   Consultation    Referral Reason:   Specialty Services Required    Requested Specialty:   Neurology    Number of Visits Requested:   1   POCT HgB A1C   ECHOCARDIOGRAM COMPLETE    Standing Status:   Future    Standing Expiration Date:   07/07/2023    Order Specific Question:   Where should this test be performed    Answer:   [place]    Order Specific Question:   Perflutren DEFINITY (image enhancing agent) should be administered unless hypersensitivity or allergy exist    Answer:   Administer Perflutren    Order Specific Question:   Reason for exam-Echo    Answer:   Murmur R01.1     Owens Loffler, DO  [place] [facility] [phone] (phone) [phone] (fax)  [place]

## 2022-07-07 NOTE — Assessment & Plan Note (Signed)
-   POC A1C 8.8 have increased ozempic to 2mg  dosing - she has gained weight since surgery which is understandable with limited movement. We discussed increasing exercise

## 2022-07-07 NOTE — Assessment & Plan Note (Signed)
-   refill zofran for nausea - refill maxalt  - referral to neurology to discuss injections vs botox

## 2022-07-08 DIAGNOSIS — M25461 Effusion, right knee: Secondary | ICD-10-CM | POA: Diagnosis not present

## 2022-07-08 DIAGNOSIS — Z471 Aftercare following joint replacement surgery: Secondary | ICD-10-CM | POA: Diagnosis not present

## 2022-07-08 DIAGNOSIS — Z96651 Presence of right artificial knee joint: Secondary | ICD-10-CM | POA: Diagnosis not present

## 2022-07-08 DIAGNOSIS — R29898 Other symptoms and signs involving the musculoskeletal system: Secondary | ICD-10-CM | POA: Diagnosis not present

## 2022-07-08 LAB — COMPLETE METABOLIC PANEL WITH GFR
AG Ratio: 1.6 (calc) (ref 1.0–2.5)
ALT: 14 U/L (ref 6–29)
AST: 13 U/L (ref 10–35)
Albumin: 4.4 g/dL (ref 3.6–5.1)
Alkaline phosphatase (APISO): 100 U/L (ref 37–153)
BUN: 24 mg/dL (ref 7–25)
CO2: 28 mmol/L (ref 20–32)
Calcium: 10.3 mg/dL (ref 8.6–10.4)
Chloride: 102 mmol/L (ref 98–110)
Creat: 0.89 mg/dL (ref 0.50–1.03)
Globulin: 2.7 g/dL (calc) (ref 1.9–3.7)
Glucose, Bld: 198 mg/dL — ABNORMAL HIGH (ref 65–99)
Potassium: 4.5 mmol/L (ref 3.5–5.3)
Sodium: 140 mmol/L (ref 135–146)
Total Bilirubin: 0.4 mg/dL (ref 0.2–1.2)
Total Protein: 7.1 g/dL (ref 6.1–8.1)
eGFR: 77 mL/min/{1.73_m2} (ref >=60)

## 2022-07-08 LAB — MICROALBUMIN / CREATININE URINE RATIO
Creatinine, Urine: 276 mg/dL — ABNORMAL HIGH (ref 20–275)
Microalb Creat Ratio: 14 mg/g creat (ref <30)
Microalb, Ur: 4 mg/dL

## 2022-07-14 ENCOUNTER — Encounter: Payer: Self-pay | Admitting: Family Medicine

## 2022-07-14 ENCOUNTER — Other Ambulatory Visit: Payer: Self-pay | Admitting: Family Medicine

## 2022-07-14 DIAGNOSIS — E1165 Type 2 diabetes mellitus with hyperglycemia: Secondary | ICD-10-CM

## 2022-07-14 MED ORDER — SEMAGLUTIDE (2 MG/DOSE) 8 MG/3ML ~~LOC~~ SOPN
2.0000 mg | PEN_INJECTOR | SUBCUTANEOUS | 10 refills | Status: DC
Start: 2022-07-14 — End: 2023-11-16

## 2022-08-11 ENCOUNTER — Ambulatory Visit (INDEPENDENT_AMBULATORY_CARE_PROVIDER_SITE_OTHER): Payer: BC Managed Care – PPO

## 2022-08-11 DIAGNOSIS — Z1231 Encounter for screening mammogram for malignant neoplasm of breast: Secondary | ICD-10-CM | POA: Diagnosis not present

## 2022-08-24 ENCOUNTER — Ambulatory Visit (HOSPITAL_COMMUNITY)
Admission: RE | Admit: 2022-08-24 | Discharge: 2022-08-24 | Disposition: A | Discharge status: Home / Self Care | Payer: BC Managed Care – PPO | Source: Ambulatory Visit | Attending: Family Medicine | Admitting: Family Medicine

## 2022-08-24 DIAGNOSIS — R011 Cardiac murmur, unspecified: Secondary | ICD-10-CM | POA: Diagnosis not present

## 2022-08-24 NOTE — Progress Notes (Signed)
Echocardiogram 2D Echocardiogram has been performed.  Kimley Apsey L Lennie Vasco 08/24/2022, 4:06 PM 

## 2022-08-24 NOTE — Progress Notes (Signed)
Echocardiogram 2D Echocardiogram has been performed.  Morgan Duran 08/24/2022, 4:06 PM

## 2022-08-31 ENCOUNTER — Encounter: Payer: Self-pay | Admitting: Family Medicine

## 2022-08-31 LAB — ECHOCARDIOGRAM COMPLETE
AR max vel: 2.03 cm2
AV Area VTI: 1.65 cm2
AV Area mean vel: 2.31 cm2
AV Mean grad: 8.3 mmHg
AV Peak grad: 20 mmHg
Ao pk vel: 2.23 m/s
Area-P 1/2: 3.74 cm2
Calc EF: 66.4 %
S' Lateral: 2.5 cm
Single Plane A2C EF: 65 %
Single Plane A4C EF: 69.8 %

## 2022-09-20 ENCOUNTER — Ambulatory Visit: Payer: BC Managed Care – PPO | Admitting: Diagnostic Neuroimaging

## 2022-10-06 ENCOUNTER — Ambulatory Visit: Payer: BC Managed Care – PPO | Admitting: Family Medicine

## 2022-10-08 ENCOUNTER — Ambulatory Visit (INDEPENDENT_AMBULATORY_CARE_PROVIDER_SITE_OTHER): Payer: BC Managed Care – PPO | Admitting: Family Medicine

## 2022-10-08 ENCOUNTER — Encounter: Payer: Self-pay | Admitting: Family Medicine

## 2022-10-08 VITALS — BP 126/80 | HR 88 | Ht 64.0 in | Wt 260.0 lb

## 2022-10-08 DIAGNOSIS — E1165 Type 2 diabetes mellitus with hyperglycemia: Secondary | ICD-10-CM | POA: Diagnosis not present

## 2022-10-08 DIAGNOSIS — Z7985 Long-term (current) use of injectable non-insulin antidiabetic drugs: Secondary | ICD-10-CM

## 2022-10-08 DIAGNOSIS — R110 Nausea: Secondary | ICD-10-CM | POA: Diagnosis not present

## 2022-10-08 DIAGNOSIS — G43009 Migraine without aura, not intractable, without status migrainosus: Secondary | ICD-10-CM | POA: Diagnosis not present

## 2022-10-08 LAB — POCT GLYCOSYLATED HEMOGLOBIN (HGB A1C): HbA1c, POC (controlled diabetic range): 10.7 % — AB (ref 0.0–7.0)

## 2022-10-08 MED ORDER — ONDANSETRON 8 MG PO TBDP: ORAL_TABLET | ORAL | 0 refills | Status: DC

## 2022-10-08 MED ORDER — RIZATRIPTAN BENZOATE 10 MG PO TBDP
ORAL_TABLET | ORAL | 3 refills | Status: DC
Start: 2022-10-08 — End: 2022-12-08

## 2022-10-08 NOTE — Assessment & Plan Note (Signed)
Point-of-care A1c is elevated at 10.7 today.  Discussed with patient about treatment options.  We made a plan to do Ozempic 1 mg with hopes of increasing slowly over the next 3 months.  If the A1c is no better in 3 months with diet and exercise changes as well as increasing the Ozempic we will have to go back to nightly insulin.  Patient is very agreeable to plan.

## 2022-10-08 NOTE — Assessment & Plan Note (Signed)
Patient had had an increase in migraines.  She does have an appointment with neurology in August but does need a refill on her Maxalt.  I will go ahead and refill today

## 2022-10-08 NOTE — Progress Notes (Signed)
Established patient visit   Patient: Morgan Duran   DOB: 06-15-1965   57 y.o. Female  MRN: [id number] Visit Date: 10/08/2022  Today's healthcare provider: Charlton Amor, DO   Chief Complaint  Patient presents with   Follow-up    Dm check    SUBJECTIVE    Chief Complaint  Patient presents with   Follow-up    Dm check   HPI   T2DM - on ozempic 1mg   -Having side effects of nausea from the Ozempic.  She does note that using some Zofran will help the side effects.  Need more refills on Zofran.  Review of Systems  Constitutional:  Negative for activity change, fatigue and fever.  Respiratory:  Negative for cough and shortness of breath.   Cardiovascular:  Negative for chest pain.  Gastrointestinal:  Negative for abdominal pain.  Genitourinary:  Negative for difficulty urinating.       No outpatient medications have been marked as taking for the 10/08/22 encounter (Office Visit) with Charlton Amor, DO.    OBJECTIVE    BP 126/80   Pulse 88   Ht 5\' 4"  (1.626 m)   Wt 260 lb (117.9 kg)   SpO2 98%   BMI 44.63 kg/m   Physical Exam Vitals and nursing note reviewed.  Constitutional:      General: She is not in acute distress.    Appearance: Normal appearance.  HENT:     Head: Normocephalic and atraumatic.     Right Ear: External ear normal.     Left Ear: External ear normal.     Nose: Nose normal.  Eyes:     Conjunctiva/sclera: Conjunctivae normal.  Cardiovascular:     Rate and Rhythm: Normal rate and regular rhythm.  Pulmonary:     Effort: Pulmonary effort is normal.     Breath sounds: Normal breath sounds.  Neurological:     General: No focal deficit present.     Mental Status: She is alert and oriented to person, place, and time.  Psychiatric:        Mood and Affect: Mood normal.        Behavior: Behavior normal.        Thought Content: Thought content normal.        Judgment: Judgment normal.          ASSESSMENT & PLAN    Problem List Items  Addressed This Visit       Cardiovascular and Mediastinum   Migraine without aura and without status migrainosus, not intractable    Patient had had an increase in migraines.  She does have an appointment with neurology in August but does need a refill on her Maxalt.  I will go ahead and refill today      Relevant Medications   rizatriptan (MAXALT-MLT) 10 MG disintegrating tablet     Endocrine   Uncontrolled type 2 diabetes mellitus with hyperglycemia (HCC) - Primary    Point-of-care A1c is elevated at 10.7 today.  Discussed with patient about treatment options.  We made a plan to do Ozempic 1 mg with hopes of increasing slowly over the next 3 months.  If the A1c is no better in 3 months with diet and exercise changes as well as increasing the Ozempic we will have to go back to nightly insulin.  Patient is very agreeable to plan.      Relevant Orders   POCT HgB A1C (Completed)   Other Visit Diagnoses  Nausea       Relevant Medications   ondansetron (ZOFRAN-ODT) 8 MG disintegrating tablet       Return in about 3 months (around 01/08/2023).      Meds ordered this encounter  Medications   ondansetron (ZOFRAN-ODT) 8 MG disintegrating tablet    Sig: PLACE 1 TABLET ON THE TONGUE EVERY 8 HOURS AS NEEDED FOR NAUSEA OR VOMITING    Dispense:  30 tablet    Refill:  0   rizatriptan (MAXALT-MLT) 10 MG disintegrating tablet    Sig: PLACE 1 TABLET ON TONGUE AT 1ST SIGN OF MIGRAINE, MAY REPEAT EVERY 2 HOURS AS NEEDED 2 TIMES. DO NOT EXCEED 3 TABS. IN 24 HOURS    Dispense:  20 tablet    Refill:  3    Orders Placed This Encounter  Procedures   POCT HgB A1C     Charlton Amor, DO  [facility] at [facility] [phone] (phone) [phone] (fax)  [facility]

## 2022-10-18 ENCOUNTER — Other Ambulatory Visit: Payer: Self-pay | Admitting: Sports Medicine

## 2022-11-10 ENCOUNTER — Other Ambulatory Visit: Payer: Self-pay | Admitting: Family Medicine

## 2022-11-19 ENCOUNTER — Ambulatory Visit (INDEPENDENT_AMBULATORY_CARE_PROVIDER_SITE_OTHER): Payer: BC Managed Care – PPO | Admitting: Neurology

## 2022-11-19 ENCOUNTER — Encounter: Payer: Self-pay | Admitting: Neurology

## 2022-11-19 ENCOUNTER — Other Ambulatory Visit: Payer: Self-pay | Admitting: Neurology

## 2022-11-19 VITALS — BP 129/85 | HR 80 | Ht 64.0 in | Wt 255.0 lb

## 2022-11-19 DIAGNOSIS — G43709 Chronic migraine without aura, not intractable, without status migrainosus: Secondary | ICD-10-CM | POA: Diagnosis not present

## 2022-11-19 DIAGNOSIS — R110 Nausea: Secondary | ICD-10-CM | POA: Diagnosis not present

## 2022-11-19 MED ORDER — ONDANSETRON 4 MG PO TBDP
4.0000 mg | ORAL_TABLET | Freq: Three times a day (TID) | ORAL | 6 refills | Status: DC | PRN
Start: 2022-11-19 — End: 2023-04-19

## 2022-11-19 MED ORDER — SUMATRIPTAN SUCCINATE 6 MG/0.5ML ~~LOC~~ SOLN: 6.0000 mg | SUBCUTANEOUS | 11 refills | Status: DC | PRN

## 2022-11-19 MED ORDER — TIZANIDINE HCL 4 MG PO TABS: 4.0000 mg | ORAL_TABLET | Freq: Four times a day (QID) | ORAL | 6 refills | Status: AC | PRN

## 2022-11-19 MED ORDER — AIMOVIG 70 MG/ML ~~LOC~~ SOAJ: 70.0000 mg | SUBCUTANEOUS | 11 refills | Status: DC

## 2022-11-19 NOTE — Progress Notes (Signed)
Chief Complaint  Patient presents with   New Patient (Initial Visit)    Rm 14, pt alone, she states from July 2003 she has suffered with migraines. Has ben worsening. She has cluster can have them up to three times a day three times a week.. had a allergy skin test and it was negative. Triggers are stress, strong perfume. She can have vomitting associated and the zofran helps. Sometimes wakes up with migraines. Pt is interested in botox   Other    Tried and failed topiramate, propranolol, lamotrigine, amitriptyline. These have not been helping.  She has used sumatriptan for abortive and more currently maxalt which helps.      ASSESSMENT AND PLAN  Morgan Duran is a 57 y.o. female   Chronic migraine Obesity Obstructive sleep apnea  Start preventive medication aimovig 70 mg monthly  Suboptimal response to Maxalt dissolvable as needed, add on Imitrex subcutaneous injection for rescue treatment, may combine with tizanidine, Aleve, Zofran for prolonged severe headache  Return To Clinic With NP In 6 Months   DIAGNOSTIC DATA (LABS, IMAGING, TESTING) - I reviewed patient records, labs, notes, testing and imaging myself where available.   MEDICAL HISTORY:  Morgan Duran, is a 57 year old female seen in request by her primary care doctor Morey Hummingbird for evaluation of chronic migraine, initial evaluation November 19, 2022   I reviewed and summarized the referring note. PMHX Chronic migraine DM Obesity HLD OSA    She started to have migraines since 2003, typical migraine are retro-orbital area severe pounding headache with light, noise, smell sensitivity, nauseous, lasting for hours, often extending to her neck region, occasionally during intense headache she also described difficulty thinking, word finding difficulties, slurred speech  Her migraine usually cost her around her menstruation, she went through menopause, still have frequent headaches, 1-2 times each week at least, other  trigger for her migraine are weather change, certain heavy starchy food, cheese, stress, sleep deprivation, strong smells,  Over the years, she has tried different preventive medications with suboptimal response such as propranolol, lamotrigine, amitriptyline, Topamax,  She tried Imitrex tablets in the past, did not help her headache much, over the years, she was given Maxalt dissolvable, helped her a 50% of the time, often limited because of her severe nausea, vomiting, sometimes she woke up with severe headache, also had to go to urgent care for cocktail,   PHYSICAL EXAM:   Vitals:   11/19/22 1056  BP: 129/85  Pulse: 80  Weight: 255 lb (115.7 kg)  Height: 5\' 4"  (1.626 m)    Body mass index is 43.77 kg/m.  PHYSICAL EXAMNIATION:  Gen: NAD, conversant, well nourised, well groomed                     Cardiovascular: Regular rate rhythm, no peripheral edema, warm, nontender. Eyes: Conjunctivae clear without exudates or hemorrhage Neck: Supple, no carotid bruits. Pulmonary: Clear to auscultation bilaterally   NEUROLOGICAL EXAM:  MENTAL STATUS: Speech/cognition: Obese, awake, alert, oriented to history taking and casual conversation CRANIAL NERVES: CN II: Visual fields are full to confrontation. Pupils are round equal and briskly reactive to light. CN III, IV, VI: extraocular movement are normal. No ptosis. CN V: Facial sensation is intact to light touch CN VII: Face is symmetric with normal eye closure  CN VIII: Hearing is normal to causal conversation. CN IX, X: Phonation is normal. CN XI: Head turning and shoulder shrug are intact CN XII: Narrow oropharyngeal space  MOTOR: There  is no pronator drift of out-stretched arms. Muscle bulk and tone are normal. Muscle strength is normal.  REFLEXES: Reflexes are 2+ and symmetric at the biceps, triceps, knees, and ankles. Plantar responses are flexor.  SENSORY: Intact to light touch, pinprick and vibratory sensation are intact  in fingers and toes.  COORDINATION: There is no trunk or limb dysmetria noted.  GAIT/STANCE: Posture is normal. Gait is steady  REVIEW OF SYSTEMS:  Full 14 system review of systems performed and notable only for as above All other review of systems were negative.   ALLERGIES: No Known Allergies  HOME MEDICATIONS: Current Outpatient Medications  Medication Sig Dispense Refill   atorvastatin (LIPITOR) 40 MG tablet TAKE 1 TABLET BY MOUTH EVERY DAY 30 tablet 2   glucose blood (ONE TOUCH ULTRA TEST) test strip USE AS INSTRUCTED 100 each 1   Insulin Syringe-Needle U-100 (INSULIN SYRINGE .3CC/31GX5/16") 31G X 5/16" 0.3 ML MISC 1 Syringe by Does not apply route daily as needed. 90 each 3   Melatonin 10 MG TABS Take 30 mg by mouth at bedtime.     metFORMIN (GLUCOPHAGE) 1000 MG tablet Take 1 tablet (1,000 mg total) by mouth 2 (two) times daily with a meal. 180 tablet 3   Multiple Vitamins-Minerals (MULTIVITAMIN WOMEN PO) Take by mouth.     Omega-3 Fatty Acids (FISH OIL PO) Take by mouth daily.     ondansetron (ZOFRAN-ODT) 8 MG disintegrating tablet PLACE 1 TABLET ON THE TONGUE EVERY 8 HOURS AS NEEDED FOR NAUSEA OR VOMITING 30 tablet 0   OVER THE COUNTER MEDICATION Take 60 mLs by mouth at bedtime.     rizatriptan (MAXALT-MLT) 10 MG disintegrating tablet PLACE 1 TABLET ON TONGUE AT 1ST SIGN OF MIGRAINE, MAY REPEAT EVERY 2 HOURS AS NEEDED 2 TIMES. DO NOT EXCEED 3 TABS. IN 24 HOURS 20 tablet 3   Semaglutide, 2 MG/DOSE, 8 MG/3ML SOPN Inject 2 mg as directed once a week. 3 mL 10   No current facility-administered medications for this visit.    PAST MEDICAL HISTORY: Past Medical History:  Diagnosis Date   Anxiety    Arthritis    Benign cyst of left breast 04/09/2019   Diabetes mellitus without complication (HCC)    type 2   Heart murmur    at birth, no problems   Hyperlipidemia    Hypertension    Migraine    Morbid obesity (HCC)    OSA on CPAP    Sleep apnea    uses cpap nightly     PAST SURGICAL HISTORY: Past Surgical History:  Procedure Laterality Date   COLONOSCOPY  2003   [place] - Normal   KNEE ARTHROSCOPY Right    NASAL SEPTUM SURGERY     SHOULDER ARTHROSCOPY Left    TONSILLECTOMY     UPPER GASTROINTESTINAL ENDOSCOPY  2003   in [place] - Normal    FAMILY HISTORY: Family History  Problem Relation Age of Onset   Emphysema Mother    Diabetes Father    Colon polyps Neg Hx    Rectal cancer Neg Hx    Stomach cancer Neg Hx     SOCIAL HISTORY: Social History   Socioeconomic History   Marital status: Married    Spouse name: Not on file   Number of children: Not on file   Years of education: Not on file   Highest education level: Associate degree: occupational, Scientist, product/process development, or vocational program  Occupational History   Not on file  Tobacco Use  Smoking status: Never   Smokeless tobacco: Never  Vaping Use   Vaping status: Never Used  Substance and Sexual Activity   Alcohol use: Not Currently   Drug use: Never   Sexual activity: Not Currently    Birth control/protection: Abstinence  Other Topics Concern   Not on file  Social History Narrative   Not on file   Social Determinants of Health   Financial Resource Strain: Low Risk  (07/06/2022)   Overall Financial Resource Strain (CARDIA)    Difficulty of Paying Living Expenses: Not hard at all  Food Insecurity: No Food Insecurity (07/06/2022)   Hunger Vital Sign    Worried About Running Out of Food in the Last Year: Never true    Ran Out of Food in the Last Year: Never true  Transportation Needs: No Transportation Needs (07/06/2022)   PRAPARE - Administrator, Civil Service (Medical): No    Lack of Transportation (Non-Medical): No  Physical Activity: Unknown (07/06/2022)   Exercise Vital Sign    Days of Exercise per Week: 0 days    Minutes of Exercise per Session: Not on file  Stress: No Stress Concern Present (07/06/2022)   [employer] of Occupational Health - Occupational Stress  Questionnaire    Feeling of Stress : Not at all  Social Connections: Moderately Integrated (07/06/2022)   Social Connection and Isolation Panel [NHANES]    Frequency of Communication with Friends and Family: More than three times a week    Frequency of Social Gatherings with Friends and Family: Never    Attends Religious Services: 1 to 4 times per year    Active Member of [employer] or Organizations: No    Attends Banker Meetings: Not on file    Marital Status: Married  Intimate Partner Violence: Low Risk  (03/23/2022)   Received from [facility] visits prior to 06/05/2022., [facility] visits prior to 06/05/2022.   Safety    How often does anyone, including family and friends, physically hurt you?: Never    How often does anyone, including family and friends, insult or talk down to you?: Never    How often does anyone, including family and friends, threaten you with harm?: Never    How often does anyone, including family and friends, scream or curse at you?: Never      Levert Feinstein, M.D. Ph.D.  [facility] [address], [address] [place], [place] [address] Ph: [phone] Fax: [phone]  CC:  Charlton Amor, DO [address] [place],  [place] [address]  Charlton Amor, DO

## 2022-11-20 ENCOUNTER — Encounter: Payer: Self-pay | Admitting: Neurology

## 2022-11-22 ENCOUNTER — Telehealth: Payer: Self-pay

## 2022-11-22 NOTE — Telephone Encounter (Signed)
Pa needed for sumatriptan and aimovig

## 2022-11-23 ENCOUNTER — Telehealth: Payer: Self-pay | Admitting: Pharmacy Technician

## 2022-11-23 ENCOUNTER — Other Ambulatory Visit (HOSPITAL_COMMUNITY): Payer: Self-pay

## 2022-11-23 NOTE — Telephone Encounter (Signed)
Sumatriptan no PA needed per test claim has a $10.00 copay  Aimovig: PA request has been Submitted. New Encounter created for follow up. For additional info see Pharmacy Prior Auth telephone encounter from 11/23/2022.

## 2022-11-23 NOTE — Telephone Encounter (Signed)
Pharmacy Patient Advocate Encounter  Received notification from [facility] that Prior Authorization for Aimovig 70MG /ML auto-injectors has been APPROVED from 11/23/2022 to 11/23/2023. Ran test claim, Copay is $120.00. This test claim was processed through [facility]- copay amounts may vary at other pharmacies due to pharmacy/plan contracts, or as the patient moves through the different stages of their insurance plan.   PA #/Case ID/Reference #: [id number] Key: [id number]

## 2022-11-25 ENCOUNTER — Other Ambulatory Visit: Payer: Self-pay

## 2022-11-25 MED ORDER — AIMOVIG 70 MG/ML ~~LOC~~ SOAJ: 70.0000 mg | SUBCUTANEOUS | 11 refills | Status: DC

## 2022-11-25 MED ORDER — SUMATRIPTAN SUCCINATE 6 MG/0.5ML ~~LOC~~ SOLN: 6.0000 mg | SUBCUTANEOUS | 11 refills | Status: DC | PRN

## 2022-11-28 ENCOUNTER — Other Ambulatory Visit: Payer: Self-pay | Admitting: Family Medicine

## 2022-11-28 DIAGNOSIS — E1121 Type 2 diabetes mellitus with diabetic nephropathy: Secondary | ICD-10-CM

## 2022-11-30 ENCOUNTER — Other Ambulatory Visit: Payer: Self-pay

## 2022-11-30 MED ORDER — AIMOVIG 70 MG/ML ~~LOC~~ SOAJ: 70.0000 mg | SUBCUTANEOUS | 4 refills | Status: DC

## 2022-12-08 ENCOUNTER — Encounter: Payer: Self-pay | Admitting: Family Medicine

## 2022-12-08 ENCOUNTER — Telehealth (INDEPENDENT_AMBULATORY_CARE_PROVIDER_SITE_OTHER): Payer: BC Managed Care – PPO | Admitting: Family Medicine

## 2022-12-08 VITALS — Ht 64.0 in | Wt 255.0 lb

## 2022-12-08 DIAGNOSIS — R051 Acute cough: Secondary | ICD-10-CM

## 2022-12-08 DIAGNOSIS — R0981 Nasal congestion: Secondary | ICD-10-CM | POA: Insufficient documentation

## 2022-12-08 MED ORDER — METHYLPREDNISOLONE 4 MG PO TBPK: ORAL_TABLET | ORAL | 0 refills | Status: DC

## 2022-12-08 MED ORDER — ALBUTEROL SULFATE HFA 108 (90 BASE) MCG/ACT IN AERS: 2.0000 | INHALATION_SPRAY | Freq: Four times a day (QID) | RESPIRATORY_TRACT | 0 refills | Status: DC | PRN

## 2022-12-08 MED ORDER — HYDROCOD POLI-CHLORPHE POLI ER 10-8 MG/5ML PO SUER: 5.0000 mL | Freq: Two times a day (BID) | ORAL | 0 refills | Status: DC | PRN

## 2022-12-08 NOTE — Assessment & Plan Note (Signed)
-   will go ahead and send in prednisone and albuterol  - tussionex for cough given - encouraged pt to take home covid test and report back because with her pmh she would be eligible for paxlovid - if no better by Friday advised she would need to come in and be re-evaluated and get a cxr

## 2022-12-08 NOTE — Progress Notes (Signed)
   Acute Office Visit  Subjective:     Patient ID: Morgan Duran, female    DOB: 27-Feb-1966, 57 y.o.   MRN: [id number]  No chief complaint on file.  I connected with  Marin Shutter on 12/08/22 by a video and audio enabled telemedicine application and verified that I am speaking with the correct person using two identifiers.  Patient Location: Home  Provider Location: Office/Clinic  I discussed the limitations of evaluation and management by telemedicine. The patient expressed understanding and agreed to proceed.  HPI Patient has concerns of coughing, runny nose, congestion. She has been using mucinex and hot tea to help her symptoms. 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 She just found a home covid test and will take it.   Review of Systems  HENT:  Positive for congestion.   Respiratory:  Positive for cough.   Cardiovascular:  Negative for chest pain.  Musculoskeletal:  Negative for myalgias.        Objective:    Ht 5\' 4"  (1.626 m)   Wt 255 lb (115.7 kg)   BMI 43.77 kg/m    Physical Exam Vitals reviewed.  Constitutional:      Appearance: She is well-developed.  HENT:     Head: Normocephalic and atraumatic.  Eyes:     Conjunctiva/sclera: Conjunctivae normal.  Cardiovascular:     Rate and Rhythm: Normal rate.  Pulmonary:     Effort: Pulmonary effort is normal.  Skin:    General: Skin is dry.     Coloration: Skin is not pale.  Neurological:     Mental Status: She is alert and oriented to person, place, and time.  Psychiatric:        Behavior: Behavior normal.     No results found for any visits on 12/08/22.      Assessment & Plan:   Problem List Items Addressed This Visit       Other   Acute cough - Primary    - will go ahead and send in prednisone and albuterol  - tussionex for cough given - encouraged pt to take home covid test and report back because with her pmh she would be eligible for paxlovid - if no better by Friday advised she  would need to come in and be re-evaluated and get a cxr       Nasal congestion    Meds ordered this encounter  Medications   albuterol (VENTOLIN HFA) 108 (90 Base) MCG/ACT inhaler    Sig: Inhale 2 puffs into the lungs every 6 (six) hours as needed for wheezing or shortness of breath.    Dispense:  8 g    Refill:  0   methylPREDNISolone (MEDROL DOSEPAK) 4 MG TBPK tablet    Sig: Follow instructions on pill pack    Dispense:  21 tablet    Refill:  0   chlorpheniramine-HYDROcodone (TUSSIONEX) 10-8 MG/5ML    Sig: Take 5 mLs by mouth every 12 (twelve) hours as needed for cough (cough, will cause drowsiness.).    Dispense:  120 mL    Refill:  0    No follow-ups on file.  Charlton Amor, DO

## 2022-12-12 ENCOUNTER — Ambulatory Visit (INDEPENDENT_AMBULATORY_CARE_PROVIDER_SITE_OTHER): Payer: BC Managed Care – PPO

## 2022-12-12 ENCOUNTER — Other Ambulatory Visit: Payer: Self-pay

## 2022-12-12 ENCOUNTER — Encounter: Payer: Self-pay | Admitting: Emergency Medicine

## 2022-12-12 ENCOUNTER — Ambulatory Visit
Admission: EM | Admit: 2022-12-12 | Discharge: 2022-12-12 | Disposition: A | Discharge status: Home / Self Care | Payer: BC Managed Care – PPO | Attending: Family Medicine | Admitting: Family Medicine

## 2022-12-12 DIAGNOSIS — R918 Other nonspecific abnormal finding of lung field: Secondary | ICD-10-CM | POA: Diagnosis not present

## 2022-12-12 DIAGNOSIS — J189 Pneumonia, unspecified organism: Secondary | ICD-10-CM

## 2022-12-12 DIAGNOSIS — R053 Chronic cough: Secondary | ICD-10-CM | POA: Diagnosis not present

## 2022-12-12 DIAGNOSIS — R059 Cough, unspecified: Secondary | ICD-10-CM | POA: Diagnosis not present

## 2022-12-12 MED ORDER — BENZONATATE 200 MG PO CAPS: 200.0000 mg | ORAL_CAPSULE | Freq: Three times a day (TID) | ORAL | 0 refills | Status: AC | PRN

## 2022-12-12 MED ORDER — AZITHROMYCIN 250 MG PO TABS: 250.0000 mg | ORAL_TABLET | Freq: Every day | ORAL | 0 refills | Status: DC

## 2022-12-12 MED ORDER — AMOXICILLIN-POT CLAVULANATE 875-125 MG PO TABS: 1.0000 | ORAL_TABLET | Freq: Two times a day (BID) | ORAL | 0 refills | Status: AC

## 2022-12-12 NOTE — ED Triage Notes (Signed)
Patient presents to Urgent Care with complaints of wheezing, cough, fatigued, winded since 1 week ago. 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 Hx of asthmatic and bronchitis. Currently taking Prednisone, Albuterol and cough syrup from the video visit on 12/08/22.

## 2022-12-12 NOTE — Discharge Instructions (Addendum)
Advised patient to take medications as directed with food to completion.  Advised may take Tessalon capsules daily or as needed for cough.  Encouraged to increase daily water intake to 64 ounces per day while taking these medications.  Advised if symptoms worsen and/or unresolved please follow-up with PCP or here for further evaluation.  Advised patient to repeat chest x-ray on or about 01/11/2023 to ensure right middle/lower lobe pneumonia has resolved.

## 2022-12-12 NOTE — ED Provider Notes (Signed)
[name]    CSN: [id number] Arrival date & time: 12/12/22  1135      History   Chief Complaint Chief Complaint  Patient presents with   Cough   Wheezing    HPI Morgan Duran is a 57 y.o. female.   HPI 57 year old female presents with wheezing cough and fatigue for 1 week.  Patient was evaluated by her PCP on 9//24 via video visit diagnosed with acute cough and treated with prednisone, albuterol and cough syrup.  PMH significant for morbid obesity, aortic stenosis, and OSA.  Past Medical History:  Diagnosis Date   Anxiety    Arthritis    Benign cyst of left breast 04/09/2019   Diabetes mellitus without complication (HCC)    type 2   Heart murmur    at birth, no problems   Hyperlipidemia    Hypertension    Migraine    Morbid obesity (HCC)    OSA on CPAP    Sleep apnea    uses cpap nightly    Patient Active Problem List   Diagnosis Date Noted   Acute cough 12/08/2022   Nasal congestion 12/08/2022   Lightheadedness 12/01/2021   Encounter for preoperative assessment for noncoronary cardiac surgery 11/05/2021   Essential hypertension 11/05/2021   Benign cyst of left breast 04/09/2019   Statin declined 12/24/2018   History of osteoporosis 12/24/2018   Colon cancer screening 05/29/2018   Diabetic eye exam (HCC) 05/29/2018   Diabetic nephropathy associated with type 2 diabetes mellitus (HCC) 05/26/2018   Diabetic polyneuropathy associated with type 2 diabetes mellitus (HCC) 05/26/2018   Chronic migraine w/o aura w/o status migrainosus, not intractable 09/30/2017   Class 3 severe obesity due to excess calories with serious comorbidity and body mass index (BMI) of 40.0 to 44.9 in adult (HCC) 04/03/2017   Hypertension associated with diabetes (HCC) 04/03/2017   DJD (degenerative joint disease) 03/23/2017   Aortic stenosis, mild 03/23/2017   Primary localized osteoarthritis of right knee 03/22/2017   OSA on CPAP 01/15/2017   Systolic murmur 01/15/2017    Uncontrolled type 2 diabetes mellitus with hyperglycemia (HCC) 05/31/2016    Past Surgical History:  Procedure Laterality Date   COLONOSCOPY  2003   [place] - Normal   KNEE ARTHROSCOPY Right    NASAL SEPTUM SURGERY     SHOULDER ARTHROSCOPY Left    TONSILLECTOMY     UPPER GASTROINTESTINAL ENDOSCOPY  2003   in [place] - Normal    OB History     Gravida  2   Para  2   Term      Preterm      AB      Living         SAB      IAB      Ectopic      Multiple      Live Births               Home Medications    Prior to Admission medications   Medication Sig Start Date End Date Taking? Authorizing Provider  albuterol (VENTOLIN HFA) 108 (90 Base) MCG/ACT inhaler Inhale 2 puffs into the lungs every 6 (six) hours as needed for wheezing or shortness of breath. 12/08/22  Yes Morey Hummingbird S, DO  amoxicillin-clavulanate (AUGMENTIN) 875-125 MG tablet Take 1 tablet by mouth 2 (two) times daily for 10 days. 12/12/22 12/22/22 Yes Trevor Iha, FNP  atorvastatin (LIPITOR) 40 MG tablet TAKE 1 TABLET BY MOUTH EVERY DAY 11/10/22  Yes Morey Hummingbird S, DO  azithromycin (ZITHROMAX) 250 MG tablet Take 1 tablet (250 mg total) by mouth daily. Take first 2 tablets together, then 1 every day until finished. 12/12/22  Yes Trevor Iha, FNP  benzonatate (TESSALON) 200 MG capsule Take 1 capsule (200 mg total) by mouth 3 (three) times daily as needed for up to 7 days. 12/12/22 12/19/22 Yes Trevor Iha, FNP  chlorpheniramine-HYDROcodone (TUSSIONEX) 10-8 MG/5ML Take 5 mLs by mouth every 12 (twelve) hours as needed for cough (cough, will cause drowsiness.). 12/08/22  Yes Wachs, Erika S, DO  Erenumab-aooe (AIMOVIG) 70 MG/ML SOAJ Inject 70 mg into the skin every 30 (thirty) days. 11/30/22  Yes Levert Feinstein, MD  glucose blood (ONE TOUCH ULTRA TEST) test strip USE AS INSTRUCTED 11/05/21  Yes Tamera Punt, Erika S, DO  Melatonin 10 MG TABS Take 30 mg by mouth at bedtime.   Yes [provider]  metFORMIN (GLUCOPHAGE)  1000 MG tablet TAKE 1 TABLET (1,000 MG TOTAL) BY MOUTH TWICE A DAY WITH FOOD 11/29/22  Yes Tamera Punt, Erika S, DO  methylPREDNISolone (MEDROL DOSEPAK) 4 MG TBPK tablet Follow instructions on pill pack 12/08/22  Yes Wachs, Erika S, DO  Multiple Vitamins-Minerals (MULTIVITAMIN WOMEN PO) Take by mouth.   Yes [provider]  Omega-3 Fatty Acids (FISH OIL PO) Take by mouth daily.   Yes [provider]  ondansetron (ZOFRAN-ODT) 4 MG disintegrating tablet Take 1 tablet (4 mg total) by mouth every 8 (eight) hours as needed for nausea or vomiting. PLACE 1 TABLET ON THE TONGUE EVERY 8 HOURS AS NEEDED FOR NAUSEA OR VOMITING 11/19/22  Yes Levert Feinstein, MD  OVER THE COUNTER MEDICATION Take 60 mLs by mouth at bedtime.   Yes [provider]  Semaglutide, 2 MG/DOSE, 8 MG/3ML SOPN Inject 2 mg as directed once a week. 07/14/22  Yes Tamera Punt, Erika S, DO  SUMAtriptan (IMITREX) 6 MG/0.5ML SOLN injection Inject 0.5 mLs (6 mg total) into the skin every 2 (two) hours as needed for migraine or headache. May repeat in 2 hours if headache persists or recurs. 11/25/22  Yes Levert Feinstein, MD  tiZANidine (ZANAFLEX) 4 MG tablet Take 1 tablet (4 mg total) by mouth every 6 (six) hours as needed for muscle spasms. 11/19/22  Yes Levert Feinstein, MD  Insulin Syringe-Needle U-100 (INSULIN SYRINGE .3CC/31GX5/16") 31G X 5/16" 0.3 ML MISC 1 Syringe by Does not apply route daily as needed. 12/04/21   Charlton Amor, DO    Family History Family History  Problem Relation Age of Onset   Emphysema Mother    Diabetes Father    Colon polyps Neg Hx    Rectal cancer Neg Hx    Stomach cancer Neg Hx     Social History Social History   Tobacco Use   Smoking status: Never   Smokeless tobacco: Never  Vaping Use   Vaping status: Never Used  Substance Use Topics   Alcohol use: Not Currently   Drug use: Never     Allergies   Guaifenesin-codeine   Review of Systems Review of Systems   Physical Exam Triage Vital Signs ED  Triage Vitals  Encounter Vitals Group     BP 12/12/22 1219 (!) 137/96     Systolic BP Percentile --      Diastolic BP Percentile --      Pulse Rate 12/12/22 1219 98     Resp 12/12/22 1219 18     Temp 12/12/22 1219 98.7 F (37.1 C)  Temp Source 12/12/22 1219 Oral     SpO2 12/12/22 1219 95 %     Weight --      Height --      Head Circumference --      Peak Flow --      Pain Score 12/12/22 1215 0     Pain Loc --      Pain Education --      Exclude from Growth Chart --    No data found.  Updated Vital Signs BP (!) 137/96 (BP Location: Right Arm)   Pulse 98   Temp 98.7 F (37.1 C) (Oral)   Resp 18   SpO2 95%   Physical Exam Vitals and nursing note reviewed.  Constitutional:      Appearance: Normal appearance. She is normal weight.  HENT:     Head: Normocephalic and atraumatic.     Right Ear: Tympanic membrane, ear canal and external ear normal.     Left Ear: Tympanic membrane, ear canal and external ear normal.     Mouth/Throat:     Mouth: Mucous membranes are moist.     Pharynx: Oropharynx is clear.  Eyes:     Extraocular Movements: Extraocular movements intact.     Conjunctiva/sclera: Conjunctivae normal.     Pupils: Pupils are equal, round, and reactive to light.  Cardiovascular:     Rate and Rhythm: Normal rate and regular rhythm.     Pulses: Normal pulses.     Heart sounds: Normal heart sounds.  Pulmonary:     Effort: Pulmonary effort is normal.     Breath sounds: No wheezing, rhonchi or rales.     Comments: Fine rales and crackles noted over right middle and lower field, frequent nonproductive cough on exam Musculoskeletal:        General: Normal range of motion.     Cervical back: Normal range of motion and neck supple.  Skin:    General: Skin is warm and dry.  Neurological:     General: No focal deficit present.     Mental Status: She is alert and oriented to person, place, and time. Mental status is at baseline.  Psychiatric:        Mood and  Affect: Mood normal.        Behavior: Behavior normal.      UC Treatments / Results  Labs (all labs ordered are listed, but only abnormal results are displayed) Labs Reviewed - No data to display  EKG   Radiology DG Chest 2 View  Result Date: 12/12/2022 CLINICAL DATA:  With recurrent cough. EXAM: CHEST - 2 VIEW COMPARISON:  None Available. FINDINGS: Cardiomediastinal silhouette is normal. Mediastinal contours appear intact. Subtle peribronchial airspace consolidation in the right lower and right middle lobes. Osseous structures are without acute abnormality. Soft tissues are grossly normal. IMPRESSION: Subtle peribronchial airspace consolidation in the right lower and right middle lobes, may represent bronchitis or atypical pneumonia. Electronically Signed   By: Ted Mcalpine M.D.   On: 12/12/2022 12:32    Procedures Procedures (including critical care time)  Medications Ordered in UC Medications - No data to display  Initial Impression / Assessment and Plan / UC Course  I have reviewed the triage vital signs and the nursing notes.  Pertinent labs & imaging results that were available during my care of the patient were reviewed by me and considered in my medical decision making (see chart for details).     MDM: 1.  Community-acquired pneumonia of  right middle lobe of lung-CXR results revealed above, Rx'd Augmentin 875/125 mg tablet: Take 1 tablet twice daily x 10 days, Rx'd Zithromax (500 mg day 1, then 250 mg daily x 4 days); 2.  Cough unspecified type-Rx'd Tessalon 200 mg capsules 3 times daily, as needed. Advised patient to take medications as directed with food to completion.  Advised may take Tessalon capsules daily or as needed for cough.  Encouraged to increase daily water intake to 64 ounces per day while taking these medications.  Advised if symptoms worsen and/or unresolved please follow-up with PCP or here for further evaluation.  Advised patient to repeat chest x-ray  on or about 01/11/2023 to ensure right middle/lower lobe pneumonia has resolved.  Patient discharged home, hemodynamically stable.  Final Clinical Impressions(s) / UC Diagnoses   Final diagnoses:  Community acquired pneumonia of right middle lobe of lung  Cough, unspecified type     Discharge Instructions      Advised patient to take medications as directed with food to completion.  Advised may take Tessalon capsules daily or as needed for cough.  Encouraged to increase daily water intake to 64 ounces per day while taking these medications.  Advised if symptoms worsen and/or unresolved please follow-up with PCP or here for further evaluation.  Advised patient to repeat chest x-ray on or about 01/11/2023 to ensure right middle/lower lobe pneumonia has resolved.     ED Prescriptions     Medication Sig Dispense Auth. Provider   azithromycin (ZITHROMAX) 250 MG tablet Take 1 tablet (250 mg total) by mouth daily. Take first 2 tablets together, then 1 every day until finished. 6 tablet Trevor Iha, FNP   amoxicillin-clavulanate (AUGMENTIN) 875-125 MG tablet Take 1 tablet by mouth 2 (two) times daily for 10 days. 20 tablet Trevor Iha, FNP   benzonatate (TESSALON) 200 MG capsule Take 1 capsule (200 mg total) by mouth 3 (three) times daily as needed for up to 7 days. 40 capsule Trevor Iha, FNP      PDMP not reviewed this encounter.   Trevor Iha, FNP 12/12/22 1309

## 2023-01-03 ENCOUNTER — Encounter: Payer: Self-pay | Admitting: Family Medicine

## 2023-01-03 NOTE — Telephone Encounter (Signed)
Resolved. Patient scheduled for Jan 14, 2023

## 2023-01-07 ENCOUNTER — Ambulatory Visit: Payer: BC Managed Care – PPO | Admitting: Family Medicine

## 2023-01-14 ENCOUNTER — Encounter: Payer: Self-pay | Admitting: Family Medicine

## 2023-01-14 ENCOUNTER — Ambulatory Visit: Payer: BC Managed Care – PPO | Admitting: Family Medicine

## 2023-01-14 VITALS — BP 146/85 | HR 92 | Ht 64.0 in | Wt 252.0 lb

## 2023-01-14 DIAGNOSIS — Z7984 Long term (current) use of oral hypoglycemic drugs: Secondary | ICD-10-CM

## 2023-01-14 DIAGNOSIS — F5101 Primary insomnia: Secondary | ICD-10-CM

## 2023-01-14 DIAGNOSIS — Z23 Encounter for immunization: Secondary | ICD-10-CM

## 2023-01-14 DIAGNOSIS — E1165 Type 2 diabetes mellitus with hyperglycemia: Secondary | ICD-10-CM

## 2023-01-14 LAB — POCT GLYCOSYLATED HEMOGLOBIN (HGB A1C): Hemoglobin A1C: 8.8 % — AB (ref 4.0–5.6)

## 2023-01-14 MED ORDER — TRAZODONE HCL 50 MG PO TABS
25.0000 mg | ORAL_TABLET | Freq: Every evening | ORAL | 0 refills | Status: DC | PRN
Start: 2023-01-14 — End: 2023-02-14

## 2023-01-14 NOTE — Assessment & Plan Note (Signed)
A1c much improved to 8.8% and pt doing well on ozempic we will keep her here since she has more 1mg  vials

## 2023-01-14 NOTE — Assessment & Plan Note (Signed)
-   pt notes trouble sleeping, will go ahead and trial trazodone - follow up one month

## 2023-01-14 NOTE — Progress Notes (Signed)
Established patient visit   Patient: Morgan Duran   DOB: 11/12/1965   57 y.o. Female  MRN: [id number] Visit Date: 01/14/2023  Today's healthcare provider: Charlton Amor, DO   Chief Complaint  Patient presents with   Medical Management of Chronic Issues    DM last A1C 10/08/22 10.7    SUBJECTIVE    Chief Complaint  Patient presents with   Medical Management of Chronic Issues    DM last A1C 10/08/22 10.7   HPI HPI     Medical Management of Chronic Issues    Additional comments: DM last A1C 10/08/22 10.7      Last edited by Roselyn Reef, CMA on 01/14/2023 10:22 AM.       Pt presents to follow up on T2DM. Last A1c elevated to 10.7. Currently on ozempic 1mg  with plans to increase pending tolerability.   Has been having trouble sleeping  Review of Systems  Constitutional:  Negative for activity change, fatigue and fever.  Respiratory:  Negative for cough and shortness of breath.   Cardiovascular:  Negative for chest pain.  Gastrointestinal:  Negative for abdominal pain.  Genitourinary:  Negative for difficulty urinating.       Current Meds  Medication Sig   albuterol (VENTOLIN HFA) 108 (90 Base) MCG/ACT inhaler Inhale 2 puffs into the lungs every 6 (six) hours as needed for wheezing or shortness of breath.   atorvastatin (LIPITOR) 40 MG tablet TAKE 1 TABLET BY MOUTH EVERY DAY   azithromycin (ZITHROMAX) 250 MG tablet Take 1 tablet (250 mg total) by mouth daily. Take first 2 tablets together, then 1 every day until finished.   chlorpheniramine-HYDROcodone (TUSSIONEX) 10-8 MG/5ML Take 5 mLs by mouth every 12 (twelve) hours as needed for cough (cough, will cause drowsiness.).   Erenumab-aooe (AIMOVIG) 70 MG/ML SOAJ Inject 70 mg into the skin every 30 (thirty) days.   glucose blood (ONE TOUCH ULTRA TEST) test strip USE AS INSTRUCTED   Melatonin 10 MG TABS Take 30 mg by mouth at bedtime.   metFORMIN (GLUCOPHAGE) 1000 MG tablet TAKE 1 TABLET (1,000 MG TOTAL) BY MOUTH TWICE  A DAY WITH FOOD   methylPREDNISolone (MEDROL DOSEPAK) 4 MG TBPK tablet Follow instructions on pill pack   Multiple Vitamins-Minerals (MULTIVITAMIN WOMEN PO) Take by mouth.   Omega-3 Fatty Acids (FISH OIL PO) Take by mouth daily.   ondansetron (ZOFRAN-ODT) 4 MG disintegrating tablet Take 1 tablet (4 mg total) by mouth every 8 (eight) hours as needed for nausea or vomiting. PLACE 1 TABLET ON THE TONGUE EVERY 8 HOURS AS NEEDED FOR NAUSEA OR VOMITING   OVER THE COUNTER MEDICATION Take 60 mLs by mouth at bedtime.   Semaglutide, 2 MG/DOSE, 8 MG/3ML SOPN Inject 2 mg as directed once a week.   SUMAtriptan (IMITREX) 6 MG/0.5ML SOLN injection Inject 0.5 mLs (6 mg total) into the skin every 2 (two) hours as needed for migraine or headache. May repeat in 2 hours if headache persists or recurs.   tiZANidine (ZANAFLEX) 4 MG tablet Take 1 tablet (4 mg total) by mouth every 6 (six) hours as needed for muscle spasms.   traZODone (DESYREL) 50 MG tablet Take 0.5-1 tablets (25-50 mg total) by mouth at bedtime as needed.    OBJECTIVE    BP (!) 146/85 (BP Location: Left Arm, Patient Position: Sitting, Cuff Size: Large)   Pulse 92   Ht 5\' 4"  (1.626 m)   Wt 252 lb (114.3 kg)   SpO2 100%  BMI 43.26 kg/m   Physical Exam Vitals and nursing note reviewed.  Constitutional:      General: She is not in acute distress.    Appearance: Normal appearance.  HENT:     Head: Normocephalic and atraumatic.     Right Ear: External ear normal.     Left Ear: External ear normal.     Nose: Nose normal.  Eyes:     Conjunctiva/sclera: Conjunctivae normal.  Cardiovascular:     Rate and Rhythm: Normal rate and regular rhythm.  Pulmonary:     Effort: Pulmonary effort is normal.     Breath sounds: Normal breath sounds.  Neurological:     General: No focal deficit present.     Mental Status: She is alert and oriented to person, place, and time.  Psychiatric:        Mood and Affect: Mood normal.        Behavior: Behavior  normal.        Thought Content: Thought content normal.        Judgment: Judgment normal.        ASSESSMENT & PLAN    Problem List Items Addressed This Visit       Endocrine   Uncontrolled type 2 diabetes mellitus with hyperglycemia (HCC) - Primary    A1c much improved to 8.8% and pt doing well on ozempic we will keep her here since she has more 1mg  vials      Relevant Orders   POCT HgB A1C (Completed)   Basic Metabolic Panel (BMET)   Microalbumin / creatinine urine ratio     Other   Primary insomnia    - pt notes trouble sleeping, will go ahead and trial trazodone - follow up one month      Relevant Medications   traZODone (DESYREL) 50 MG tablet   Other Visit Diagnoses     Encounter for immunization       Relevant Orders   Flu vaccine trivalent PF, 6mos and older(Flulaval,Afluria,Fluarix,Fluzone) (Completed)       Return in about 4 weeks (around 02/11/2023) for insomnia.      Meds ordered this encounter  Medications   traZODone (DESYREL) 50 MG tablet    Sig: Take 0.5-1 tablets (25-50 mg total) by mouth at bedtime as needed.    Dispense:  30 tablet    Refill:  0    Orders Placed This Encounter  Procedures   Flu vaccine trivalent PF, 6mos and older(Flulaval,Afluria,Fluarix,Fluzone)   Basic Metabolic Panel (BMET)   Microalbumin / creatinine urine ratio   POCT HgB A1C     Charlton Amor, DO  [facility] at [facility] [phone] (phone) [phone] (fax)  [facility]

## 2023-01-15 ENCOUNTER — Ambulatory Visit
Admission: EM | Admit: 2023-01-15 | Discharge: 2023-01-15 | Disposition: A | Discharge status: Home / Self Care | Payer: BC Managed Care – PPO | Attending: Family Medicine | Admitting: Family Medicine

## 2023-01-15 ENCOUNTER — Other Ambulatory Visit: Payer: Self-pay

## 2023-01-15 DIAGNOSIS — R1110 Vomiting, unspecified: Secondary | ICD-10-CM

## 2023-01-15 DIAGNOSIS — G43819 Other migraine, intractable, without status migrainosus: Secondary | ICD-10-CM | POA: Diagnosis not present

## 2023-01-15 DIAGNOSIS — E1165 Type 2 diabetes mellitus with hyperglycemia: Secondary | ICD-10-CM | POA: Diagnosis not present

## 2023-01-15 DIAGNOSIS — Z7984 Long term (current) use of oral hypoglycemic drugs: Secondary | ICD-10-CM

## 2023-01-15 LAB — BASIC METABOLIC PANEL
BUN/Creatinine Ratio: 14 (ref 9–23)
BUN: 12 mg/dL (ref 6–24)
CO2: 23 mmol/L (ref 20–29)
Calcium: 9.5 mg/dL (ref 8.7–10.2)
Chloride: 100 mmol/L (ref 96–106)
Creatinine, Ser: 0.84 mg/dL (ref 0.57–1.00)
Glucose: 160 mg/dL — ABNORMAL HIGH (ref 70–99)
Potassium: 4.2 mmol/L (ref 3.5–5.2)
Sodium: 139 mmol/L (ref 134–144)
eGFR: 82 mL/min/{1.73_m2} (ref >59)

## 2023-01-15 LAB — MICROALBUMIN / CREATININE URINE RATIO
Creatinine, Urine: 146 mg/dL
Microalb/Creat Ratio: 26 mg/g{creat} (ref 0–29)
Microalbumin, Urine: 37.4 ug/mL

## 2023-01-15 LAB — POCT FASTING CBG KUC MANUAL ENTRY: POCT Glucose (KUC): 152 mg/dL — AB (ref 70–99)

## 2023-01-15 MED ORDER — ONDANSETRON HCL 4 MG/2ML IJ SOLN
8.0000 mg | INTRAMUSCULAR | Status: AC
  Administered 2023-01-15: 8 mg via INTRAMUSCULAR

## 2023-01-15 NOTE — Discharge Instructions (Signed)
You may repeat Zofran again if needed Stay on clear liquids and bland diet until the vomiting passes Go to the ER if for any reason you get worse

## 2023-01-15 NOTE — ED Triage Notes (Addendum)
C/o vomiting since 0830, chills, muscle aches, migraine. Has been taking zofran but vomited it up. Thinks she is having a reaction to the flu vaccine.

## 2023-01-16 NOTE — ED Provider Notes (Signed)
[name]    CSN: [id number] Arrival date & time: 01/15/23  1548      History   Chief Complaint Chief Complaint  Patient presents with   Emesis    HPI Morgan Duran is a 57 y.o. female.   HPI  Patient states that she was having a migraine.  She took medicine for the migraine but cannot stop the vomiting.  She also had some chills and thought she might have a virus.  In any event she is throwing up Zofran and cannot keep down even water. She also got a flu shot yesterday.  She states that she has never had a flu shot before.  She worries this is a flu shot reaction.  I told her that I think it is not  Past Medical History:  Diagnosis Date   Anxiety    Arthritis    Benign cyst of left breast 04/09/2019   Diabetes mellitus without complication (HCC)    type 2   Heart murmur    at birth, no problems   Hyperlipidemia    Hypertension    Migraine    Morbid obesity (HCC)    OSA on CPAP    Sleep apnea    uses cpap nightly    Patient Active Problem List   Diagnosis Date Noted   Primary insomnia 01/14/2023   Acute cough 12/08/2022   Nasal congestion 12/08/2022   Lightheadedness 12/01/2021   Encounter for preoperative assessment for noncoronary cardiac surgery 11/05/2021   Essential hypertension 11/05/2021   Benign cyst of left breast 04/09/2019   Statin declined 12/24/2018   History of osteoporosis 12/24/2018   Colon cancer screening 05/29/2018   Diabetic eye exam (HCC) 05/29/2018   Diabetic nephropathy associated with type 2 diabetes mellitus (HCC) 05/26/2018   Diabetic polyneuropathy associated with type 2 diabetes mellitus (HCC) 05/26/2018   Chronic migraine w/o aura w/o status migrainosus, not intractable 09/30/2017   Class 3 severe obesity due to excess calories with serious comorbidity and body mass index (BMI) of 40.0 to 44.9 in adult (HCC) 04/03/2017   Hypertension associated with diabetes (HCC) 04/03/2017   DJD (degenerative joint disease)  03/23/2017   Aortic stenosis, mild 03/23/2017   Primary localized osteoarthritis of right knee 03/22/2017   OSA on CPAP 01/15/2017   Systolic murmur 01/15/2017   Uncontrolled type 2 diabetes mellitus with hyperglycemia (HCC) 05/31/2016    Past Surgical History:  Procedure Laterality Date   COLONOSCOPY  2003   [place] - Normal   COLONOSCOPY  11/2019   NORMAL, Repeat q 5 yrs   KNEE ARTHROSCOPY Right    NASAL SEPTUM SURGERY     SHOULDER ARTHROSCOPY Left    TONSILLECTOMY     UPPER GASTROINTESTINAL ENDOSCOPY  2003   in [place] - Normal    OB History     Gravida  2   Para  2   Term      Preterm      AB      Living         SAB      IAB      Ectopic      Multiple      Live Births               Home Medications    Prior to Admission medications   Medication Sig Start Date End Date Taking? Authorizing Provider  albuterol (VENTOLIN HFA) 108 (90 Base) MCG/ACT inhaler Inhale 2 puffs into the lungs every  6 (six) hours as needed for wheezing or shortness of breath. 12/08/22   Charlton Amor, DO  atorvastatin (LIPITOR) 40 MG tablet TAKE 1 TABLET BY MOUTH EVERY DAY 11/10/22   Morey Hummingbird S, DO  azithromycin (ZITHROMAX) 250 MG tablet Take 1 tablet (250 mg total) by mouth daily. Take first 2 tablets together, then 1 every day until finished. 12/12/22   Trevor Iha, FNP  chlorpheniramine-HYDROcodone (TUSSIONEX) 10-8 MG/5ML Take 5 mLs by mouth every 12 (twelve) hours as needed for cough (cough, will cause drowsiness.). 12/08/22   Charlton Amor, DO  Erenumab-aooe (AIMOVIG) 70 MG/ML SOAJ Inject 70 mg into the skin every 30 (thirty) days. 11/30/22   Levert Feinstein, MD  glucose blood (ONE TOUCH ULTRA TEST) test strip USE AS INSTRUCTED 11/05/21   Charlton Amor, DO  Melatonin 10 MG TABS Take 30 mg by mouth at bedtime.    [provider]  metFORMIN (GLUCOPHAGE) 1000 MG tablet TAKE 1 TABLET (1,000 MG TOTAL) BY MOUTH TWICE A DAY WITH FOOD 11/29/22   Charlton Amor, DO   methylPREDNISolone (MEDROL DOSEPAK) 4 MG TBPK tablet Follow instructions on pill pack 12/08/22   Charlton Amor, DO  Multiple Vitamins-Minerals (MULTIVITAMIN WOMEN PO) Take by mouth.    [provider]  Omega-3 Fatty Acids (FISH OIL PO) Take by mouth daily.    [provider]  ondansetron (ZOFRAN-ODT) 4 MG disintegrating tablet Take 1 tablet (4 mg total) by mouth every 8 (eight) hours as needed for nausea or vomiting. PLACE 1 TABLET ON THE TONGUE EVERY 8 HOURS AS NEEDED FOR NAUSEA OR VOMITING 11/19/22   Levert Feinstein, MD  OVER THE COUNTER MEDICATION Take 60 mLs by mouth at bedtime.    [provider]  Semaglutide, 2 MG/DOSE, 8 MG/3ML SOPN Inject 2 mg as directed once a week. 07/14/22   Charlton Amor, DO  SUMAtriptan (IMITREX) 6 MG/0.5ML SOLN injection Inject 0.5 mLs (6 mg total) into the skin every 2 (two) hours as needed for migraine or headache. May repeat in 2 hours if headache persists or recurs. 11/25/22   Levert Feinstein, MD  tiZANidine (ZANAFLEX) 4 MG tablet Take 1 tablet (4 mg total) by mouth every 6 (six) hours as needed for muscle spasms. 11/19/22   Levert Feinstein, MD  traZODone (DESYREL) 50 MG tablet Take 0.5-1 tablets (25-50 mg total) by mouth at bedtime as needed. 01/14/23   Charlton Amor, DO    Family History Family History  Problem Relation Age of Onset   Emphysema Mother    Diabetes Father    Colon polyps Neg Hx    Rectal cancer Neg Hx    Stomach cancer Neg Hx     Social History Social History   Tobacco Use   Smoking status: Never   Smokeless tobacco: Never  Vaping Use   Vaping status: Never Used  Substance Use Topics   Alcohol use: Not Currently   Drug use: Never     Allergies   Guaifenesin-codeine   Review of Systems Review of Systems See HPI  Physical Exam Triage Vital Signs ED Triage Vitals  Encounter Vitals Group     BP 01/15/23 1552 (!) 163/100     Systolic BP Percentile --      Diastolic BP Percentile --      Pulse Rate 01/15/23  1552 86     Resp 01/15/23 1552 16     Temp 01/15/23 1552 (!) 97.5 F (36.4 C)  Temp src --      SpO2 01/15/23 1552 99 %     Weight --      Height --      Head Circumference --      Peak Flow --      Pain Score 01/15/23 1553 5     Pain Loc --      Pain Education --      Exclude from Growth Chart --    No data found.  Updated Vital Signs BP (!) 163/100 (BP Location: Left Arm)   Pulse 86   Temp (!) 97.5 F (36.4 C)   Resp 16   SpO2 99%     Physical Exam Constitutional:      General: She is not in acute distress.    Appearance: She is well-developed. She is obese. She is ill-appearing.  HENT:     Head: Normocephalic and atraumatic.  Eyes:     Conjunctiva/sclera: Conjunctivae normal.     Pupils: Pupils are equal, round, and reactive to light.  Cardiovascular:     Rate and Rhythm: Normal rate and regular rhythm.     Heart sounds: Normal heart sounds.  Pulmonary:     Effort: Pulmonary effort is normal. No respiratory distress.     Breath sounds: Normal breath sounds.  Abdominal:     General: There is no distension.     Palpations: Abdomen is soft.     Tenderness: There is no abdominal tenderness.  Musculoskeletal:        General: Normal range of motion.     Cervical back: Normal range of motion.  Skin:    General: Skin is warm and dry.  Neurological:     Mental Status: She is alert.      UC Treatments / Results  Labs (all labs ordered are listed, but only abnormal results are displayed) Labs Reviewed  POCT FASTING CBG [facility] MANUAL ENTRY - Abnormal; Notable for the following components:      Result Value   POCT Glucose ([facility]) 152 (*)    All other components within normal limits    EKG   Radiology No results found.  Procedures Procedures (including critical care time)  Medications Ordered in UC Medications  ondansetron (ZOFRAN) injection 8 mg (8 mg Intramuscular Given 01/15/23 1608)    Initial Impression / Assessment and Plan / UC Course  I have  reviewed the triage vital signs and the nursing notes.  Pertinent labs & imaging results that were available during my care of the patient were reviewed by me and considered in my medical decision making (see chart for details).     Vital signs do not support dehydration.  I do not think she needs the IV fluids.  She was given an IM injection of Zofran.  She was rechecked in 20 minutes and states she was feeling improved.  She was advised to go to the emergency room for any worsening of condition Final Clinical Impressions(s) / UC Diagnoses   Final diagnoses:  Uncontrollable vomiting  Uncontrolled type 2 diabetes mellitus with hyperglycemia (HCC)  Other migraine without status migrainosus, intractable     Discharge Instructions      You may repeat Zofran again if needed Stay on clear liquids and bland diet until the vomiting passes Go to the ER if for any reason you get worse    ED Prescriptions   None    PDMP not reviewed this encounter.   Eustace Moore, MD 01/16/23 1249

## 2023-02-08 ENCOUNTER — Other Ambulatory Visit: Payer: Self-pay | Admitting: Family Medicine

## 2023-02-08 DIAGNOSIS — F5101 Primary insomnia: Secondary | ICD-10-CM

## 2023-02-11 ENCOUNTER — Ambulatory Visit (INDEPENDENT_AMBULATORY_CARE_PROVIDER_SITE_OTHER): Payer: BC Managed Care – PPO | Admitting: Family Medicine

## 2023-02-11 ENCOUNTER — Encounter: Payer: Self-pay | Admitting: Family Medicine

## 2023-02-11 VITALS — BP 138/88 | HR 87 | Ht 64.0 in | Wt 252.8 lb

## 2023-02-11 DIAGNOSIS — Z23 Encounter for immunization: Secondary | ICD-10-CM

## 2023-02-11 DIAGNOSIS — F5101 Primary insomnia: Secondary | ICD-10-CM

## 2023-02-11 NOTE — Progress Notes (Signed)
Established patient visit   Patient: Morgan Duran   DOB: 1965/09/28   57 y.o. Female  MRN: [id number] Visit Date: 02/11/2023  Today's healthcare provider: Charlton Amor, DO   Chief Complaint  Patient presents with   Medical Management of Chronic Issues    Insomnia     SUBJECTIVE    Chief Complaint  Patient presents with   Medical Management of Chronic Issues    Insomnia    HPI HPI     Medical Management of Chronic Issues    Additional comments: Insomnia       Last edited by Roselyn Reef, CMA on 02/11/2023 10:07 AM.       Pt presents for insomnia follow up. She is doing well on trazodone 50mg  nightly.    Review of Systems  Constitutional:  Negative for activity change, fatigue and fever.  Respiratory:  Negative for cough and shortness of breath.   Cardiovascular:  Negative for chest pain.  Gastrointestinal:  Negative for abdominal pain.  Genitourinary:  Negative for difficulty urinating.       Current Meds  Medication Sig   albuterol (VENTOLIN HFA) 108 (90 Base) MCG/ACT inhaler Inhale 2 puffs into the lungs every 6 (six) hours as needed for wheezing or shortness of breath.   atorvastatin (LIPITOR) 40 MG tablet TAKE 1 TABLET BY MOUTH EVERY DAY   azithromycin (ZITHROMAX) 250 MG tablet Take 1 tablet (250 mg total) by mouth daily. Take first 2 tablets together, then 1 every day until finished.   chlorpheniramine-HYDROcodone (TUSSIONEX) 10-8 MG/5ML Take 5 mLs by mouth every 12 (twelve) hours as needed for cough (cough, will cause drowsiness.).   Erenumab-aooe (AIMOVIG) 70 MG/ML SOAJ Inject 70 mg into the skin every 30 (thirty) days.   glucose blood (ONE TOUCH ULTRA TEST) test strip USE AS INSTRUCTED   Melatonin 10 MG TABS Take 30 mg by mouth at bedtime.   metFORMIN (GLUCOPHAGE) 1000 MG tablet TAKE 1 TABLET (1,000 MG TOTAL) BY MOUTH TWICE A DAY WITH FOOD   methylPREDNISolone (MEDROL DOSEPAK) 4 MG TBPK tablet Follow instructions on pill pack   Multiple  Vitamins-Minerals (MULTIVITAMIN WOMEN PO) Take by mouth.   Omega-3 Fatty Acids (FISH OIL PO) Take by mouth daily.   ondansetron (ZOFRAN-ODT) 4 MG disintegrating tablet Take 1 tablet (4 mg total) by mouth every 8 (eight) hours as needed for nausea or vomiting. PLACE 1 TABLET ON THE TONGUE EVERY 8 HOURS AS NEEDED FOR NAUSEA OR VOMITING   OVER THE COUNTER MEDICATION Take 60 mLs by mouth at bedtime.   Semaglutide, 2 MG/DOSE, 8 MG/3ML SOPN Inject 2 mg as directed once a week.   SUMAtriptan (IMITREX) 6 MG/0.5ML SOLN injection Inject 0.5 mLs (6 mg total) into the skin every 2 (two) hours as needed for migraine or headache. May repeat in 2 hours if headache persists or recurs.   tiZANidine (ZANAFLEX) 4 MG tablet Take 1 tablet (4 mg total) by mouth every 6 (six) hours as needed for muscle spasms.   traZODone (DESYREL) 50 MG tablet Take 0.5-1 tablets (25-50 mg total) by mouth at bedtime as needed.    OBJECTIVE    BP 138/88 (BP Location: Left Arm, Patient Position: Sitting, Cuff Size: Large)   Pulse 87   Ht 5\' 4"  (1.626 m)   Wt 252 lb 12 oz (114.6 kg)   SpO2 98%   BMI 43.38 kg/m   Physical Exam Vitals reviewed.  Constitutional:      Appearance: She is well-developed.  HENT:     Head: Normocephalic and atraumatic.  Eyes:     Conjunctiva/sclera: Conjunctivae normal.  Cardiovascular:     Rate and Rhythm: Normal rate.  Pulmonary:     Effort: Pulmonary effort is normal.  Skin:    General: Skin is dry.     Coloration: Skin is not pale.  Neurological:     Mental Status: She is alert and oriented to person, place, and time.  Psychiatric:        Behavior: Behavior normal.        ASSESSMENT & PLAN    Problem List Items Addressed This Visit       Other   Primary insomnia - Primary    - continue trazodone pt is doing well       No follow-ups on file.      No orders of the defined types were placed in this encounter.   No orders of the defined types were placed in this  encounter.    Charlton Amor, DO  [facility] at [facility] [phone] (phone) [phone] (fax)  [facility]

## 2023-02-11 NOTE — Assessment & Plan Note (Addendum)
-   continue trazodone pt is doing well

## 2023-02-12 ENCOUNTER — Other Ambulatory Visit: Payer: Self-pay | Admitting: Family Medicine

## 2023-02-25 DIAGNOSIS — H903 Sensorineural hearing loss, bilateral: Secondary | ICD-10-CM | POA: Diagnosis not present

## 2023-03-16 DIAGNOSIS — H903 Sensorineural hearing loss, bilateral: Secondary | ICD-10-CM | POA: Diagnosis not present

## 2023-04-18 ENCOUNTER — Other Ambulatory Visit: Payer: Self-pay | Admitting: Family Medicine

## 2023-04-18 DIAGNOSIS — R110 Nausea: Secondary | ICD-10-CM

## 2023-04-19 ENCOUNTER — Other Ambulatory Visit: Payer: Self-pay | Admitting: Family Medicine

## 2023-04-19 DIAGNOSIS — R110 Nausea: Secondary | ICD-10-CM

## 2023-04-19 MED ORDER — ONDANSETRON 4 MG PO TBDP
4.0000 mg | ORAL_TABLET | Freq: Three times a day (TID) | ORAL | 0 refills | Status: DC | PRN
Start: 2023-04-19 — End: 2023-06-01

## 2023-04-22 ENCOUNTER — Ambulatory Visit: Payer: BC Managed Care – PPO | Admitting: Family Medicine

## 2023-05-06 DIAGNOSIS — M25512 Pain in left shoulder: Secondary | ICD-10-CM | POA: Diagnosis not present

## 2023-05-11 DIAGNOSIS — Z471 Aftercare following joint replacement surgery: Secondary | ICD-10-CM | POA: Diagnosis not present

## 2023-05-11 DIAGNOSIS — Z96651 Presence of right artificial knee joint: Secondary | ICD-10-CM | POA: Diagnosis not present

## 2023-05-20 DIAGNOSIS — M25512 Pain in left shoulder: Secondary | ICD-10-CM | POA: Diagnosis not present

## 2023-05-21 ENCOUNTER — Other Ambulatory Visit: Payer: Self-pay | Admitting: Family Medicine

## 2023-05-31 NOTE — Progress Notes (Unsigned)
 No chief complaint on file.     ASSESSMENT AND PLAN  Morgan Duran is a 58 y.o. female   Chronic migraine Obesity Obstructive sleep apnea  Start preventive medication aimovig 70 mg monthly  Suboptimal response to Maxalt dissolvable as needed, add on Imitrex subcutaneous injection for rescue treatment, may combine with tizanidine, Aleve, Zofran for prolonged severe headache  Return To Clinic With NP In 6 Months   DIAGNOSTIC DATA (LABS, IMAGING, TESTING) - I reviewed patient records, labs, notes, testing and imaging myself where available.   MEDICAL HISTORY:  Update 06/01/2023 JM: Patient returns for 51-month follow-up visit.      Consult visit 11/19/2022 Dr. Terrace Arabia: Morgan Duran, is a 58 year old female seen in request by her primary care doctor Morey Hummingbird for evaluation of chronic migraine, initial evaluation November 19, 2022   I reviewed and summarized the referring note. PMHX Chronic migraine DM Obesity HLD OSA    She started to have migraines since 2003, typical migraine are retro-orbital area severe pounding headache with light, noise, smell sensitivity, nauseous, lasting for hours, often extending to her neck region, occasionally during intense headache she also described difficulty thinking, word finding difficulties, slurred speech  Her migraine usually cost her around her menstruation, she went through menopause, still have frequent headaches, 1-2 times each week at least, other trigger for her migraine are weather change, certain heavy starchy food, cheese, stress, sleep deprivation, strong smells,  Over the years, she has tried different preventive medications with suboptimal response such as propranolol, lamotrigine, amitriptyline, Topamax,  She tried Imitrex tablets in the past, did not help her headache much, over the years, she was given Maxalt dissolvable, helped her a 50% of the time, often limited because of her severe nausea, vomiting, sometimes she woke up  with severe headache, also had to go to urgent care for cocktail,   PHYSICAL EXAM:   There were no vitals filed for this visit.   There is no height or weight on file to calculate BMI.  PHYSICAL EXAMNIATION:  Gen: NAD, conversant, well nourised, well groomed                     Cardiovascular: Regular rate rhythm, no peripheral edema, warm, nontender. Eyes: Conjunctivae clear without exudates or hemorrhage Neck: Supple, no carotid bruits. Pulmonary: Clear to auscultation bilaterally   NEUROLOGICAL EXAM:  MENTAL STATUS: Speech/cognition: Obese, awake, alert, oriented to history taking and casual conversation CRANIAL NERVES: CN II: Visual fields are full to confrontation. Pupils are round equal and briskly reactive to light. CN III, IV, VI: extraocular movement are normal. No ptosis. CN V: Facial sensation is intact to light touch CN VII: Face is symmetric with normal eye closure  CN VIII: Hearing is normal to causal conversation. CN IX, X: Phonation is normal. CN XI: Head turning and shoulder shrug are intact CN XII: Narrow oropharyngeal space  MOTOR: There is no pronator drift of out-stretched arms. Muscle bulk and tone are normal. Muscle strength is normal.  REFLEXES: Reflexes are 2+ and symmetric at the biceps, triceps, knees, and ankles. Plantar responses are flexor.  SENSORY: Intact to light touch, pinprick and vibratory sensation are intact in fingers and toes.  COORDINATION: There is no trunk or limb dysmetria noted.  GAIT/STANCE: Posture is normal. Gait is steady  REVIEW OF SYSTEMS:  Full 14 system review of systems performed and notable only for as above All other review of systems were negative.   ALLERGIES: Allergies  Allergen  Reactions   Guaifenesin-Codeine Palpitations    Heart racing    HOME MEDICATIONS: Current Outpatient Medications  Medication Sig Dispense Refill   traZODone (DESYREL) 50 MG tablet TAKE 0.5-1 TABLETS (25-50 MG TOTAL) BY  MOUTH AT BEDTIME AS NEEDED. 90 tablet 1   atorvastatin (LIPITOR) 40 MG tablet TAKE 1 TABLET BY MOUTH EVERY DAY 30 tablet 2   Erenumab-aooe (AIMOVIG) 70 MG/ML SOAJ Inject 70 mg into the skin every 30 (thirty) days. 1.12 mL 4   glucose blood (ONE TOUCH ULTRA TEST) test strip USE AS INSTRUCTED 100 each 1   metFORMIN (GLUCOPHAGE) 1000 MG tablet TAKE 1 TABLET (1,000 MG TOTAL) BY MOUTH TWICE A DAY WITH FOOD 60 tablet 11   Multiple Vitamins-Minerals (MULTIVITAMIN WOMEN PO) Take by mouth.     Omega-3 Fatty Acids (FISH OIL PO) Take by mouth daily.     ondansetron (ZOFRAN-ODT) 4 MG disintegrating tablet Take 1 tablet (4 mg total) by mouth every 8 (eight) hours as needed for nausea or vomiting. PLACE 1 TABLET ON THE TONGUE EVERY 8 HOURS AS NEEDED FOR NAUSEA OR VOMITING 30 tablet 0   Semaglutide, 2 MG/DOSE, 8 MG/3ML SOPN Inject 2 mg as directed once a week. 3 mL 10   SUMAtriptan (IMITREX) 6 MG/0.5ML SOLN injection Inject 0.5 mLs (6 mg total) into the skin every 2 (two) hours as needed for migraine or headache. May repeat in 2 hours if headache persists or recurs. 5 mL 11   tiZANidine (ZANAFLEX) 4 MG tablet Take 1 tablet (4 mg total) by mouth every 6 (six) hours as needed for muscle spasms. 30 tablet 6   No current facility-administered medications for this visit.    PAST MEDICAL HISTORY: Past Medical History:  Diagnosis Date   Anxiety    Arthritis    Benign cyst of left breast 04/09/2019   Diabetes mellitus without complication (HCC)    type 2   Heart murmur    at birth, no problems   Hyperlipidemia    Hypertension    Migraine    Morbid obesity (HCC)    OSA on CPAP    Sleep apnea    uses cpap nightly    PAST SURGICAL HISTORY: Past Surgical History:  Procedure Laterality Date   COLONOSCOPY  2003   [place] - Normal   COLONOSCOPY  11/2019   NORMAL, Repeat q 5 yrs   KNEE ARTHROSCOPY Right    NASAL SEPTUM SURGERY     SHOULDER ARTHROSCOPY Left    TONSILLECTOMY     UPPER GASTROINTESTINAL  ENDOSCOPY  2003   in [place] - Normal    FAMILY HISTORY: Family History  Problem Relation Age of Onset   Emphysema Mother    Diabetes Father    Colon polyps Neg Hx    Rectal cancer Neg Hx    Stomach cancer Neg Hx     SOCIAL HISTORY: Social History   Socioeconomic History   Marital status: Married    Spouse name: Not on file   Number of children: Not on file   Years of education: Not on file   Highest education level: Associate degree: occupational, Scientist, product/process development, or vocational program  Occupational History   Not on file  Tobacco Use   Smoking status: Never   Smokeless tobacco: Never  Vaping Use   Vaping status: Never Used  Substance and Sexual Activity   Alcohol use: Not Currently   Drug use: Never   Sexual activity: Not Currently    Birth control/protection: Abstinence  Other Topics Concern   Not on file  Social History Narrative   Not on file   Social Drivers of Health   Financial Resource Strain: Low Risk  (07/06/2022)   Overall Financial Resource Strain (CARDIA)    Difficulty of Paying Living Expenses: Not hard at all  Food Insecurity: No Food Insecurity (07/06/2022)   Hunger Vital Sign    Worried About Running Out of Food in the Last Year: Never true    Ran Out of Food in the Last Year: Never true  Transportation Needs: No Transportation Needs (07/06/2022)   PRAPARE - Administrator, Civil Service (Medical): No    Lack of Transportation (Non-Medical): No  Physical Activity: Unknown (07/06/2022)   Exercise Vital Sign    Days of Exercise per Week: 0 days    Minutes of Exercise per Session: Not on file  Stress: No Stress Concern Present (07/06/2022)   [employer] of Occupational Health - Occupational Stress Questionnaire    Feeling of Stress : Not at all  Social Connections: Moderately Integrated (07/06/2022)   Social Connection and Isolation Panel [NHANES]    Frequency of Communication with Friends and Family: More than three times a week    Frequency  of Social Gatherings with Friends and Family: Never    Attends Religious Services: 1 to 4 times per year    Active Member of [employer] or Organizations: No    Attends Banker Meetings: Not on file    Marital Status: Married  Intimate Partner Violence: Low Risk  (03/23/2022)   Received from [facility] visits prior to 06/05/2022., [facility] visits prior to 06/05/2022.   Safety    How often does anyone, including family and friends, physically hurt you?: Never    How often does anyone, including family and friends, insult or talk down to you?: Never    How often does anyone, including family and friends, threaten you with harm?: Never    How often does anyone, including family and friends, scream or curse at you?: Never      I spent *** minutes of face-to-face and non-face-to-face time with patient.  This included previsit chart review, lab review, study review, order entry, electronic health record documentation, patient education and discussion regarding above diagnoses and treatment plan and answered all other questions to patient's satisfaction  Ihor Austin, [facility]  [facility] [address] [place], [place] [address]  Phone [phone] Fax [phone] Note: This document was prepared with digital dictation and possible smart phrase technology. Any transcriptional errors that result from this process are unintentional.

## 2023-06-01 ENCOUNTER — Ambulatory Visit (INDEPENDENT_AMBULATORY_CARE_PROVIDER_SITE_OTHER): Payer: BC Managed Care – PPO | Admitting: Adult Health

## 2023-06-01 ENCOUNTER — Encounter: Payer: Self-pay | Admitting: Adult Health

## 2023-06-01 VITALS — BP 136/72 | HR 89 | Ht 64.0 in | Wt 157.0 lb

## 2023-06-01 DIAGNOSIS — G43709 Chronic migraine without aura, not intractable, without status migrainosus: Secondary | ICD-10-CM | POA: Diagnosis not present

## 2023-06-01 DIAGNOSIS — R110 Nausea: Secondary | ICD-10-CM | POA: Diagnosis not present

## 2023-06-01 MED ORDER — NURTEC 75 MG PO TBDP: 75.0000 mg | ORAL_TABLET | ORAL | 11 refills | Status: DC | PRN

## 2023-06-01 MED ORDER — ONDANSETRON 8 MG PO TBDP: 8.0000 mg | ORAL_TABLET | Freq: Three times a day (TID) | ORAL | 11 refills | Status: DC | PRN

## 2023-06-01 MED ORDER — AJOVY 225 MG/1.5ML ~~LOC~~ SOAJ: 225.0000 mg | SUBCUTANEOUS | 11 refills | Status: DC

## 2023-06-01 NOTE — Patient Instructions (Addendum)
 Your Plan:  Switch to Ajovy monthly injection - if no benefit after 3 months, please let me know. We will then try Emgality but if after 3 months, still no benefit, we can then pursue Vyepti infusions  Try Nurtec 75mg  daily as needed for migraine rescue     Follow up in 6 months or call earlier if needed     Thank you for coming to see [place] at [facility]. I hope we have been able to provide you high quality care today.  You may receive a patient satisfaction survey over the next few weeks. We would appreciate your feedback and comments so that we may continue to improve ourselves and the health of our patients.

## 2023-06-07 ENCOUNTER — Encounter: Payer: Self-pay | Admitting: Adult Health

## 2023-06-07 DIAGNOSIS — G43709 Chronic migraine without aura, not intractable, without status migrainosus: Secondary | ICD-10-CM

## 2023-06-08 MED ORDER — AJOVY 225 MG/1.5ML ~~LOC~~ SOAJ: 225.0000 mg | SUBCUTANEOUS | 11 refills | Status: DC

## 2023-06-08 NOTE — Telephone Encounter (Signed)
 PA completed on [facility] [facility] [id number] Approved immediately   CaseId:[id number];Status:Approved;Review Type:Prior Auth;Coverage Start Date:05/09/2023;Coverage End Date:06/07/2024;

## 2023-06-08 NOTE — Telephone Encounter (Signed)
 PA completed on [facility] [id number] Approved immediately  CaseId:[id number];Status:Approved;Review Type:Prior Auth;Coverage Start Date:05/09/2023;Coverage End Date:06/07/2024;

## 2023-06-09 ENCOUNTER — Other Ambulatory Visit: Payer: Self-pay | Admitting: Neurology

## 2023-06-09 DIAGNOSIS — G43709 Chronic migraine without aura, not intractable, without status migrainosus: Secondary | ICD-10-CM

## 2023-06-09 MED ORDER — AJOVY 225 MG/1.5ML ~~LOC~~ SOAJ: 225.0000 mg | SUBCUTANEOUS | 1 refills | Status: DC

## 2023-06-09 NOTE — Telephone Encounter (Signed)
 I have sent in a refill for 3 mth supply

## 2023-06-09 NOTE — Telephone Encounter (Signed)
 [facility] called in asking for a 90 day supply of Ajovy to be sent in instead of 30 day supply. It's only $240 OOP for pt to receive the 90 day supply and $200 for the 3 day supply.

## 2023-06-13 DIAGNOSIS — E119 Type 2 diabetes mellitus without complications: Secondary | ICD-10-CM | POA: Diagnosis not present

## 2023-06-13 DIAGNOSIS — M25512 Pain in left shoulder: Secondary | ICD-10-CM | POA: Diagnosis not present

## 2023-06-17 DIAGNOSIS — E119 Type 2 diabetes mellitus without complications: Secondary | ICD-10-CM | POA: Diagnosis not present

## 2023-06-17 DIAGNOSIS — M25512 Pain in left shoulder: Secondary | ICD-10-CM | POA: Diagnosis not present

## 2023-06-21 DIAGNOSIS — M25512 Pain in left shoulder: Secondary | ICD-10-CM | POA: Diagnosis not present

## 2023-06-21 DIAGNOSIS — E119 Type 2 diabetes mellitus without complications: Secondary | ICD-10-CM | POA: Diagnosis not present

## 2023-06-24 DIAGNOSIS — M25512 Pain in left shoulder: Secondary | ICD-10-CM | POA: Diagnosis not present

## 2023-06-24 DIAGNOSIS — E119 Type 2 diabetes mellitus without complications: Secondary | ICD-10-CM | POA: Diagnosis not present

## 2023-06-29 DIAGNOSIS — E119 Type 2 diabetes mellitus without complications: Secondary | ICD-10-CM | POA: Diagnosis not present

## 2023-06-29 DIAGNOSIS — M25512 Pain in left shoulder: Secondary | ICD-10-CM | POA: Diagnosis not present

## 2023-07-01 DIAGNOSIS — M25512 Pain in left shoulder: Secondary | ICD-10-CM | POA: Diagnosis not present

## 2023-07-01 DIAGNOSIS — E119 Type 2 diabetes mellitus without complications: Secondary | ICD-10-CM | POA: Diagnosis not present

## 2023-07-06 DIAGNOSIS — M25512 Pain in left shoulder: Secondary | ICD-10-CM | POA: Diagnosis not present

## 2023-07-06 DIAGNOSIS — E119 Type 2 diabetes mellitus without complications: Secondary | ICD-10-CM | POA: Diagnosis not present

## 2023-07-08 DIAGNOSIS — E119 Type 2 diabetes mellitus without complications: Secondary | ICD-10-CM | POA: Diagnosis not present

## 2023-07-08 DIAGNOSIS — M25512 Pain in left shoulder: Secondary | ICD-10-CM | POA: Diagnosis not present

## 2023-07-11 DIAGNOSIS — E119 Type 2 diabetes mellitus without complications: Secondary | ICD-10-CM | POA: Diagnosis not present

## 2023-07-11 DIAGNOSIS — M25512 Pain in left shoulder: Secondary | ICD-10-CM | POA: Diagnosis not present

## 2023-07-13 DIAGNOSIS — M25512 Pain in left shoulder: Secondary | ICD-10-CM | POA: Diagnosis not present

## 2023-07-13 DIAGNOSIS — E119 Type 2 diabetes mellitus without complications: Secondary | ICD-10-CM | POA: Diagnosis not present

## 2023-07-30 ENCOUNTER — Other Ambulatory Visit: Payer: Self-pay | Admitting: Family Medicine

## 2023-07-30 DIAGNOSIS — F5101 Primary insomnia: Secondary | ICD-10-CM

## 2023-08-08 DIAGNOSIS — M25512 Pain in left shoulder: Secondary | ICD-10-CM | POA: Diagnosis not present

## 2023-08-08 DIAGNOSIS — E119 Type 2 diabetes mellitus without complications: Secondary | ICD-10-CM | POA: Diagnosis not present

## 2023-08-10 DIAGNOSIS — E119 Type 2 diabetes mellitus without complications: Secondary | ICD-10-CM | POA: Diagnosis not present

## 2023-08-10 DIAGNOSIS — M25512 Pain in left shoulder: Secondary | ICD-10-CM | POA: Diagnosis not present

## 2023-08-11 ENCOUNTER — Encounter: Payer: Self-pay | Admitting: Family Medicine

## 2023-08-22 DIAGNOSIS — E119 Type 2 diabetes mellitus without complications: Secondary | ICD-10-CM | POA: Diagnosis not present

## 2023-08-22 DIAGNOSIS — M25512 Pain in left shoulder: Secondary | ICD-10-CM | POA: Diagnosis not present

## 2023-09-01 DIAGNOSIS — E119 Type 2 diabetes mellitus without complications: Secondary | ICD-10-CM | POA: Diagnosis not present

## 2023-09-01 DIAGNOSIS — M25512 Pain in left shoulder: Secondary | ICD-10-CM | POA: Diagnosis not present

## 2023-09-16 ENCOUNTER — Ambulatory Visit: Admitting: Family Medicine

## 2023-11-07 ENCOUNTER — Other Ambulatory Visit: Payer: Self-pay | Admitting: Adult Health

## 2023-11-07 DIAGNOSIS — G43709 Chronic migraine without aura, not intractable, without status migrainosus: Secondary | ICD-10-CM

## 2023-11-08 ENCOUNTER — Other Ambulatory Visit: Payer: Self-pay

## 2023-11-08 MED ORDER — ATORVASTATIN CALCIUM 40 MG PO TABS: 40.0000 mg | ORAL_TABLET | Freq: Every day | ORAL | 0 refills | Status: DC

## 2023-11-14 LAB — HM DIABETES EYE EXAM

## 2023-11-16 ENCOUNTER — Encounter: Payer: Self-pay | Admitting: Urgent Care

## 2023-11-16 ENCOUNTER — Ambulatory Visit (INDEPENDENT_AMBULATORY_CARE_PROVIDER_SITE_OTHER): Admitting: Urgent Care

## 2023-11-16 VITALS — BP 128/64 | HR 100 | Resp 20 | Ht 64.0 in | Wt 252.0 lb

## 2023-11-16 DIAGNOSIS — F5101 Primary insomnia: Secondary | ICD-10-CM | POA: Diagnosis not present

## 2023-11-16 DIAGNOSIS — Z7984 Long term (current) use of oral hypoglycemic drugs: Secondary | ICD-10-CM

## 2023-11-16 DIAGNOSIS — I350 Nonrheumatic aortic (valve) stenosis: Secondary | ICD-10-CM | POA: Diagnosis not present

## 2023-11-16 DIAGNOSIS — Z1231 Encounter for screening mammogram for malignant neoplasm of breast: Secondary | ICD-10-CM

## 2023-11-16 DIAGNOSIS — E1165 Type 2 diabetes mellitus with hyperglycemia: Secondary | ICD-10-CM

## 2023-11-16 DIAGNOSIS — E1169 Type 2 diabetes mellitus with other specified complication: Secondary | ICD-10-CM | POA: Diagnosis not present

## 2023-11-16 DIAGNOSIS — Z7985 Long-term (current) use of injectable non-insulin antidiabetic drugs: Secondary | ICD-10-CM

## 2023-11-16 DIAGNOSIS — E785 Hyperlipidemia, unspecified: Secondary | ICD-10-CM | POA: Diagnosis not present

## 2023-11-16 LAB — POCT GLYCOSYLATED HEMOGLOBIN (HGB A1C): Hemoglobin A1C: 11.6 % — AB (ref 4.0–5.6)

## 2023-11-16 MED ORDER — TRAZODONE HCL 50 MG PO TABS
50.0000 mg | ORAL_TABLET | Freq: Every day | ORAL | 1 refills | Status: DC
Start: 2023-11-16 — End: 2024-02-23

## 2023-11-16 MED ORDER — TRESIBA FLEXTOUCH 200 UNIT/ML ~~LOC~~ SOPN: 10.0000 [IU] | PEN_INJECTOR | SUBCUTANEOUS | 0 refills | Status: DC

## 2023-11-16 MED ORDER — INSULIN LISPRO (1 UNIT DIAL) 100 UNIT/ML (KWIKPEN): PEN_INJECTOR | SUBCUTANEOUS | 2 refills | Status: DC

## 2023-11-16 MED ORDER — FREESTYLE LIBRE 3 PLUS SENSOR MISC: 0 refills | Status: AC

## 2023-11-16 NOTE — Progress Notes (Signed)
 Established Patient Office Visit  Subjective:  Patient ID: Morgan Duran, female    DOB: 28-Aug-1965  Age: 58 y.o. MRN: [id number]  Chief Complaint  Patient presents with   Medical Management of Chronic Issues    T2DM last A1C 8.8 01/14/23    HPI  Discussed the use of AI scribe software for clinical note transcription with the patient, who gave verbal consent to proceed.  History of Present Illness   Morgan Duran is a 58 year old female with diabetes who presents for follow-up of elevated A1c levels.  She has a history of diabetes and is here for follow-up due to elevated A1c levels. Her A1c today is 11.6. She attributes this increase to discontinuing Ozempic  since last Christmas. She stopped taking Ozempic  because she traveled to [place]  and did not bring it with her, and she was concerned about restarting at a high dose due to potential side effects. She has been managing her diabetes with metformin , 1000 mg twice daily, and a statin.  Her A1c levels have fluctuated over the years, with a previous reading of 8.8 in October 2023. She has experienced normal readings in June and December 2021. 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 She monitors her blood sugar at home using glucose test strips but has slowed down due to the high readings being 'depressing.'  She has a history of using insulin  in the past, which she stopped when her numbers improved. She does not recall the specific type of insulin  used but remembers it was not in pen form and required drawing from a bottle. She has not used insulin  since 2023 when her results were excellent. (A1C 6.6% at the time of insulin  use).  In addition to diabetes, she manages migraines with medications prescribed by a neurologist, including Ajovy , Nurtec, and tizanidine . She also takes trazodone  for sleep, at a dose of 50 mg, which she finds  effective.  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 She works Monday through Friday from 8 to 5 and does not have sick or vacation time.  She has a known heart murmur due to aortic stenosis, with a valve surface area of 1.65 cm. No severe shortness of breath, chest pain, or lightheadedness.  She also has cysts in one of her breasts and had a mammogram in May 2023, with a recommendation for annual screening.      Patient Active Problem List   Diagnosis Date Noted   Primary insomnia 01/14/2023   Acute cough 12/08/2022   Nasal congestion 12/08/2022   Lightheadedness 12/01/2021   Encounter for preoperative assessment for noncoronary cardiac surgery 11/05/2021   Essential hypertension 11/05/2021   Benign cyst of left breast 04/09/2019   Statin declined 12/24/2018   History of osteoporosis 12/24/2018   Colon cancer screening 05/29/2018   Diabetic eye exam (HCC) 05/29/2018   Diabetic nephropathy associated with type 2 diabetes mellitus (HCC) 05/26/2018   Diabetic polyneuropathy associated with type 2 diabetes mellitus (HCC) 05/26/2018   Chronic migraine w/o aura w/o status migrainosus, not intractable 09/30/2017   Class 3 severe obesity due to excess calories with serious comorbidity and body mass index (BMI) of 40.0 to 44.9 in adult 04/03/2017   Hypertension associated with diabetes (HCC) 04/03/2017   DJD (degenerative joint disease) 03/23/2017   Aortic stenosis, mild 03/23/2017  Primary localized osteoarthritis of right knee 03/22/2017   OSA on CPAP 01/15/2017   Systolic murmur 01/15/2017   Uncontrolled type 2 diabetes mellitus with hyperglycemia (HCC) 05/31/2016   Past Medical History:  Diagnosis Date   Anxiety    Arthritis    Benign cyst of left breast 04/09/2019   Diabetes mellitus without complication (HCC)    type 2   Heart murmur    at birth, no problems   Hyperlipidemia    Hypertension     Migraine    Morbid obesity (HCC)    OSA on CPAP    Sleep apnea    uses cpap nightly   Past Surgical History:  Procedure Laterality Date   COLONOSCOPY  2003   [place]  - Normal   COLONOSCOPY  11/2019   NORMAL, Repeat q 5 yrs   KNEE ARTHROSCOPY Right    NASAL SEPTUM SURGERY     SHOULDER ARTHROSCOPY Left    TONSILLECTOMY     UPPER GASTROINTESTINAL ENDOSCOPY  2003   in [place]  - Normal   Social History   Tobacco Use   Smoking status: Never   Smokeless tobacco: Never  Vaping Use   Vaping status: Never Used  Substance Use Topics   Alcohol use: Not Currently   Drug use: Never      ROS: as noted in HPI  Objective:     BP 128/64   Pulse 100   Resp 20   Ht 5' 4 (1.626 m)   Wt 252 lb (114.3 kg)   SpO2 98%   BMI 43.26 kg/m  BP Readings from Last 3 Encounters:  11/16/23 128/64  06/01/23 136/72  02/11/23 138/88   Wt Readings from Last 3 Encounters:  11/16/23 252 lb (114.3 kg)  06/01/23 157 lb (71.2 kg)  02/11/23 252 lb 12 oz (114.6 kg)      Physical Exam Vitals and nursing note reviewed.  Constitutional:      General: She is not in acute distress.    Appearance: Normal appearance. She is not ill-appearing, toxic-appearing or diaphoretic.  HENT:     Head: Normocephalic and atraumatic.     Right Ear: Tympanic membrane, ear canal and external ear normal. There is no impacted cerumen.     Left Ear: Tympanic membrane, ear canal and external ear normal. There is no impacted cerumen.     Nose: Nose normal.     Mouth/Throat:     Mouth: Mucous membranes are moist.     Pharynx: Oropharynx is clear. No oropharyngeal exudate or posterior oropharyngeal erythema.  Eyes:     General: No scleral icterus.       Right eye: No discharge.        Left eye: No discharge.     Extraocular Movements: Extraocular movements intact.     Pupils: Pupils are equal, round, and reactive to light.  Neck:     Thyroid : No thyroid  mass, thyromegaly or thyroid  tenderness.  Cardiovascular:      Rate and Rhythm: Normal rate and regular rhythm.     Pulses: Normal pulses.     Heart sounds: Murmur heard.  Pulmonary:     Effort: Pulmonary effort is normal. No respiratory distress.     Breath sounds: Normal breath sounds. No stridor. No wheezing or rhonchi.  Musculoskeletal:     Cervical back: Normal range of motion and neck supple. No rigidity or tenderness.     Right lower leg: No edema.     Left lower leg: No edema.  Lymphadenopathy:     Cervical: No cervical adenopathy.  Skin:    General: Skin is warm and dry.     Coloration: Skin is not jaundiced.     Findings: No bruising, erythema or rash.  Neurological:     General: No focal deficit present.     Mental Status: She is alert and oriented to person, place, and time.  Psychiatric:        Mood and Affect: Mood normal.        Behavior: Behavior normal.      POCT HgB A1C  Result Value Ref Range   Hemoglobin A1C 11.6 (A) 4.0 - 5.6 %   HbA1c POC (<> result, manual entry)     HbA1c, POC (prediabetic range)     HbA1c, POC (controlled diabetic range)    Results for orders placed or performed in visit on 11/16/23  HM DIABETES EYE EXAM  Result Value Ref Range   HM Diabetic Eye Exam Retinopathy (A) No Retinopathy     The 10-year ASCVD risk score (Arnett DK, et al., 2019) is: 3.6%  Assessment & Plan:  Uncontrolled type 2 diabetes mellitus with hyperglycemia (HCC) -     POCT glycosylated hemoglobin (Hb A1C) -     Tresiba  FlexTouch; Inject 10 Units into the skin daily. Total daily dose = 0.2-0.5 units/kg/day---> 50% Basal/50% Bolus  Dispense: 3 mL; Refill: 0 -     FreeStyle Libre 3 Plus Sensor; Change sensor every 15 days.  Dispense: 6 each; Refill: 0 -     CBC with Differential/Platelet -     TSH -     Lipid panel -     Comprehensive metabolic panel with GFR -     Insulin  Lispro (1 Unit Dial ); Inject per sliding scale directions three times daily not to exceed 30 units daily  Dispense: 3 mL; Refill: 2  Primary  insomnia -     traZODone  HCl; Take 1 tablet (50 mg total) by mouth at bedtime.  Dispense: 90 tablet; Refill: 1  Hyperlipidemia associated with type 2 diabetes mellitus (HCC) -     Lipid panel  Encounter for screening mammogram for malignant neoplasm of breast -     3D Screening Mammogram, Left and Right; Future  Aortic stenosis, mild  Assessment and Plan    Type 2 diabetes mellitus with poor glycemic control A1c elevated at 11.6%. Previously better controlled on insulin . Currently on max dose metformin , has been off Ozempic  over 8 months. - Start Tresiba  10 units nightly, increase by 2 units weekly until fasting glucose is 120 mg/dL. - Prescribe Humalog  for sliding scale insulin  use. - Discontinue metformin  upon starting Tresiba . - Prescribe Freestyle Libre for continuous glucose monitoring, pending insurance approval. - Educated on [employer] app for glucose monitoring.  Aortic stenosis Known aortic stenosis with valve surface area of 1.65 cm. No symptoms present. - Repeat echocardiogram in May 2026.  Migraine Managed with Ajovy , Nurtec, and tizanidine . Effective in controlling migraines.  Insomnia Managed with trazodone  50 mg for sleep, effective. - Continue trazodone  50 mg for sleep.         Return in about 3 months (around 02/16/2024).   Benton LITTIE Gave, PA

## 2023-11-16 NOTE — Patient Instructions (Addendum)
 Start the tresiba  at 10 units nightly. Take for one week. Check your fasting glucose - if not at 120, then: Increase to 12 units nightly for one week. Check fasting glucose, if not at 120, then Increase to 14 units nightly for one week.  Continue to increase tresiba  by 2 units weekly until fasting glucose goals achieved.  Please use the humalog  as a raid acting insulin  per the sliding scale below. Do this to offset highs related to your meals:    Please use to Freestyle Libre sensor to help monitor glucose levels,  I have refilled your trazodone  50mg  for nightly use.  Please schedule your mammogram.  Return in 3 months, sooner if needed

## 2023-11-17 ENCOUNTER — Encounter: Payer: Self-pay | Admitting: Adult Health

## 2023-11-17 ENCOUNTER — Encounter: Payer: Self-pay | Admitting: Urgent Care

## 2023-11-17 ENCOUNTER — Ambulatory Visit: Payer: Self-pay | Admitting: Urgent Care

## 2023-11-17 DIAGNOSIS — E1165 Type 2 diabetes mellitus with hyperglycemia: Secondary | ICD-10-CM

## 2023-11-17 LAB — COMPREHENSIVE METABOLIC PANEL WITH GFR
ALT: 24 IU/L (ref 0–32)
AST: 20 IU/L (ref 0–40)
Albumin: 4.4 g/dL (ref 3.8–4.9)
Alkaline Phosphatase: 123 IU/L — ABNORMAL HIGH (ref 44–121)
BUN/Creatinine Ratio: 22 (ref 9–23)
BUN: 17 mg/dL (ref 6–24)
Bilirubin Total: 0.4 mg/dL (ref 0.0–1.2)
CO2: 24 mmol/L (ref 20–29)
Calcium: 9.8 mg/dL (ref 8.7–10.2)
Chloride: 93 mmol/L — ABNORMAL LOW (ref 96–106)
Creatinine, Ser: 0.79 mg/dL (ref 0.57–1.00)
Globulin, Total: 2.5 g/dL (ref 1.5–4.5)
Glucose: 450 mg/dL — ABNORMAL HIGH (ref 70–99)
Potassium: 5.2 mmol/L (ref 3.5–5.2)
Sodium: 132 mmol/L — ABNORMAL LOW (ref 134–144)
Total Protein: 6.9 g/dL (ref 6.0–8.5)
eGFR: 87 mL/min/1.73 (ref >59)

## 2023-11-17 LAB — CBC WITH DIFFERENTIAL/PLATELET
Basophils Absolute: 0.1 x10E3/uL (ref 0.0–0.2)
Basos: 1 %
EOS (ABSOLUTE): 0.2 x10E3/uL (ref 0.0–0.4)
Eos: 2 %
Hematocrit: 43.4 % (ref 34.0–46.6)
Hemoglobin: 13.7 g/dL (ref 11.1–15.9)
Immature Grans (Abs): 0 x10E3/uL (ref 0.0–0.1)
Immature Granulocytes: 0 %
Lymphocytes Absolute: 1.6 x10E3/uL (ref 0.7–3.1)
Lymphs: 22 %
MCH: 29.6 pg (ref 26.6–33.0)
MCHC: 31.6 g/dL (ref 31.5–35.7)
MCV: 94 fL (ref 79–97)
Monocytes Absolute: 0.5 x10E3/uL (ref 0.1–0.9)
Monocytes: 7 %
Neutrophils Absolute: 5.1 x10E3/uL (ref 1.4–7.0)
Neutrophils: 68 %
Platelets: 331 x10E3/uL (ref 150–450)
RBC: 4.63 x10E6/uL (ref 3.77–5.28)
RDW: 12.2 % (ref 11.7–15.4)
WBC: 7.4 x10E3/uL (ref 3.4–10.8)

## 2023-11-17 LAB — LIPID PANEL
Chol/HDL Ratio: 2.8 ratio (ref 0.0–4.4)
Cholesterol, Total: 152 mg/dL (ref 100–199)
HDL: 55 mg/dL (ref >39)
LDL Chol Calc (NIH): 70 mg/dL (ref 0–99)
Triglycerides: 160 mg/dL — ABNORMAL HIGH (ref 0–149)
VLDL Cholesterol Cal: 27 mg/dL (ref 5–40)

## 2023-11-17 LAB — TSH: TSH: 1.79 u[IU]/mL (ref 0.450–4.500)

## 2023-11-18 ENCOUNTER — Telehealth: Payer: Self-pay | Admitting: Urgent Care

## 2023-11-18 MED ORDER — OZEMPIC (0.25 OR 0.5 MG/DOSE) 2 MG/3ML ~~LOC~~ SOPN: 0.2500 mg | PEN_INJECTOR | SUBCUTANEOUS | 2 refills | Status: DC

## 2023-11-18 NOTE — Telephone Encounter (Signed)
 Copied from CRM [id number]. Topic: Clinical - Prescription Issue >> Nov 18, 2023  4:31 PM Miquel SAILOR wrote: Reason for CRM: Continuous Glucose Sensor (FREESTYLE LIBRE 3-PLUS SENSOR) MISC [facility] stated for medication Prior Auth needed PCP has to call [phone] and if medication needed as STAT pls state that as well. Any questions [phone]

## 2023-11-22 NOTE — Telephone Encounter (Signed)
 Could you please call and try to get this approved. Pt was changed to insulin  therapy, and multiple daily dosing of insulin  thus continuous glucose monitoring is appropriate

## 2023-11-23 ENCOUNTER — Encounter: Payer: Self-pay | Admitting: Adult Health

## 2023-11-23 ENCOUNTER — Ambulatory Visit (INDEPENDENT_AMBULATORY_CARE_PROVIDER_SITE_OTHER): Admitting: Adult Health

## 2023-11-23 VITALS — BP 120/61 | HR 91 | Ht 64.25 in | Wt 260.0 lb

## 2023-11-23 DIAGNOSIS — G43709 Chronic migraine without aura, not intractable, without status migrainosus: Secondary | ICD-10-CM

## 2023-11-23 MED ORDER — EMGALITY 120 MG/ML ~~LOC~~ SOAJ: 120.0000 mg | SUBCUTANEOUS | 11 refills | Status: DC

## 2023-11-23 NOTE — Progress Notes (Signed)
 ASSESSMENT AND PLAN  Morgan Duran is a 58 y.o. female   Chronic migraine  Prevention: Start Emgality  monthly injection beginning of September, provided sample today for loading dose. On Emgality  previously with benefit in 2020. If no benefit this time around, patient interested in pursuing Vyepti  Rescue: continue Nurtec 75 mg daily as needed.  Max dose 75 mg per 24-hour, Was provided samples for Ubrelvy, advised to repeat after 2 hrs if needed. Advised to call if beneficial without recurrent migraine the following day (which she is currently experiencing with Nurtec) and official prescription will be provided.  Continue Zofran  as needed previously tried/failed: Sumatriptan  PO and SQ, rizatriptan , lamotrigine, propranolol, amitriptyline, topamax, Aimovig , Ajovy , Emgality     Follow-up in 6 months or call earlier if needed    DIAGNOSTIC DATA (LABS, IMAGING, TESTING) - I reviewed patient records, labs, notes, testing and imaging myself where available.   MEDICAL HISTORY:  Update 11/23/2023 JM: Patient returns for 9-month follow-up visit.  At prior visit, she was switched from Aimovig  to Ajovy  and initiated Nurtec for rescue.  She has been keeping better track of her migraine frequency.  She initiated Ajovy  in March.  She had 2 migraines in April but have gradually increased since then.  She had 5 migraines in May and June, 7 in July and 6 so far this month.  Use of Nurtec with benefit and will abort migraine but can have recurrent migraine the following day.  She does feel increased frequency in July due to frequent storms as barometric pressure changes are a known trigger.  Last Ajovy  injection beginning of August.  Did discuss further treatment options such as transitioning to Emgality  but patient reports she believes she was previously on Emgality .  Further review of epic does confirm previously being on Emgality  in 2020 by PCP, PCP noted great improvement of migraines and she  eventually self discontinued as migraines greatly improved and did not feel preventative therapy needed.  She is interested in restarting this as it was previously beneficial.     Update 06/01/2023 JM: Patient returns for 24-month follow-up visit. She reports some improvement of migraines on Aimovig  but still occur about 4 days per month and can last up to 2 days. Can have slurred speech and fatigue when migraines present.  She also has significant nausea, Zofran  4 mg helps some but not completely.  Migraines are debilitating and unable to function when present.  Has been using Imitrex  subcu which usually works but can take a while to take effect and sometimes no benefit.  Will take tizanidine  when she is at home as it can cause fatigue, she will also use ice packs on her head and shoulders and a massage wand.  Migraines can be triggered by the heat, strong smells and barometric changes.  Consult visit 11/19/2022 Dr. Onita: Morgan Duran, is a 58 year old female seen in request by her primary care doctor Bevin Asal for evaluation of chronic migraine, initial evaluation November 19, 2022   I reviewed and summarized the referring note. PMHX Chronic migraine DM Obesity HLD OSA    She started to have migraines since 2003, typical migraine are retro-orbital area severe pounding headache with light, noise, smell sensitivity, nauseous, lasting for hours, often extending to her neck region, occasionally during intense headache she also described difficulty thinking, word finding difficulties, slurred speech  Her migraine usually cost her around her menstruation, she went through menopause, still have frequent headaches, 1-2 times each week at  least, other trigger for her migraine are weather change, certain heavy starchy food, cheese, stress, sleep deprivation, strong smells,  Over the years, she has tried different preventive medications with suboptimal response such as propranolol, lamotrigine,  amitriptyline, Topamax,  She tried Imitrex  tablets in the past, did not help her headache much, over the years, she was given Maxalt  dissolvable, helped her a 50% of the time, often limited because of her severe nausea, vomiting, sometimes she woke up with severe headache, also had to go to urgent care for cocktail,      PHYSICAL EXAM:   Vitals:   11/23/23 0821  BP: 120/61  Pulse: 91  Weight: 260 lb (117.9 kg)  Height: 5' 4.25 (1.632 m)    Body mass index is 44.28 kg/m.  PHYSICAL EXAMNIATION:  Gen: NAD, very pleasant middle-age Caucasian female, conversant, well nourised, well groomed                     Cardiovascular: Regular rate rhythm, no peripheral edema, warm, nontender. Eyes: Conjunctivae clear without exudates or hemorrhage Neck: Supple, no carotid bruits. Pulmonary: Clear to auscultation bilaterally   NEUROLOGICAL EXAM:  MENTAL STATUS: Speech/cognition: Obese, awake, alert, oriented to history taking and casual conversation CRANIAL NERVES: CN II: Visual fields are full to confrontation. Pupils are round equal and briskly reactive to light. CN III, IV, VI: extraocular movement are normal. No ptosis. CN V: Facial sensation is intact to light touch CN VII: Face is symmetric with normal eye closure  CN VIII: Hearing is normal to causal conversation. CN IX, X: Phonation is normal. CN XI: Head turning and shoulder shrug are intact CN XII: Narrow oropharyngeal space  MOTOR: There is no pronator drift of out-stretched arms. Muscle bulk and tone are normal. Muscle strength is normal.  REFLEXES: Reflexes are 2+ and symmetric at the biceps, triceps, knees, and ankles. Plantar responses are flexor.  SENSORY: Intact to light touch, pinprick and vibratory sensation are intact in fingers and toes.  COORDINATION: There is no trunk or limb dysmetria noted.  GAIT/STANCE: Posture is normal. Gait is steady    REVIEW OF SYSTEMS:  Full 14 system review of systems  performed and notable only for as above All other review of systems were negative.   ALLERGIES: Allergies  Allergen Reactions   Guaifenesin -Codeine  Palpitations    Heart racing    HOME MEDICATIONS: Current Outpatient Medications  Medication Sig Dispense Refill   atorvastatin  (LIPITOR) 40 MG tablet Take 1 tablet (40 mg total) by mouth daily. 30 tablet 0   Galcanezumab -gnlm (EMGALITY ) 120 MG/ML SOAJ Inject 120 mg into the skin every 30 (thirty) days. 1.12 mL 11   insulin  degludec (TRESIBA  FLEXTOUCH) 200 UNIT/ML FlexTouch Pen Inject 10 Units into the skin daily. Total daily dose = 0.2-0.5 units/kg/day---> 50% Basal/50% Bolus 3 mL 0   insulin  lispro (HUMALOG  KWIKPEN) 100 UNIT/ML KwikPen Inject per sliding scale directions three times daily not to exceed 30 units daily 3 mL 2   Multiple Vitamins-Minerals (MULTIVITAMIN WOMEN PO) Take by mouth.     Omega-3 Fatty Acids (FISH OIL PO) Take by mouth daily.     ondansetron  (ZOFRAN -ODT) 8 MG disintegrating tablet Take 1 tablet (8 mg total) by mouth every 8 (eight) hours as needed for nausea or vomiting. 20 tablet 11   Rimegepant Sulfate (NURTEC) 75 MG TBDP Take 1 tablet (75 mg total) by mouth as needed. 15 tablet 11   Semaglutide ,0.25 or 0.5MG /DOS, (OZEMPIC , 0.25 OR 0.5 MG/DOSE,) 2 MG/3ML  SOPN Inject 0.25 mg into the skin once a week. 3 mL 2   tiZANidine  (ZANAFLEX ) 4 MG tablet Take 1 tablet (4 mg total) by mouth every 6 (six) hours as needed for muscle spasms. 30 tablet 6   traZODone  (DESYREL ) 50 MG tablet Take 1 tablet (50 mg total) by mouth at bedtime. 90 tablet 1   Continuous Glucose Sensor (FREESTYLE LIBRE 3 PLUS SENSOR) MISC Change sensor every 15 days. 6 each 0   glucose blood (ONE TOUCH ULTRA TEST) test strip USE AS INSTRUCTED 100 each 1   No current facility-administered medications for this visit.    PAST MEDICAL HISTORY: Past Medical History:  Diagnosis Date   Anxiety    Arthritis    Benign cyst of left breast 04/09/2019   Diabetes  mellitus without complication (HCC)    type 2   Heart murmur    at birth, no problems   Hyperlipidemia    Hypertension    Migraine    Morbid obesity (HCC)    OSA on CPAP    Sleep apnea    uses cpap nightly    PAST SURGICAL HISTORY: Past Surgical History:  Procedure Laterality Date   COLONOSCOPY  2003   [place]  - Normal   COLONOSCOPY  11/2019   NORMAL, Repeat q 5 yrs   KNEE ARTHROSCOPY Right    NASAL SEPTUM SURGERY     SHOULDER ARTHROSCOPY Left    TONSILLECTOMY     UPPER GASTROINTESTINAL ENDOSCOPY  2003   in [place]  - Normal    FAMILY HISTORY: Family History  Problem Relation Age of Onset   Emphysema Mother    Diabetes Father    Colon polyps Neg Hx    Rectal cancer Neg Hx    Stomach cancer Neg Hx     SOCIAL HISTORY: Social History   Socioeconomic History   Marital status: Married    Spouse name: Not on file   Number of children: Not on file   Years of education: Not on file   Highest education level: Associate degree: occupational, Scientist, product/process development, or vocational program  Occupational History   Not on file  Tobacco Use   Smoking status: Never   Smokeless tobacco: Never  Vaping Use   Vaping status: Never Used  Substance and Sexual Activity   Alcohol use: Not Currently   Drug use: Never   Sexual activity: Not Currently    Birth control/protection: Abstinence  Other Topics Concern   Not on file  Social History Narrative   Not on file   Social Drivers of Health   Financial Resource Strain: Low Risk  (07/06/2022)   Overall Financial Resource Strain (CARDIA)    Difficulty of Paying Living Expenses: Not hard at all  Food Insecurity: No Food Insecurity (07/06/2022)   Hunger Vital Sign    Worried About Running Out of Food in the Last Year: Never true    Ran Out of Food in the Last Year: Never true  Transportation Needs: No Transportation Needs (07/06/2022)   PRAPARE - Administrator, Civil Service (Medical): No    Lack of Transportation (Non-Medical): No   Physical Activity: Unknown (07/06/2022)   Exercise Vital Sign    Days of Exercise per Week: 0 days    Minutes of Exercise per Session: Not on file  Stress: No Stress Concern Present (07/06/2022)   [employer] of Occupational Health - Occupational Stress Questionnaire    Feeling of Stress : Not at all  Social Connections: Moderately  Integrated (07/06/2022)   Social Connection and Isolation Panel    Frequency of Communication with Friends and Family: More than three times a week    Frequency of Social Gatherings with Friends and Family: Never    Attends Religious Services: 1 to 4 times per year    Active Member of [employer] or Organizations: No    Attends Banker Meetings: Not on file    Marital Status: Married  Intimate Partner Violence: Low Risk  (03/23/2022)   Received from [facility] visits prior to 06/05/2022.   Safety    How often does anyone, including family and friends, physically hurt you?: Never    How often does anyone, including family and friends, insult or talk down to you?: Never    How often does anyone, including family and friends, threaten you with harm?: Never    How often does anyone, including family and friends, scream or curse at you?: Never      I personally spent a total of 30 minutes in the care of the patient today including preparing to see the patient, performing a medically appropriate exam/evaluation, counseling and educating, placing orders, and documenting clinical information in the EHR.   Harlene Bogaert, AGNP-BC  [facility] [address] [place], [place] [address]  Phone [phone] Fax [phone] Note: This document was prepared with digital dictation and possible smart phrase technology. Any transcriptional errors that result from this process are unintentional.

## 2023-11-23 NOTE — Patient Instructions (Signed)
 Your Plan:  Will switch from Ajovy  to Emgality  - if you do not see any great benefit after your 3rd injection, please let me know  Try [place] to take at onset of migraine headache, you can repeat after 2 hours if needed. If this is helpful and you do not experience a recurrent migraine the following day, please let me know for an official prescription  Please do not combine Nurtec and Ubrelvy  Continue to track your migraine headaches - this is very helpful!     Follow up in 6 months or call earlier if needed     Thank you for coming to see us  at [facility]. I hope we have been able to provide you high quality care today.  You may receive a patient satisfaction survey over the next few weeks. We would appreciate your feedback and comments so that we may continue to improve ourselves and the health of our patients.

## 2023-11-28 ENCOUNTER — Telehealth: Payer: Self-pay

## 2023-11-28 ENCOUNTER — Other Ambulatory Visit (HOSPITAL_COMMUNITY): Payer: Self-pay

## 2023-11-28 NOTE — Telephone Encounter (Signed)
 Pharmacy Patient Advocate Encounter  Received notification from [facility] that Prior Authorization for Emgality  120mg /ml autoinjector has been APPROVED from 11/28/2023 to 11/27/2024. Ran test claim, Copay is $0. This test claim was processed through [facility]- copay amounts may vary at other pharmacies due to pharmacy/plan contracts, or as the patient moves through the different stages of their insurance plan.   PA #/Case ID/Reference #: [id number]  KEY: [id number]

## 2023-11-30 ENCOUNTER — Other Ambulatory Visit (HOSPITAL_COMMUNITY): Payer: Self-pay

## 2023-11-30 ENCOUNTER — Telehealth: Payer: Self-pay | Admitting: Urgent Care

## 2023-11-30 NOTE — Telephone Encounter (Signed)
 Patient is calling to check status of prior authorization on FREESTYLE LIBRE 3-PLUS SENSOR please advise

## 2023-11-30 NOTE — Telephone Encounter (Signed)
 Duplicate

## 2023-11-30 NOTE — Telephone Encounter (Signed)
 Patient is calling to check status of Prior Authorization on FREESTYLE LIBRE 3-PLUS SENSOR please advise

## 2023-11-30 NOTE — Telephone Encounter (Addendum)
 Initiated PA via Latent. PA cancelled due to PA not required.   Ran test claim for FreeStyle Libre 3 Plus Sensor.  Currently a quantity of 2 is a 30 day supply and the co-pay is $0 . Per test claim, max days supply allowed is 30.   This test claim was processed through [facility]- copay amounts may vary at other pharmacies due to pharmacy/plan contracts, or as the patient moves through the different stages of their insurance plan.

## 2023-12-01 ENCOUNTER — Ambulatory Visit

## 2023-12-01 DIAGNOSIS — Z1231 Encounter for screening mammogram for malignant neoplasm of breast: Secondary | ICD-10-CM | POA: Diagnosis not present

## 2023-12-05 ENCOUNTER — Other Ambulatory Visit: Payer: Self-pay | Admitting: Urgent Care

## 2023-12-06 ENCOUNTER — Encounter: Payer: Self-pay | Admitting: Sports Medicine

## 2023-12-13 ENCOUNTER — Other Ambulatory Visit: Payer: Self-pay

## 2023-12-14 ENCOUNTER — Ambulatory Visit: Payer: BC Managed Care – PPO | Admitting: Adult Health

## 2023-12-16 ENCOUNTER — Other Ambulatory Visit: Payer: Self-pay | Admitting: Urgent Care

## 2023-12-16 DIAGNOSIS — E1165 Type 2 diabetes mellitus with hyperglycemia: Secondary | ICD-10-CM

## 2023-12-20 MED ORDER — UBRELVY 100 MG PO TABS: 100.0000 mg | ORAL_TABLET | ORAL | 11 refills | Status: AC | PRN

## 2023-12-20 NOTE — Addendum Note (Signed)
 Addended by: WHITFIELD RAISIN L on: 12/20/2023 04:01 PM   Modules accepted: Orders

## 2024-01-02 ENCOUNTER — Other Ambulatory Visit (HOSPITAL_COMMUNITY): Payer: Self-pay

## 2024-01-02 ENCOUNTER — Telehealth: Payer: Self-pay

## 2024-01-02 NOTE — Telephone Encounter (Signed)
 Can you assist with this PA for Ubrelvy . It doesn't appear that we have received a PA request yet. Thank you!

## 2024-01-02 NOTE — Telephone Encounter (Signed)
 Pharmacy Patient Advocate Encounter   Received notification from Patient Advice Request messages that prior authorization for Ubrelvy  100mg  Tablet is required/requested.   Insurance verification completed.   The patient is insured through [employer] .   Per test claim: PA required; PA started via CoverMyMeds. KEY [id number] . Waiting for clinical questions to populate.

## 2024-01-11 ENCOUNTER — Other Ambulatory Visit (HOSPITAL_COMMUNITY): Payer: Self-pay

## 2024-01-11 NOTE — Telephone Encounter (Signed)
 I received a request for add information-I will complete and fax back to [facility].

## 2024-01-11 NOTE — Telephone Encounter (Signed)
 Pharmacy Patient Advocate Encounter  Received notification from [facility] that Prior Authorization for Ubrelvy  has been APPROVED from 01/11/2024 to 01/10/2025 with QUANTITY LIMIT of 10 for a 30 day supply.   PA #/Case ID/Reference #: CaseId: [id number]

## 2024-01-11 NOTE — Telephone Encounter (Signed)
 Faxed form and clinicals and stated on form 16 tablets for 30 day supply. Faxed to [phone].

## 2024-01-11 NOTE — Telephone Encounter (Signed)
 Jessica from [facility] called to request Verbal PA for Pt Medication Ubrogepant  (UBRELVY ) 100 MG TABS   Harlene callback number is  [phone]

## 2024-01-12 ENCOUNTER — Other Ambulatory Visit (HOSPITAL_COMMUNITY): Payer: Self-pay

## 2024-01-12 NOTE — Telephone Encounter (Signed)
   [facility] sent fax stating coverage review is not available for quantity limits. I will outreach [facility] to see if I can get some clarification from them verbally.

## 2024-01-12 NOTE — Telephone Encounter (Addendum)
 I called Insurance and they state they do not do quantity exceptions, however they have an appeal process which is where the provider will need to write a letter of medical necessity as to why the patient will need more than the allowed monthly amount. They stated that during this time while processing the appeal she can fill the 10 tablets to get by. They stated that once all information has been received from the provider the turn around time is 15 business days. They stated they sent PT an email from the insurance company concerning the appeal. They are faxing me the paperwork.  The Case ID for this request is [id number].

## 2024-01-18 NOTE — Telephone Encounter (Signed)
 This request is outside of my ability to complete-(This is a patient personal request and not a PA that was denied) can we please have provider type up a letter of necessity explaining why this patient must have 16 tablets per month, this must be presented by the provider since plan doesn't allow QTY exceptions. All needed info should be in chart as well as denial letter is under the media tab with information for appeal.Thank you!

## 2024-01-18 NOTE — Telephone Encounter (Signed)
 Morgan Duran from [employer] is f/u on [name]. Based on last entry she said this would be considered an Administrative Review, the call back would need to go to [phone]

## 2024-01-18 NOTE — Telephone Encounter (Signed)
 Would request completion of appeal. This medication can be repeated after 2 hours if needed. If this is the case, this quantity would only allow 5 migraines to be treated per month. Her migraines frequency can fluctuate and at times can have more than 5 migraines per month and at times can last up to 2 days. Thank you.

## 2024-01-19 ENCOUNTER — Telehealth: Payer: Self-pay | Admitting: Pharmacist

## 2024-01-19 NOTE — Telephone Encounter (Signed)
 Will forward to pharmacist for an appeals review.

## 2024-01-19 NOTE — Telephone Encounter (Signed)
 Appeal has been submitted for Ubrelvy , quantity of 16. Will advise when response is received, please be advised that most companies may take 30 days to make a decision. Appeal letter and supporting documentation have been faxed to [phone] on 01/19/2024 @9 :59 am.  Thank you, Devere Pandy, PharmD Clinical Pharmacist  [place]  Direct Dial : [phone]

## 2024-01-25 ENCOUNTER — Other Ambulatory Visit (HOSPITAL_COMMUNITY): Payer: Self-pay

## 2024-01-31 MED ORDER — EMGALITY 120 MG/ML ~~LOC~~ SOAJ: 120.0000 mg | SUBCUTANEOUS | 3 refills | Status: AC

## 2024-01-31 NOTE — Addendum Note (Signed)
 Addended by: WHITFIELD RAISIN L on: 01/31/2024 04:39 PM   Modules accepted: Orders

## 2024-02-01 ENCOUNTER — Other Ambulatory Visit: Payer: Self-pay

## 2024-02-01 DIAGNOSIS — F5101 Primary insomnia: Secondary | ICD-10-CM

## 2024-02-01 NOTE — Telephone Encounter (Signed)
 Last filled by patient on 01/30/24  Was sent in on 01/31/24 with 3 refills  Last office visit : 11/23/23 Next office visit : 06/13/24 -Will switch from Ajovy  to Emgality  - if you do not see any great benefit after your 3rd injection, please let me know  Too soon for refill request

## 2024-02-06 ENCOUNTER — Encounter: Payer: Self-pay | Admitting: *Deleted

## 2024-02-14 ENCOUNTER — Other Ambulatory Visit (HOSPITAL_COMMUNITY): Payer: Self-pay

## 2024-02-23 ENCOUNTER — Encounter: Payer: Self-pay | Admitting: Urgent Care

## 2024-02-23 ENCOUNTER — Ambulatory Visit (INDEPENDENT_AMBULATORY_CARE_PROVIDER_SITE_OTHER): Admitting: Urgent Care

## 2024-02-23 ENCOUNTER — Ambulatory Visit (INDEPENDENT_AMBULATORY_CARE_PROVIDER_SITE_OTHER)

## 2024-02-23 VITALS — BP 126/84 | HR 96 | Ht 64.25 in | Wt 272.0 lb

## 2024-02-23 DIAGNOSIS — E785 Hyperlipidemia, unspecified: Secondary | ICD-10-CM

## 2024-02-23 DIAGNOSIS — G8929 Other chronic pain: Secondary | ICD-10-CM

## 2024-02-23 DIAGNOSIS — F5101 Primary insomnia: Secondary | ICD-10-CM

## 2024-02-23 DIAGNOSIS — Z23 Encounter for immunization: Secondary | ICD-10-CM

## 2024-02-23 DIAGNOSIS — E1169 Type 2 diabetes mellitus with other specified complication: Secondary | ICD-10-CM | POA: Diagnosis not present

## 2024-02-23 DIAGNOSIS — E1165 Type 2 diabetes mellitus with hyperglycemia: Secondary | ICD-10-CM | POA: Diagnosis not present

## 2024-02-23 DIAGNOSIS — M25562 Pain in left knee: Secondary | ICD-10-CM

## 2024-02-23 DIAGNOSIS — G43709 Chronic migraine without aura, not intractable, without status migrainosus: Secondary | ICD-10-CM

## 2024-02-23 DIAGNOSIS — I350 Nonrheumatic aortic (valve) stenosis: Secondary | ICD-10-CM

## 2024-02-23 DIAGNOSIS — M1712 Unilateral primary osteoarthritis, left knee: Secondary | ICD-10-CM | POA: Diagnosis not present

## 2024-02-23 DIAGNOSIS — R110 Nausea: Secondary | ICD-10-CM

## 2024-02-23 MED ORDER — ONDANSETRON 8 MG PO TBDP: 8.0000 mg | ORAL_TABLET | Freq: Three times a day (TID) | ORAL | 11 refills | Status: AC | PRN

## 2024-02-23 MED ORDER — MOUNJARO 2.5 MG/0.5ML ~~LOC~~ SOAJ: 2.5000 mg | SUBCUTANEOUS | 0 refills | Status: AC

## 2024-02-23 MED ORDER — TRAZODONE HCL 50 MG PO TABS: 50.0000 mg | ORAL_TABLET | Freq: Every day | ORAL | 3 refills | Status: AC

## 2024-02-23 MED ORDER — TRESIBA FLEXTOUCH 200 UNIT/ML ~~LOC~~ SOPN: 22.0000 [IU] | PEN_INJECTOR | Freq: Every day | SUBCUTANEOUS | 1 refills | Status: AC

## 2024-02-23 MED ORDER — ATORVASTATIN CALCIUM 40 MG PO TABS: 40.0000 mg | ORAL_TABLET | Freq: Every day | ORAL | 3 refills | Status: AC

## 2024-02-23 MED ORDER — INSULIN LISPRO (1 UNIT DIAL) 100 UNIT/ML (KWIKPEN): PEN_INJECTOR | SUBCUTANEOUS | 2 refills | Status: DC

## 2024-02-23 NOTE — Progress Notes (Signed)
 Established Patient Office Visit  Subjective:  Patient ID: Morgan Duran, female    DOB: [birth year]  Age: 58 y.o. MRN: [id number]  Chief Complaint  Patient presents with   Diabetes    Needs new plan   Knee Pain    Clemens in April; pop/click    HPI  Discussed the use of AI scribe software for clinical note transcription with the patient, who gave verbal consent to proceed.  History of Present Illness   Morgan Duran is a 58 year old female with diabetes who presents for management of her blood sugar levels.  She has been experiencing challenges with her diabetes management, particularly with the use of Tresiba  insulin  and the Freestyle sensor. 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 The sensor readings often differ from her blood glucose meter, showing discrepancies of about forty points high or low. Despite using multiple sensors, she has not experienced hypoglycemia but has had hyperglycemic episodes.  She started using Tresiba  insulin  but discontinued it due to uncertainty about its use alongside metformin . Initially, she was on metformin  but stopped to manage her diabetes with insulin  alone. Her blood sugar reached as high as 450 mg/dL in August, prompting the switch to insulin . She has not been using Tresiba  recently due to uncertainty about its administration and dosage adjustments.  Her current medications include atorvastatin  for cholesterol, Emgality  for migraines, omega-3 fish oil, a multivitamin, tizanidine  for migraines, Ubrelvy  for migraines, and trazodone  for sleep. She also uses an over-the-counter sleep aid. She has previously used Ozempic  but faced issues with refills and insurance coverage.  She has a history of sleep apnea.  She has a history of a heart murmur, previously identified as aortic stenosis, and has had multiple echocardiograms over the years. 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   Additionally, she is requesting an xray of her knee after sustaining a fall several months ago. Has popping/ clicking in L knee s/p fall in April.      Patient Active Problem List   Diagnosis Date Noted   Primary insomnia 01/14/2023   Acute cough 12/08/2022   Nasal congestion 12/08/2022   Lightheadedness 12/01/2021   Encounter for preoperative assessment for noncoronary cardiac surgery 11/05/2021   Essential hypertension 11/05/2021   Benign cyst of left breast 04/09/2019   Statin declined 12/24/2018   History of osteoporosis 12/24/2018   Colon cancer screening 05/29/2018   Diabetic eye exam (HCC) 05/29/2018   Diabetic nephropathy associated with type 2 diabetes mellitus (HCC) 05/26/2018   Diabetic polyneuropathy associated with type 2 diabetes mellitus (HCC) 05/26/2018   Chronic migraine w/o aura w/o status migrainosus, not intractable 09/30/2017   Class 3 severe obesity due to excess calories with serious comorbidity and body mass index (BMI) of 40.0 to 44.9 in adult (HCC) 04/03/2017   Hypertension associated with diabetes (HCC) 04/03/2017   DJD (degenerative joint disease) 03/23/2017   Aortic stenosis, mild 03/23/2017   Primary localized osteoarthritis of right knee 03/22/2017   OSA on CPAP 01/15/2017   Systolic murmur 01/15/2017   Uncontrolled type 2 diabetes mellitus with hyperglycemia (HCC) 05/31/2016   Past Medical History:  Diagnosis Date   Anxiety    Arthritis    Benign cyst of left breast 04/09/2019   Diabetes mellitus without complication (HCC)    type 2   Heart  murmur    at birth, no problems   Hyperlipidemia    Hypertension    Migraine    Morbid obesity (HCC)    OSA on CPAP    Sleep apnea    uses cpap nightly   Past Surgical History:  Procedure Laterality Date   COLONOSCOPY  2003   [place]  - Normal   COLONOSCOPY  11/2019   NORMAL, Repeat q 5 yrs    KNEE ARTHROSCOPY Right    NASAL SEPTUM SURGERY     SHOULDER ARTHROSCOPY Left    TONSILLECTOMY     UPPER GASTROINTESTINAL ENDOSCOPY  2003   in [place]  - Normal   Social History   Tobacco Use   Smoking status: Never   Smokeless tobacco: Never  Vaping Use   Vaping status: Never Used  Substance Use Topics   Alcohol use: Not Currently   Drug use: Never      ROS: as noted in HPI  Objective:     BP 126/84   Pulse 96   Ht 5' 4.25 (1.632 m)   Wt 272 lb (123.4 kg)   SpO2 93%   BMI 46.33 kg/m  BP Readings from Last 3 Encounters:  02/23/24 126/84  11/23/23 120/61  11/16/23 128/64   Wt Readings from Last 3 Encounters:  02/23/24 272 lb (123.4 kg)  11/23/23 260 lb (117.9 kg)  11/16/23 252 lb (114.3 kg)      Physical Exam Vitals and nursing note reviewed. Exam conducted with a chaperone present.  Constitutional:      General: She is not in acute distress.    Appearance: Normal appearance. She is not ill-appearing, toxic-appearing or diaphoretic.  HENT:     Head: Normocephalic and atraumatic.     Right Ear: Tympanic membrane, ear canal and external ear normal. There is no impacted cerumen.     Left Ear: Tympanic membrane, ear canal and external ear normal. There is no impacted cerumen.     Nose: Nose normal.     Mouth/Throat:     Mouth: Mucous membranes are moist.     Pharynx: Oropharynx is clear. No oropharyngeal exudate or posterior oropharyngeal erythema.  Eyes:     General: No scleral icterus.       Right eye: No discharge.        Left eye: No discharge.     Extraocular Movements: Extraocular movements intact.     Pupils: Pupils are equal, round, and reactive to light.  Neck:     Thyroid : No thyroid  mass, thyromegaly or thyroid  tenderness.  Cardiovascular:     Rate and Rhythm: Normal rate and regular rhythm.     Pulses: Normal pulses.     Heart sounds: Murmur heard.  Pulmonary:     Effort: Pulmonary effort is normal. No respiratory distress.     Breath  sounds: Normal breath sounds. No stridor. No wheezing or rhonchi.  Musculoskeletal:     Cervical back: Normal range of motion and neck supple. No rigidity or tenderness.     Right lower leg: No edema.     Left lower leg: No edema.     Right foot: Normal range of motion.     Left foot: Normal range of motion.  Feet:     Right foot:     Protective Sensation: 10 sites tested.  10 sites sensed.     Skin integrity: Skin integrity normal. No ulcer, blister, skin breakdown, erythema or warmth.     Left foot:  Protective Sensation: 10 sites tested.  10 sites sensed.     Skin integrity: Skin integrity normal. No ulcer, blister, skin breakdown, erythema or warmth.  Lymphadenopathy:     Cervical: No cervical adenopathy.  Skin:    General: Skin is warm and dry.     Coloration: Skin is not jaundiced.     Findings: No bruising, erythema or rash.  Neurological:     General: No focal deficit present.     Mental Status: She is alert and oriented to person, place, and time.  Psychiatric:        Mood and Affect: Mood normal.        Behavior: Behavior normal.        Last CBC Lab Results  Component Value Date   WBC 7.4 11/16/2023   HGB 13.7 11/16/2023   HCT 43.4 11/16/2023   MCV 94 11/16/2023   MCH 29.6 11/16/2023   RDW 12.2 11/16/2023   PLT 331 11/16/2023    Last lipids Lab Results  Component Value Date   CHOL 152 11/16/2023   HDL 55 11/16/2023   LDLCALC 70 11/16/2023   TRIG 160 (H) 11/16/2023   CHOLHDL 2.8 11/16/2023   Last thyroid  functions Lab Results  Component Value Date   TSH 1.790 11/16/2023   FREET4 1.2 05/31/2016   Last vitamin D No results found for: 25OHVITD2, 25OHVITD3, VD25OH Last vitamin B12 and Folate No results found for: VITAMINB12, FOLATE    The 10-year ASCVD risk score (Arnett DK, et al., 2019) is: 3.5%  Assessment & Plan:  Hyperlipidemia associated with type 2 diabetes mellitus (HCC) -     Atorvastatin  Calcium ; Take 1 tablet (40 mg  total) by mouth daily.  Dispense: 90 tablet; Refill: 3  Uncontrolled type 2 diabetes mellitus with hyperglycemia (HCC) -     Tresiba  FlexTouch; Inject 22 Units into the skin daily. Total daily dose = 0.2-0.5 units/kg/day---> 50% Basal/50% Bolus  Dispense: 9.9 mL; Refill: 1 -     Mounjaro ; Inject 2.5 mg into the skin once a week.  Dispense: 6.5 mL; Refill: 0 -     Microalbumin / creatinine urine ratio -     Hemoglobin A1c -     CMP14+EGFR -     HM Diabetes Foot Exam -     Insulin  Lispro (1 Unit Dial ); Inject per sliding scale directions three times daily not to exceed 30 units daily  Dispense: 3 mL; Refill: 2  Nausea -     Ondansetron ; Take 1 tablet (8 mg total) by mouth every 8 (eight) hours as needed for nausea or vomiting.  Dispense: 20 tablet; Refill: 11  Chronic migraine w/o aura w/o status migrainosus, not intractable -     Ondansetron ; Take 1 tablet (8 mg total) by mouth every 8 (eight) hours as needed for nausea or vomiting.  Dispense: 20 tablet; Refill: 11  Primary insomnia -     traZODone  HCl; Take 1-2 tablets (50-100 mg total) by mouth at bedtime.  Dispense: 180 tablet; Refill: 3  Encounter for immunization -     Pneumococcal conjugate vaccine 20-valent -     Flu vaccine trivalent PF, 6mos and older(Flulaval,Afluria,Fluarix,Fluzone)  Aortic stenosis, mild  Chronic pain of left knee -     DG Knee Complete 4 Views Left; Future   Assessment and Plan    Type 2 diabetes mellitus, poorly controlled Poorly controlled with A1c of 11.6% previously, will update today. Discontinued Tresiba  and Freestyle sensor due to management issues. Metformin  not  continued. Discussed insulin  therapy importance, A1c reduction risks, Tresiba  titration, Lispro for meal spikes, and Mounjaro  benefits over Ozempic . - Restart Tresiba  at 22 units daily, titrate every 5 days based on fasting glucose. - Use Lispro with meals per sliding scale. - Prescribed Mounjaro , starting at 2.5 mg, titrate to 5 mg,  then 7.5 mg. - Rechecked A1c. - Performed CMP and diabetic kidney evaluation. - foot exam completed today - Administered pneumonia and flu vaccines.  Chronic migraine Managed with Emgality , tizanidine , Ubrelvy , and Zofran  for nausea. - Continue Emgality , tizanidine , and Ubrelvy . - Prescribed Zofran  for nausea. - consider Qulipta to decrease pill burden  Primary insomnia Managed effectively with trazodone  and OTC sleep aid. - Prescribed trazodone  with a 90-day supply, up to 190 tablets.  Obstructive sleep apnea Discussed Mounjaro  benefits for weight loss and sleep apnea. - Prescribed Mounjaro  for weight loss and sleep apnea management.  Hyperlipidemia Well-controlled with atorvastatin . - Continue atorvastatin  with a 90-day supply.  Nausea, recurrent Recurrent nausea possibly related to Ozempic  and migraines. Managed with Zofran . - Prescribed Zofran  for nausea management.  L knee pain Obtain xray imaging for now.  General Health Maintenance Due for vaccines and screenings. Colonoscopy due August 2026. Pap smear due. - Administered pneumonia and flu vaccines. - Scheduled Pap smear in 6-8 weeks. - Plan for colonoscopy in August 2026.      I spent 40 minutes of total time managing this patient today, this includes chart review, face to face, and non-face to face time, reviewing outside records and labs and providing personal interpretation.   Return in about 6 weeks (around 04/05/2024).   Benton LITTIE Gave, PA

## 2024-02-23 NOTE — Patient Instructions (Addendum)
 Please start using Tresiba  nightly. 22 units nightly. Continue to monitor your fasting glucose levels. Our goal (eventually) is 120 in the morning. Increase by 2 units every 5 days until your fasting glucose reaches goal.  For example - Tonight through 11/25 - take 22 units of Tresiba . 11/26 - 11/30 - take 24 units of tresiba  12/1-12/5 - take 26 units of tresiba  Continue to increase by 2 units every 5 days until your fasting glucose is 120.  Use the novolog per sliding scale. Take with meals per sliding scale below:   Start mounjaro - start with 2.5mg  once weekly. We will increase every 4 weeks until goal or intolerance.  Continue with trazodone  and lipitor.  Please follow up in 6 weeks for recheck and pap smear

## 2024-02-24 ENCOUNTER — Other Ambulatory Visit (HOSPITAL_COMMUNITY): Payer: Self-pay

## 2024-02-24 ENCOUNTER — Ambulatory Visit: Payer: Self-pay | Admitting: Urgent Care

## 2024-02-24 DIAGNOSIS — E1165 Type 2 diabetes mellitus with hyperglycemia: Secondary | ICD-10-CM

## 2024-02-24 DIAGNOSIS — R809 Proteinuria, unspecified: Secondary | ICD-10-CM

## 2024-02-24 LAB — CMP14+EGFR
ALT: 21 IU/L (ref 0–32)
AST: 19 IU/L (ref 0–40)
Albumin: 4.5 g/dL (ref 3.8–4.9)
Alkaline Phosphatase: 102 IU/L (ref 49–135)
BUN/Creatinine Ratio: 32 — ABNORMAL HIGH (ref 9–23)
BUN: 21 mg/dL (ref 6–24)
Bilirubin Total: 0.4 mg/dL (ref 0.0–1.2)
CO2: 23 mmol/L (ref 20–29)
Calcium: 10 mg/dL (ref 8.7–10.2)
Chloride: 95 mmol/L — ABNORMAL LOW (ref 96–106)
Creatinine, Ser: 0.66 mg/dL (ref 0.57–1.00)
Globulin, Total: 2.6 g/dL (ref 1.5–4.5)
Glucose: 214 mg/dL — ABNORMAL HIGH (ref 70–99)
Potassium: 4.4 mmol/L (ref 3.5–5.2)
Sodium: 134 mmol/L (ref 134–144)
Total Protein: 7.1 g/dL (ref 6.0–8.5)
eGFR: 102 mL/min/1.73 (ref >59)

## 2024-02-24 LAB — HEMOGLOBIN A1C
Est. average glucose Bld gHb Est-mCnc: 260 mg/dL
Hgb A1c MFr Bld: 10.7 % — ABNORMAL HIGH (ref 4.8–5.6)

## 2024-02-24 LAB — MICROALBUMIN / CREATININE URINE RATIO
Creatinine, Urine: 102.7 mg/dL
Microalb/Creat Ratio: 70 mg/g{creat} — ABNORMAL HIGH (ref 0–29)
Microalbumin, Urine: 72.1 ug/mL

## 2024-02-24 MED ORDER — EMPAGLIFLOZIN 10 MG PO TABS: 10.0000 mg | ORAL_TABLET | Freq: Every day | ORAL | 1 refills | Status: AC

## 2024-02-24 NOTE — Telephone Encounter (Signed)
 Insurance denied the request for a higher quantity, only 10/month allowed.

## 2024-03-19 ENCOUNTER — Ambulatory Visit: Admitting: Urgent Care

## 2024-04-02 ENCOUNTER — Ambulatory Visit: Payer: Self-pay | Admitting: Internal Medicine

## 2024-04-02 NOTE — Telephone Encounter (Signed)
 FYI Only or Action Required?: Action required by provider: request for appointment, clinical question for provider, and update on patient condition.  Patient is followed in Pulmonology for OSA, last seen on 02/25/20.  Called Nurse Triage reporting Breathing Problem.  Symptoms began several days ago.  Interventions attempted: Nothing.  Symptoms are: unchanged.  Triage Disposition: See Physician Within 24 Hours  Patient/caregiver understands and will follow disposition?: Yes    Copied from CRM #[id number]. Topic: Clinical - Red Word Triage >> Apr 02, 2024  4:43 PM Rilla B wrote: [facility] that prompted transfer to Nurse Triage: Coughing, Heavy chest breathing     Reason for Disposition  [1] Continuous (nonstop) coughing interferes with work or school AND [2] no improvement using cough treatment per Care Advice  Answer Assessment - Initial Assessment Questions Pt called in requesting appt this week with Dr. Neysa for non productive cough x 2 days and to get new supplies for Cpap. Discussed Dr. Neysa is leaving and pt would need to transfer care; pt has not been seen in 3 years so would be new patient. Discussed no appts until 04/16/24 so request sent to office for appt. Pt voiced understanding. Discussed symptom management of Robitussin, delsym, cough drops, warm fluids and continuing using humidifier. Pt voiced appreciation and understanding.     1. ONSET: When did the cough begin?      Saturday, 12/27   2. SEVERITY: How bad is the cough today?      Moderate   3. SPUTUM: Describe the color of your sputum (e.g., none, dry cough; clear, white, yellow, green)     Unable to clear; reports chest feels heavy d/t congestion   4. HEMOPTYSIS: Are you coughing up any blood? If Yes, ask: How much? (e.g., flecks, streaks, tablespoons, etc.)     No   5. DIFFICULTY BREATHING: Are you having difficulty breathing? If Yes, ask: How bad is it? (e.g., mild, moderate, severe)       Yes; SOB with coughing. Pt reports wearing a cpap with humidified coil to aid with breathing HS  6. FEVER: Do you have a fever? If Yes, ask: What is your temperature, how was it measured, and when did it start?     No   10. OTHER SYMPTOMS: Do you have any other symptoms? (e.g., runny nose, wheezing, chest pain)       Mild wheezing at times  Protocols used: Cough - Acute Non-Productive-A-AH

## 2024-04-03 ENCOUNTER — Ambulatory Visit
Admission: EM | Admit: 2024-04-03 | Discharge: 2024-04-03 | Disposition: A | Discharge status: Home / Self Care | Attending: Family Medicine | Admitting: Family Medicine

## 2024-04-03 ENCOUNTER — Ambulatory Visit (INDEPENDENT_AMBULATORY_CARE_PROVIDER_SITE_OTHER)

## 2024-04-03 ENCOUNTER — Other Ambulatory Visit: Payer: Self-pay

## 2024-04-03 DIAGNOSIS — R059 Cough, unspecified: Secondary | ICD-10-CM

## 2024-04-03 DIAGNOSIS — J069 Acute upper respiratory infection, unspecified: Secondary | ICD-10-CM | POA: Diagnosis not present

## 2024-04-03 DIAGNOSIS — R5383 Other fatigue: Secondary | ICD-10-CM

## 2024-04-03 LAB — POCT INFLUENZA A/B
Influenza A, POC: NEGATIVE
Influenza B, POC: NEGATIVE

## 2024-04-03 LAB — POC SOFIA SARS ANTIGEN FIA: SARS Coronavirus 2 Ag: NEGATIVE

## 2024-04-03 MED ORDER — BENZONATATE 200 MG PO CAPS: 200.0000 mg | ORAL_CAPSULE | Freq: Three times a day (TID) | ORAL | 0 refills | Status: AC | PRN

## 2024-04-03 MED ORDER — AMOXICILLIN-POT CLAVULANATE 875-125 MG PO TABS: 1.0000 | ORAL_TABLET | Freq: Two times a day (BID) | ORAL | 0 refills | Status: AC

## 2024-04-03 NOTE — ED Triage Notes (Signed)
 Pt presenting  with  c/o chest congestion, non productive cough, headache and fatigue x 3 days. Pt stated that she has been taking Tylenol , last taken yesterday which was ineffective.

## 2024-04-03 NOTE — ED Provider Notes (Signed)
 " [facility]    CSN: [id number] Arrival date & time: 04/03/24  1153      History   Chief Complaint Chief Complaint  Patient presents with   Cough    HPI Mieke Brinley is a 58 y.o. female.   HPI pleasant 58 year old female presents with cough, PMH significant for severe class III obesity, diabetic polyneuropathy associated with type 2 diabetes mellitus, and acute cough.  Past Medical History:  Diagnosis Date   Anxiety    Arthritis    Benign cyst of left breast 04/09/2019   Diabetes mellitus without complication (HCC)    type 2   Heart murmur    at birth, no problems   Hyperlipidemia    Hypertension    Migraine    Morbid obesity (HCC)    OSA on CPAP    Sleep apnea    uses cpap nightly    Patient Active Problem List   Diagnosis Date Noted   Primary insomnia 01/14/2023   Acute cough 12/08/2022   Nasal congestion 12/08/2022   Lightheadedness 12/01/2021   Encounter for preoperative assessment for noncoronary cardiac surgery 11/05/2021   Essential hypertension 11/05/2021   Benign cyst of left breast 04/09/2019   Statin declined 12/24/2018   History of osteoporosis 12/24/2018   Colon cancer screening 05/29/2018   Diabetic eye exam (HCC) 05/29/2018   Diabetic nephropathy associated with type 2 diabetes mellitus (HCC) 05/26/2018   Diabetic polyneuropathy associated with type 2 diabetes mellitus (HCC) 05/26/2018   Chronic migraine w/o aura w/o status migrainosus, not intractable 09/30/2017   Class 3 severe obesity due to excess calories with serious comorbidity and body mass index (BMI) of 40.0 to 44.9 in adult (HCC) 04/03/2017   Hypertension associated with diabetes (HCC) 04/03/2017   DJD (degenerative joint disease) 03/23/2017   Aortic stenosis, mild 03/23/2017   Primary localized osteoarthritis of right knee 03/22/2017   OSA on CPAP 01/15/2017   Systolic murmur 01/15/2017   Uncontrolled type 2 diabetes mellitus with hyperglycemia (HCC) 05/31/2016     Past Surgical History:  Procedure Laterality Date   COLONOSCOPY  2003   [place]  - Normal   COLONOSCOPY  11/2019   NORMAL, Repeat q 5 yrs   KNEE ARTHROSCOPY Right    NASAL SEPTUM SURGERY     SHOULDER ARTHROSCOPY Left    TONSILLECTOMY     UPPER GASTROINTESTINAL ENDOSCOPY  2003   in [place]  - Normal    OB History     Gravida  2   Para  2   Term      Preterm      AB      Living         SAB      IAB      Ectopic      Multiple      Live Births               Home Medications    Prior to Admission medications  Medication Sig Start Date End Date Taking? Authorizing Provider  amoxicillin -clavulanate (AUGMENTIN ) 875-125 MG tablet Take 1 tablet by mouth 2 (two) times daily for 10 days. 04/03/24 04/13/24 Yes Teddy Sharper, FNP  benzonatate  (TESSALON ) 200 MG capsule Take 1 capsule (200 mg total) by mouth 3 (three) times daily as needed for up to 7 days. 04/03/24 04/10/24 Yes Teddy Sharper, FNP  atorvastatin  (LIPITOR) 40 MG tablet Take 1 tablet (40 mg total) by mouth daily. 02/23/24   Crain, Whitney L, PA  Continuous  Glucose Sensor (FREESTYLE LIBRE 3 PLUS SENSOR) MISC Change sensor every 15 days. 11/16/23   Crain, Whitney L, PA  empagliflozin  (JARDIANCE ) 10 MG TABS tablet Take 1 tablet (10 mg total) by mouth daily. 02/24/24   Crain, Whitney L, PA  Galcanezumab -gnlm (EMGALITY ) 120 MG/ML SOAJ Inject 120 mg into the skin every 30 (thirty) days. 01/31/24   Whitfield Raisin, NP  glucose blood (ONE TOUCH ULTRA TEST) test strip USE AS INSTRUCTED 11/05/21   Bevin Bernice RAMAN, DO  insulin  degludec (TRESIBA  FLEXTOUCH) 200 UNIT/ML FlexTouch Pen Inject 22 Units into the skin daily. Total daily dose = 0.2-0.5 units/kg/day---> 50% Basal/50% Bolus 02/23/24   Crain, Tomas de Castro L, PA  insulin  lispro (HUMALOG  KWIKPEN) 100 UNIT/ML KwikPen Inject per sliding scale directions three times daily not to exceed 30 units daily 02/23/24   Crain, Whitney L, PA  Multiple Vitamins-Minerals (MULTIVITAMIN WOMEN PO)  Take by mouth.    [provider]  Omega-3 Fatty Acids (FISH OIL PO) Take by mouth daily.    [provider]  ondansetron  (ZOFRAN -ODT) 8 MG disintegrating tablet Take 1 tablet (8 mg total) by mouth every 8 (eight) hours as needed for nausea or vomiting. 02/23/24   Crain, Whitney L, PA  tirzepatide  (MOUNJARO ) 2.5 MG/0.5ML Pen Inject 2.5 mg into the skin once a week. 02/23/24   Crain, Whitney L, PA  tiZANidine  (ZANAFLEX ) 4 MG tablet Take 1 tablet (4 mg total) by mouth every 6 (six) hours as needed for muscle spasms. 11/19/22   Onita Duos, MD  traZODone  (DESYREL ) 50 MG tablet Take 1-2 tablets (50-100 mg total) by mouth at bedtime. 02/23/24   Crain, Whitney L, PA  Ubrogepant  (UBRELVY ) 100 MG TABS Take 1 tablet (100 mg total) by mouth as needed. At onset of migraine headache, can repeat after 2 hours if needed 12/20/23   Whitfield Raisin, NP    Family History Family History  Problem Relation Age of Onset   Emphysema Mother    Diabetes Father    Colon polyps Neg Hx    Rectal cancer Neg Hx    Stomach cancer Neg Hx     Social History Social History[1]   Allergies   Guaifenesin -codeine    Review of Systems Review of Systems   Physical Exam Triage Vital Signs ED Triage Vitals  Encounter Vitals Group     BP 04/03/24 1323 114/75     Girls Systolic BP Percentile --      Girls Diastolic BP Percentile --      Boys Systolic BP Percentile --      Boys Diastolic BP Percentile --      Pulse Rate 04/03/24 1323 85     Resp 04/03/24 1323 18     Temp 04/03/24 1323 98.2 F (36.8 C)     Temp Source 04/03/24 1323 Oral     SpO2 04/03/24 1323 99 %     Weight 04/03/24 1326 255 lb (115.7 kg)     Height 04/03/24 1326 5' 4 (1.626 m)     Head Circumference --      Peak Flow --      Pain Score 04/03/24 1325 10     Pain Loc --      Pain Education --      Exclude from Growth Chart --    No data found.  Updated Vital Signs BP 114/75 (BP Location: Right Arm)   Pulse 85   Temp 98.2  F (36.8 C) (Oral)   Resp 18   Ht 5'  4 (1.626 m)   Wt 255 lb (115.7 kg)   SpO2 99%   BMI 43.77 kg/m   Visual Acuity Right Eye Distance:   Left Eye Distance:   Bilateral Distance:    Right Eye Near:   Left Eye Near:    Bilateral Near:     Physical Exam Vitals and nursing note reviewed.  Constitutional:      Appearance: Normal appearance. She is obese.  HENT:     Head: Normocephalic and atraumatic.     Right Ear: Tympanic membrane, ear canal and external ear normal.     Left Ear: Tympanic membrane, ear canal and external ear normal.     Mouth/Throat:     Mouth: Mucous membranes are moist.     Pharynx: Oropharynx is clear.  Eyes:     Extraocular Movements: Extraocular movements intact.     Conjunctiva/sclera: Conjunctivae normal.     Pupils: Pupils are equal, round, and reactive to light.  Cardiovascular:     Rate and Rhythm: Normal rate and regular rhythm.     Pulses: Normal pulses.     Heart sounds: Normal heart sounds.  Pulmonary:     Effort: Pulmonary effort is normal.     Breath sounds: Normal breath sounds. No wheezing, rhonchi or rales.  Musculoskeletal:        General: Normal range of motion.  Skin:    General: Skin is warm and dry.  Neurological:     General: No focal deficit present.     Mental Status: She is alert and oriented to person, place, and time. Mental status is at baseline.  Psychiatric:        Mood and Affect: Mood normal.        Behavior: Behavior normal.      UC Treatments / Results  Labs (all labs ordered are listed, but only abnormal results are displayed) Labs Reviewed  POC SOFIA SARS ANTIGEN FIA  POCT INFLUENZA A/B    EKG   Radiology DG Chest 2 View Result Date: 04/03/2024 EXAM: 2 VIEW(S) XRAY OF THE CHEST 04/03/2024 02:03:57 PM COMPARISON: 12/12/2022. CLINICAL HISTORY: Cough x 4 days, fatigue, with diminished breath sounds on exam. FINDINGS: LUNGS AND PLEURA: No focal pulmonary opacity. No pleural effusion. No  pneumothorax. HEART AND MEDIASTINUM: No acute abnormality of the cardiac and mediastinal silhouettes. BONES AND SOFT TISSUES: No acute osseous abnormality. IMPRESSION: 1. No active cardiopulmonary disease. Electronically signed by: Franky Crease MD 04/03/2024 03:06 PM EST RP Workstation: [id number]    Procedures Procedures (including critical care time)  Medications Ordered in UC Medications - No data to display  Initial Impression / Assessment and Plan / UC Course  I have reviewed the triage vital signs and the nursing notes.  Pertinent labs & imaging results that were available during my care of the patient were reviewed by me and considered in my medical decision making (see chart for details).     MDM: 1.  Acute URI-Rx'd Augmentin  875/125 mg tablet: Take 1 tablet twice daily x 10 days; 2.  Cough, unspecified type-CXR results revealed above Rx'd Tessalon  200 mg capsules: Take 1 capsule 3 times daily, as needed for cough. Advised patient take medications as directed with food to completion.  Advised may use Tessalon  capsules daily or as needed for cough.  Encouraged increase daily water intake to 64 ounces per day while taking these medications.  Advised if symptoms worsen and are unresolved please follow-up with the PCP or here for further evaluation.  Final Clinical Impressions(s) / UC Diagnoses   Final diagnoses:  Cough, unspecified type  Acute URI     Discharge Instructions      Advised patient take medications as directed with food to completion.  Advised may use Tessalon  capsules daily or as needed for cough.  Encouraged increase daily water intake to 64 ounces per day while taking these medications.  Advised if symptoms worsen and are unresolved please follow-up with the PCP or here for further evaluation.     ED Prescriptions     Medication Sig Dispense Auth. Provider   amoxicillin -clavulanate (AUGMENTIN ) 875-125 MG tablet Take 1 tablet by mouth 2 (two) times daily for 10  days. 20 tablet Genna Casimir, FNP   benzonatate  (TESSALON ) 200 MG capsule Take 1 capsule (200 mg total) by mouth 3 (three) times daily as needed for up to 7 days. 40 capsule Mauricia Mertens, FNP      PDMP not reviewed this encounter.     [1]  Social History Tobacco Use   Smoking status: Never   Smokeless tobacco: Never  Vaping Use   Vaping status: Never Used  Substance Use Topics   Alcohol use: Not Currently   Drug use: Never     Teddy Sharper, FNP 04/03/24 2008  "

## 2024-04-03 NOTE — Discharge Instructions (Addendum)
 Advised patient take medications as directed with food to completion.  Advised may use Tessalon  capsules daily or as needed for cough.  Encouraged increase daily water intake to 64 ounces per day while taking these medications.  Advised if symptoms worsen and are unresolved please follow-up with the PCP or here for further evaluation.

## 2024-04-03 NOTE — Telephone Encounter (Signed)
 Called and spoke with the pt and advised where she hasn't been seen in 3 years she will need a consult appt and a new provider. Pt has been scheduled 1/5 with Dr. Theodoro and I advised pt to contact PCP or go to UC  to be evaluated for coughing and heavy chest breathing.  Pt verbalized understanding and is going to contact PCP.  Nothing further needed.

## 2024-04-04 ENCOUNTER — Ambulatory Visit: Admitting: Family Medicine

## 2024-04-09 ENCOUNTER — Ambulatory Visit

## 2024-04-09 VITALS — BP 130/78 | HR 94 | Temp 97.4°F | Ht 65.0 in | Wt 278.2 lb

## 2024-04-09 DIAGNOSIS — J4520 Mild intermittent asthma, uncomplicated: Secondary | ICD-10-CM

## 2024-04-09 DIAGNOSIS — J45909 Unspecified asthma, uncomplicated: Secondary | ICD-10-CM

## 2024-04-09 DIAGNOSIS — G4733 Obstructive sleep apnea (adult) (pediatric): Secondary | ICD-10-CM | POA: Diagnosis not present

## 2024-04-09 MED ORDER — BUDESONIDE-FORMOTEROL FUMARATE 160-4.5 MCG/ACT IN AERO: 2.0000 | INHALATION_SPRAY | Freq: Two times a day (BID) | RESPIRATORY_TRACT | 6 refills | Status: AC

## 2024-04-09 MED ORDER — ALBUTEROL SULFATE HFA 108 (90 BASE) MCG/ACT IN AERS: 2.0000 | INHALATION_SPRAY | Freq: Four times a day (QID) | RESPIRATORY_TRACT | 6 refills | Status: AC | PRN

## 2024-04-09 MED ORDER — GUAIFENESIN 100 MG/5ML PO LIQD: 5.0000 mL | ORAL | 0 refills | Status: AC | PRN

## 2024-04-09 NOTE — Patient Instructions (Signed)
" °  VISIT SUMMARY: Today, you were seen for a cough that has been ongoing for about a week and a half. You have a history of mild sleep apnea and intermittent asthma, which tends to worsen when you are ill. We discussed your current symptoms and adjusted your treatment plan accordingly.  YOUR PLAN: -REACTIVE AIRWAY DISEASE WITH INTERMITTENT ASTHMA: Intermittent asthma is a condition where your airways temporarily narrow, making it hard to breathe, especially when you are sick. To manage this, you have been prescribed a Symbicort  inhaler to use two puffs twice daily and an albuterol  inhaler to use as needed for shortness of breath. Remember to rinse your mouth after using Symbicort  to prevent any infections. If your symptoms get worse or if you think you might need prednisone , please contact our office. We will see you again in one month to check on your progress.  -OBSTRUCTIVE SLEEP APNEA: Obstructive sleep apnea is a condition where your airway becomes blocked during sleep, causing breathing pauses. Your condition is well-controlled with your CPAP machine, which helps you sleep better and feel more refreshed. Continue using your CPAP machine and make sure to clean it regularly with mild soap and purified water.  INSTRUCTIONS: Please follow up in one month for a re-evaluation of your asthma and to ensure your symptoms are improving. If your symptoms worsen or you feel you need prednisone , contact our office immediately.                      Contains text generated by [employer].                                 Contains text generated by [employer].   "

## 2024-04-09 NOTE — Progress Notes (Signed)
 "  Pulmonology Office Visit   Subjective:  Patient ID: Morgan Duran, female    DOB: November 19, 1965  MRN: [id number]  Referred by: Lowella Benton CROME, PA  CC:  Chief Complaint  Patient presents with   Sleep Apnea    CPAP supplies scheduled delivery for today    HPI Morgan Duran is a 59 y.o. female non-smoker with DM2, insomnia, hypertension, morbid obesity with BMI 44, OSA on CPAP, anxiety, tonsillectomy and septoplasty presents for evaluation for cough as well as OSA management.  Respective notes from provider reviewed as appropriate to gather relevant information for patient care.   Discussed the use of AI scribe software for clinical note transcription with the patient, who gave verbal consent to proceed.  History of Present Illness   Morgan Duran is a 59 year old female who presents with a cough.  She has been experiencing a cough for approximately a week and a half, which began on a Saturday. The cough is accompanied by occasional production of green and yellow phlegm, though not in large amounts. It occurs in spurts and sometimes causes difficulty in breathing. She is currently taking Augmentin  and Tessalon  for the cough.  She has a history of mild sleep apnea diagnosed in 2007, for which she uses a CPAP machine. The CPAP improves her sleep quality, allowing her to wake up refreshed. She maintains a consistent sleep schedule to prevent migraines and reports no significant daytime sleepiness while at work. She regularly cleans her CPAP machine using wipes, spray, and a brush, and uses purified water in the machine.  She experiences asthma-like symptoms only when sick. She recalls being diagnosed with asthma at an urgent care and has used inhalers in the past during episodes of bronchitis. Her sister has chronic asthma. She has previously used prednisone , but notes that her blood sugar levels increase significantly when she is sick or in pain. She has a history of pneumonia diagnosed last  year.  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 She does not smoke or drink alcohol. She has a daughter who is 1 years old.      OSA history: On trazodone  through primary care. Diagnosed in 2007.  Has been on CPAP.  OSA symptoms: Snoring, EDS.  ASTHMA:  First diagnosed: not officially diagnosed.  FH: sister.  Triggers: viral disease.  Intubated: no Last steroid use: during covid 2021.  Times albuterol  used: none.  Others: pregnancy- yes, allergy symptoms- no, throat choking/stridor- no, GERD- no, OSA- yes  PAP download compliance data: [employer] Encore/Airview AirSense 11. Pressure: 4-15.  Median 7, max 11.6. Hours of usage: 8-hour 24-minute. Days used >4hr: 30/30 Leak: Minimal. AHI: 1.4.  PRIOR TESTS and IMAGING: PSG/HSAT:  HST December 2021: AHI 4/h. Split-night study 08/22/2020: AHI 8.1, supine dominant, O2 nadir 86%.  ECHO: Echo EF 60-65%  Chest x-ray 04/03/2024: Clear.      04/09/2024   10:00 AM 02/25/2020    1:00 PM  Results of the Epworth flowsheet  Sitting and reading 0 1  Watching TV 0 2  Sitting, inactive in a public place (e.g. a theatre or a meeting) 0 0  As a passenger in a car for an hour without a break 0 1  Lying down to rest in the afternoon when circumstances permit 3 3  Sitting and talking to someone 0 0  Sitting quietly after a lunch without alcohol 0 0  In a car, while stopped for  a few minutes in traffic 0 0  Total score 3 7    Allergies: Guaifenesin -codeine  Current Medications[1] Past Medical History:  Diagnosis Date   Anxiety    Arthritis    Benign cyst of left breast 04/09/2019   Diabetes mellitus without complication (HCC)    type 2   Heart murmur    at birth, no problems   Hyperlipidemia    Hypertension    Migraine    Morbid obesity (HCC)    OSA on CPAP    Sleep apnea    uses cpap nightly   Past Surgical History:  Procedure Laterality Date   COLONOSCOPY   2003   [place]  - Normal   COLONOSCOPY  11/2019   NORMAL, Repeat q 5 yrs   KNEE ARTHROSCOPY Right    NASAL SEPTUM SURGERY     SHOULDER ARTHROSCOPY Left    TONSILLECTOMY     UPPER GASTROINTESTINAL ENDOSCOPY  2003   in [place]  - Normal   Family History  Problem Relation Age of Onset   Emphysema Mother    Diabetes Father    Colon polyps Neg Hx    Rectal cancer Neg Hx    Stomach cancer Neg Hx    Social History   Socioeconomic History   Marital status: Married    Spouse name: Not on file   Number of children: Not on file   Years of education: Not on file   Highest education level: Associate degree: occupational, scientist, product/process development, or vocational program  Occupational History   Not on file  Tobacco Use   Smoking status: Never   Smokeless tobacco: Never  Vaping Use   Vaping status: Never Used  Substance and Sexual Activity   Alcohol use: Not Currently   Drug use: Never   Sexual activity: Not Currently    Birth control/protection: Abstinence  Other Topics Concern   Not on file  Social History Narrative   Not on file   Social Drivers of Health   Tobacco Use: Low Risk (04/09/2024)   Patient History    Smoking Tobacco Use: Never    Smokeless Tobacco Use: Never    Passive Exposure: Not on file  Financial Resource Strain: Low Risk (07/06/2022)   Overall Financial Resource Strain (CARDIA)    Difficulty of Paying Living Expenses: Not hard at all  Food Insecurity: No Food Insecurity (07/06/2022)   Hunger Vital Sign    Worried About Running Out of Food in the Last Year: Never true    Ran Out of Food in the Last Year: Never true  Transportation Needs: No Transportation Needs (07/06/2022)   PRAPARE - Administrator, Civil Service (Medical): No    Lack of Transportation (Non-Medical): No  Physical Activity: Unknown (07/06/2022)   Exercise Vital Sign    Days of Exercise per Week: 0 days    Minutes of Exercise per Session: Not on file  Stress: No Stress Concern Present (07/06/2022)    [employer] of Occupational Health - Occupational Stress Questionnaire    Feeling of Stress : Not at all  Social Connections: Moderately Integrated (07/06/2022)   Social Connection and Isolation Panel    Frequency of Communication with Friends and Family: More than three times a week    Frequency of Social Gatherings with Friends and Family: Never    Attends Religious Services: 1 to 4 times per year    Active Member of [employer] or Organizations: No    Attends Banker Meetings: Not on file  Marital Status: Married  Catering Manager Violence: Low Risk  (03/23/2022)   Received from [facility] visits prior to 06/05/2022.   Safety    How often does anyone, including family and friends, physically hurt you?: Never    How often does anyone, including family and friends, insult or talk down to you?: Never    How often does anyone, including family and friends, threaten you with harm?: Never    How often does anyone, including family and friends, scream or curse at you?: Never  Depression (PHQ2-9): Low Risk (11/16/2023)   Depression (PHQ2-9)    PHQ-2 Score: 0  Alcohol Screen: Not on file  Housing: Low Risk (07/06/2022)   Housing    Last Housing Risk Score: 0  Utilities: Low Risk (03/23/2022)   Received from [facility]   Utilities    In the past 12 months has the electric, gas, oil, or water company threatened to shut off services in your home? : No  Health Literacy: Not on file       Objective:  BP 130/78   Pulse 94   Temp (!) 97.4 F (36.3 C) (Temporal)   Ht 5' 5 (1.651 m)   Wt 278 lb 3.2 oz (126.2 kg)   SpO2 94% Comment: room air  BMI 46.29 kg/m  BMI Readings from Last 3 Encounters:  04/09/24 46.29 kg/m  04/03/24 43.77 kg/m  02/23/24 46.33 kg/m    Physical Exam: Physical Exam   VITALS: SaO2- 94% ENT: Normal mucosa.  PULMONARY: Lungs clear to auscultation bilaterally, no adventitious breath sounds. Delayed expiratory breath  sounds. CARDIOVASCULAR: Regular rate and rhythm, S1 S2 normal, no murmurs. ABDOMEN: Abdomen soft, nontender. Bowel sounds are normal. EXTREMITIES: No peripheral edema noted.       Diagnostic Review:  Last metabolic panel Lab Results  Component Value Date   GLUCOSE 214 (H) 02/23/2024   NA 134 02/23/2024   K 4.4 02/23/2024   CL 95 (L) 02/23/2024   CO2 23 02/23/2024   BUN 21 02/23/2024   CREATININE 0.66 02/23/2024   EGFR 102 02/23/2024   CALCIUM  10.0 02/23/2024   PROT 7.1 02/23/2024   ALBUMIN 4.5 02/23/2024   LABGLOB 2.6 02/23/2024   BILITOT 0.4 02/23/2024   ALKPHOS 102 02/23/2024   AST 19 02/23/2024   ALT 21 02/23/2024         Assessment & Plan:   Assessment & Plan OSA (obstructive sleep apnea)     Morbid obesity (HCC)     Mild reactive airways disease, unspecified whether persistent     Mild intermittent asthma without complication        Assessment and Plan    Reactive airway disease with intermittent asthma Intermittent asthma exacerbated by illness, presenting with cough and dyspnea. Oxygen saturation at 94%. Prednisone  not recommended due to hyperglycemia risk. may need to prescribe it if not feeling better in future.  - Prescribed Symbicort  inhaler, two puffs twice daily. - Prescribed albuterol  inhaler for use as needed for dyspnea. - Instructed to rinse mouth after using Symbicort  to prevent thrush. - robitussin cough syrup and may continue tessalon  perles.  - Advised to contact office if symptoms worsen or if prednisone  becomes necessary. - Scheduled follow-up appointment in one month.  Obstructive sleep apnea Mild obstructive sleep apnea well-controlled with CPAP therapy. Reports improved sleep quality and reduced nocturnal awakenings. - Continue CPAP therapy. - Ensure regular cleaning of CPAP machine with mild soap and purified water.  Notes from PCP, urgent care and previous Dr. Saundra note reviewed as to gather relevant  information for patient care and formulating plan.  She was counselled about not driving while drowsy which is common side effect of sleep related disorders.   No follow-ups on file.   I personally spent a total of 40 minutes in the care of the patient today including preparing to see the patient, getting/reviewing separately obtained history, performing a medically appropriate exam/evaluation, counseling and educating, placing orders, documenting clinical information in the EHR, independently interpreting results, and communicating results.   Sammi Fredericks, MD     [1]  Current Outpatient Medications:    albuterol  (VENTOLIN  HFA) 108 (90 Base) MCG/ACT inhaler, Inhale 2 puffs into the lungs every 6 (six) hours as needed for wheezing or shortness of breath., Disp: 8 g, Rfl: 6   amoxicillin -clavulanate (AUGMENTIN ) 875-125 MG tablet, Take 1 tablet by mouth 2 (two) times daily for 10 days., Disp: 20 tablet, Rfl: 0   atorvastatin  (LIPITOR) 40 MG tablet, Take 1 tablet (40 mg total) by mouth daily., Disp: 90 tablet, Rfl: 3   benzonatate  (TESSALON ) 200 MG capsule, Take 1 capsule (200 mg total) by mouth 3 (three) times daily as needed for up to 7 days., Disp: 40 capsule, Rfl: 0   budesonide -formoterol  (SYMBICORT ) 160-4.5 MCG/ACT inhaler, Inhale 2 puffs into the lungs in the morning and at bedtime., Disp: 1 each, Rfl: 6   Continuous Glucose Sensor (FREESTYLE LIBRE 3 PLUS SENSOR) MISC, Change sensor every 15 days., Disp: 6 each, Rfl: 0   empagliflozin  (JARDIANCE ) 10 MG TABS tablet, Take 1 tablet (10 mg total) by mouth daily., Disp: 90 tablet, Rfl: 1   Galcanezumab -gnlm (EMGALITY ) 120 MG/ML SOAJ, Inject 120 mg into the skin every 30 (thirty) days., Disp: 3 mL, Rfl: 3   glucose blood (ONE TOUCH ULTRA TEST) test strip, USE AS INSTRUCTED, Disp: 100 each, Rfl: 1   guaiFENesin  (ROBITUSSIN) 100 MG/5ML liquid, Take 5 mLs by mouth every 4 (four) hours as needed for cough or to loosen phlegm., Disp: 120 mL, Rfl: 0    insulin  degludec (TRESIBA  FLEXTOUCH) 200 UNIT/ML FlexTouch Pen, Inject 22 Units into the skin daily. Total daily dose = 0.2-0.5 units/kg/day---> 50% Basal/50% Bolus, Disp: 9.9 mL, Rfl: 1   insulin  lispro (HUMALOG  KWIKPEN) 100 UNIT/ML KwikPen, Inject per sliding scale directions three times daily not to exceed 30 units daily, Disp: 3 mL, Rfl: 2   Multiple Vitamins-Minerals (MULTIVITAMIN WOMEN PO), Take by mouth., Disp: , Rfl:    Omega-3 Fatty Acids (FISH OIL PO), Take by mouth daily., Disp: , Rfl:    ondansetron  (ZOFRAN -ODT) 8 MG disintegrating tablet, Take 1 tablet (8 mg total) by mouth every 8 (eight) hours as needed for nausea or vomiting., Disp: 20 tablet, Rfl: 11   tirzepatide  (MOUNJARO ) 2.5 MG/0.5ML Pen, Inject 2.5 mg into the skin once a week., Disp: 6.5 mL, Rfl: 0   tiZANidine  (ZANAFLEX ) 4 MG tablet, Take 1 tablet (4 mg total) by mouth every 6 (six) hours as needed for muscle spasms., Disp: 30 tablet, Rfl: 6   traZODone  (DESYREL ) 50 MG tablet, Take 1-2 tablets (50-100 mg total) by mouth at bedtime., Disp: 180 tablet, Rfl: 3   Ubrogepant  (UBRELVY ) 100 MG TABS, Take 1 tablet (100 mg total) by mouth as needed. At onset of migraine headache, can repeat after 2 hours if needed, Disp: 16 tablet, Rfl: 11  "

## 2024-04-25 ENCOUNTER — Other Ambulatory Visit: Payer: Self-pay | Admitting: Urgent Care

## 2024-04-25 DIAGNOSIS — E1165 Type 2 diabetes mellitus with hyperglycemia: Secondary | ICD-10-CM

## 2024-06-13 ENCOUNTER — Ambulatory Visit: Admitting: Adult Health
